# Patient Record
Sex: Female | Born: 1970 | Race: White | Hispanic: No | Smoking: Former smoker
Health system: Southern US, Community
[De-identification: ages and names within clinical notes are randomized; demographics above are authoritative.]

## PROBLEM LIST (undated history)

## (undated) DIAGNOSIS — E785 Hyperlipidemia, unspecified: Secondary | ICD-10-CM

## (undated) DIAGNOSIS — B019 Varicella without complication: Secondary | ICD-10-CM

## (undated) DIAGNOSIS — R112 Nausea with vomiting, unspecified: Secondary | ICD-10-CM

## (undated) DIAGNOSIS — B029 Zoster without complications: Secondary | ICD-10-CM

## (undated) DIAGNOSIS — G8929 Other chronic pain: Secondary | ICD-10-CM

## (undated) DIAGNOSIS — T7840XA Allergy, unspecified, initial encounter: Secondary | ICD-10-CM

## (undated) DIAGNOSIS — K219 Gastro-esophageal reflux disease without esophagitis: Secondary | ICD-10-CM

## (undated) DIAGNOSIS — Z9889 Other specified postprocedural states: Secondary | ICD-10-CM

## (undated) DIAGNOSIS — Z87442 Personal history of urinary calculi: Secondary | ICD-10-CM

## (undated) DIAGNOSIS — G4730 Sleep apnea, unspecified: Secondary | ICD-10-CM

## (undated) HISTORY — DX: Allergy, unspecified, initial encounter: T78.40XA

## (undated) HISTORY — DX: Gastro-esophageal reflux disease without esophagitis: K21.9

## (undated) HISTORY — DX: Personal history of urinary calculi: Z87.442

## (undated) HISTORY — DX: Varicella without complication: B01.9

## (undated) HISTORY — DX: Zoster without complications: B02.9

## (undated) HISTORY — PX: CHOLECYSTECTOMY: SHX55

## (undated) HISTORY — DX: Hyperlipidemia, unspecified: E78.5

## (undated) HISTORY — DX: Other chronic pain: G89.29

---
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
## 2001-01-25 HISTORY — PX: ABDOMINAL HYSTERECTOMY: SHX81

## 2006-01-25 ENCOUNTER — Ambulatory Visit: Payer: Self-pay | Admitting: Unknown Physician Specialty

## 2006-11-15 ENCOUNTER — Ambulatory Visit: Payer: Self-pay | Admitting: Unknown Physician Specialty

## 2007-01-31 LAB — HM PAP SMEAR

## 2007-03-20 LAB — BASIC METABOLIC PANEL
Potassium: 4.1 mmol/L (ref 3.4–5.3)
Sodium: 140 mmol/L (ref 137–147)

## 2007-03-20 LAB — TSH: TSH: 1.68 u[IU]/mL (ref 0.41–5.90)

## 2007-04-13 ENCOUNTER — Ambulatory Visit: Payer: Self-pay | Admitting: Internal Medicine

## 2007-04-20 ENCOUNTER — Ambulatory Visit: Payer: Self-pay | Admitting: Internal Medicine

## 2007-05-25 ENCOUNTER — Ambulatory Visit: Payer: Self-pay | Admitting: General Surgery

## 2007-12-27 ENCOUNTER — Ambulatory Visit: Payer: Self-pay | Admitting: General Surgery

## 2008-07-11 ENCOUNTER — Ambulatory Visit: Payer: Self-pay | Admitting: General Surgery

## 2009-07-28 ENCOUNTER — Ambulatory Visit: Payer: Self-pay | Admitting: Cardiology

## 2010-09-15 ENCOUNTER — Ambulatory Visit: Payer: Self-pay | Admitting: Pain Medicine

## 2010-10-14 ENCOUNTER — Ambulatory Visit: Payer: Self-pay | Admitting: Pain Medicine

## 2011-12-23 ENCOUNTER — Ambulatory Visit: Payer: Self-pay | Admitting: Family Medicine

## 2012-12-10 ENCOUNTER — Other Ambulatory Visit: Payer: Self-pay | Admitting: Physician Assistant

## 2012-12-10 LAB — HEPATIC FUNCTION PANEL A (ARMC)
Bilirubin,Total: 0.3 mg/dL (ref 0.2–1.0)
SGPT (ALT): 18 U/L (ref 12–78)

## 2012-12-21 ENCOUNTER — Ambulatory Visit: Payer: Self-pay | Admitting: Internal Medicine

## 2012-12-26 ENCOUNTER — Ambulatory Visit: Payer: Self-pay | Admitting: Internal Medicine

## 2013-02-28 ENCOUNTER — Ambulatory Visit: Payer: Self-pay | Admitting: Family Medicine

## 2013-02-28 LAB — HM MAMMOGRAPHY

## 2013-05-10 ENCOUNTER — Ambulatory Visit: Payer: Self-pay | Admitting: Physician Assistant

## 2013-05-10 ENCOUNTER — Ambulatory Visit: Payer: Self-pay | Admitting: General Practice

## 2013-05-20 ENCOUNTER — Ambulatory Visit: Payer: Self-pay | Admitting: Urology

## 2013-05-29 ENCOUNTER — Ambulatory Visit: Payer: Self-pay | Admitting: Urology

## 2013-05-30 ENCOUNTER — Ambulatory Visit: Payer: Self-pay | Admitting: Urology

## 2013-06-05 ENCOUNTER — Ambulatory Visit: Payer: Self-pay | Admitting: Urology

## 2014-03-10 ENCOUNTER — Ambulatory Visit: Payer: Self-pay | Admitting: Pain Medicine

## 2014-03-11 ENCOUNTER — Ambulatory Visit: Payer: Self-pay | Admitting: Pain Medicine

## 2014-03-11 LAB — BASIC METABOLIC PANEL
ANION GAP: 9 (ref 7–16)
BUN: 18 mg/dL (ref 7–18)
CO2: 25 mmol/L (ref 21–32)
Calcium, Total: 8.8 mg/dL (ref 8.5–10.1)
Chloride: 107 mmol/L (ref 98–107)
Creatinine: 0.76 mg/dL (ref 0.60–1.30)
EGFR (Non-African Amer.): >60
Glucose: 83 mg/dL (ref 65–99)
Osmolality: 282 (ref 275–301)
POTASSIUM: 4 mmol/L (ref 3.5–5.1)
SODIUM: 141 mmol/L (ref 136–145)

## 2014-03-11 LAB — HEPATIC FUNCTION PANEL A (ARMC)
AST: 10 U/L — AB (ref 15–37)
Albumin: 3.3 g/dL — ABNORMAL LOW (ref 3.4–5.0)
Alkaline Phosphatase: 71 U/L
BILIRUBIN DIRECT: 0.1 mg/dL (ref 0.00–0.20)
BILIRUBIN TOTAL: 0.4 mg/dL (ref 0.2–1.0)
SGPT (ALT): 18 U/L
TOTAL PROTEIN: 6.4 g/dL (ref 6.4–8.2)

## 2014-03-11 LAB — SEDIMENTATION RATE: ERYTHROCYTE SED RATE: 4 mm/h (ref 0–20)

## 2014-03-11 LAB — MAGNESIUM: Magnesium: 1.8 mg/dL

## 2014-04-03 ENCOUNTER — Ambulatory Visit: Payer: Self-pay | Admitting: Pain Medicine

## 2014-06-27 HISTORY — PX: LITHOTRIPSY: SUR834

## 2014-09-24 ENCOUNTER — Encounter: Payer: Self-pay | Admitting: *Deleted

## 2014-10-07 ENCOUNTER — Telehealth: Payer: Self-pay | Admitting: *Deleted

## 2014-10-07 NOTE — Telephone Encounter (Signed)
See message.

## 2015-07-02 ENCOUNTER — Ambulatory Visit: Payer: Self-pay | Admitting: Physician Assistant

## 2015-07-02 ENCOUNTER — Encounter: Payer: Self-pay | Admitting: Physician Assistant

## 2015-07-02 VITALS — BP 122/90 | HR 84 | Temp 97.9°F

## 2015-07-02 DIAGNOSIS — J018 Other acute sinusitis: Secondary | ICD-10-CM

## 2015-07-02 MED ORDER — AMOXICILLIN-POT CLAVULANATE 875-125 MG PO TABS: 1.0000 | ORAL_TABLET | Freq: Two times a day (BID) | ORAL | Status: DC

## 2015-07-02 MED ORDER — PREDNISONE 10 MG PO TABS: 30.0000 mg | ORAL_TABLET | Freq: Every day | ORAL | Status: DC

## 2015-07-02 MED ORDER — FLUCONAZOLE 150 MG PO TABS: ORAL_TABLET | ORAL | Status: DC

## 2015-07-02 NOTE — Progress Notes (Signed)
S: C/o runny nose and congestion for 7 days, no fever, chills, cp/sob, v/d; mucus is green and thick, cough is sporadic, c/o of facial and dental pain. Has some pain on left side, a lot of pressure in face  Using otc meds:   O: PE: vitals wnl, nad, perrl eomi, normocephalic, tms dull, nasal mucosa red and swollen, throat injected, neck supple no lymph, lungs c t a, cv rrr, neuro intact  A:  Acute sinusitis   P: augmentin 875mg  bid , diflucan, prednisone 30mg  qd x 3d, drink fluids, continue regular meds , use otc meds of choice, return if not improving in 5 days, return earlier if worsening

## 2015-08-28 ENCOUNTER — Ambulatory Visit: Payer: Self-pay | Admitting: Nurse Practitioner

## 2015-09-10 ENCOUNTER — Encounter: Payer: Self-pay | Admitting: Nurse Practitioner

## 2015-09-10 ENCOUNTER — Ambulatory Visit (INDEPENDENT_AMBULATORY_CARE_PROVIDER_SITE_OTHER): Payer: 59 | Admitting: Nurse Practitioner

## 2015-09-10 VITALS — BP 110/70 | HR 90 | Temp 98.2°F | Ht 62.0 in | Wt 236.0 lb

## 2015-09-10 DIAGNOSIS — Z9189 Other specified personal risk factors, not elsewhere classified: Secondary | ICD-10-CM

## 2015-09-10 DIAGNOSIS — N200 Calculus of kidney: Secondary | ICD-10-CM

## 2015-09-10 DIAGNOSIS — Z7189 Other specified counseling: Secondary | ICD-10-CM

## 2015-09-10 DIAGNOSIS — Z1239 Encounter for other screening for malignant neoplasm of breast: Secondary | ICD-10-CM

## 2015-09-10 DIAGNOSIS — Z7689 Persons encountering health services in other specified circumstances: Secondary | ICD-10-CM

## 2015-09-10 DIAGNOSIS — K219 Gastro-esophageal reflux disease without esophagitis: Secondary | ICD-10-CM

## 2015-09-10 NOTE — Progress Notes (Signed)
Pre visit review using our clinic review tool, if applicable. No additional management support is needed unless otherwise documented below in the visit note. 

## 2015-09-10 NOTE — Patient Instructions (Addendum)
Welcome to [facility]! Nice to meet you.  See you for physical in May.   [facility]  Address: [address] [place], [place] [address]  Phone: [phone] Hours:  Monday - Thursday: 8 a.m. - 5 p.m. Friday: 8 a.m. - 3 p.m.

## 2015-09-10 NOTE — Progress Notes (Signed)
Patient ID: Sarah [name], female    DOB: [birth year]  Age: 45 y.o. MRN: [id number]  CC: Establish Care   HPI Sarah [name] presents for Establishing care and CC of wanting to get set up for mammogram and colonoscopy.  1) New Pt Info:   Immunizations- unsure of tdap, 03/2015 flu  Mammogram- 2015   Pap- 2015 at [facility] Normal   2) Chronic Problems-   Obesity- lost 10 lbs on her own   2 miles at work being active    Food- cut out fried foods, decrease carbs   Nephrolithiasis- 2014   GERD- Zantac OTC   3) Acute Problems-  Improved sinus infection  Working on weight loss   Needs colonoscopy- cramping next morning after eating dinner and family h/o colon cancer (father)    History Merica has a past medical history of Chicken pox; Allergy; GERD (gastroesophageal reflux disease); Shingles; and History of kidney stones.   She has past surgical history that includes Cesarean section (12/1987); Cesarean section (05/1993); and Abdominal hysterectomy (01/2001).   Her family history includes Breast cancer in her maternal grandmother and mother; Colon cancer in her father; Diabetes in her father; Hypertension in her paternal grandfather; Lung cancer in her father.She reports that she quit smoking about 5 years ago. She has never used smokeless tobacco. She reports that she drinks alcohol. She reports that she does not use illicit drugs.  Outpatient Prescriptions Prior to Visit  Medication Sig Dispense Refill  . amoxicillin-clavulanate (AUGMENTIN) 875-125 MG tablet Take 1 tablet by mouth 2 (two) times daily. 20 tablet 0  . fluconazole (DIFLUCAN) 150 MG tablet Use one now and one in a week 2 tablet 0  . predniSONE (DELTASONE) 10 MG tablet Take 3 tablets (30 mg total) by mouth daily with breakfast. 9 tablet 0   No facility-administered medications prior to visit.    ROS Review of Systems  Constitutional: Negative for fever, chills, diaphoresis and fatigue.  Gastrointestinal: Positive for  abdominal pain. Negative for nausea, vomiting, diarrhea, constipation, blood in stool, abdominal distention, anal bleeding and rectal pain.    Objective:  BP 110/70 mmHg  Pulse 90  Temp(Src) 98.2 F (36.8 C) (Oral)  Ht 5\' 2"  (1.575 m)  Wt 236 lb (107.049 kg)  BMI 43.15 kg/m2  SpO2 98%  Physical Exam  Constitutional: She is oriented to person, place, and time. She appears well-developed and well-nourished. No distress.  HENT:  Head: Normocephalic and atraumatic.  Right Ear: External ear normal.  Left Ear: External ear normal.  Mouth/Throat: Oropharynx is clear and moist. No oropharyngeal exudate.  Eyes: EOM are normal. Pupils are equal, round, and reactive to light. Right eye exhibits no discharge. Left eye exhibits no discharge. No scleral icterus.  Neck: Normal range of motion. Neck supple.  Cardiovascular: Normal rate and regular rhythm.   Pulmonary/Chest: Effort normal and breath sounds normal. No respiratory distress. She has no wheezes. She has no rales. She exhibits no tenderness.  Abdominal: Soft. Bowel sounds are normal. She exhibits no distension and no mass. There is no tenderness. There is no rebound and no guarding.  Neurological: She is alert and oriented to person, place, and time.  Skin: Skin is warm and dry. No rash noted. She is not diaphoretic.  Psychiatric: She has a normal mood and affect. Her behavior is normal. Judgment and thought content normal.   Assessment & Plan:   Jeanni was seen today for establish care.  Diagnoses and all orders for this  visit:  High risk for colon cancer -     Ambulatory referral to Gastroenterology  Screening for breast cancer -     Cancel: MM Digital Screening; Future  Calculus of kidney  Morbid obesity due to excess calories ([place])  Gastroesophageal reflux disease without esophagitis   I have discontinued Ms. Godino's amoxicillin-clavulanate, predniSONE, and fluconazole. I am also having her maintain her loratadine,  ranitidine, magnesium oxide, b complex vitamins, Echinacea, and Melatonin.  Meds ordered this encounter  Medications  . loratadine (CLARITIN) 10 MG tablet    Sig: Take 10 mg by mouth daily.  . ranitidine (ZANTAC) 150 MG capsule    Sig: Take 150 mg by mouth 2 (two) times daily.  . magnesium oxide (MAG-OX) 400 MG tablet    Sig: Take 400 mg by mouth daily.  Marland Kitchen b complex vitamins tablet    Sig: Take 1 tablet by mouth daily.  . Echinacea 400 MG CAPS    Sig: Take 1 capsule by mouth 3 (three) times daily.  . Melatonin 3 MG TABS    Sig: Take 1 tablet by mouth daily.     Follow-up: Return in about 2 months (around 11/12/2015) for CPE.

## 2015-09-11 ENCOUNTER — Encounter: Payer: Self-pay | Admitting: Nurse Practitioner

## 2015-09-18 ENCOUNTER — Ambulatory Visit
Admission: RE | Admit: 2015-09-18 | Discharge: 2015-09-18 | Disposition: A | Discharge status: Home / Self Care | Payer: 59 | Source: Ambulatory Visit | Attending: Nurse Practitioner | Admitting: Nurse Practitioner

## 2015-09-18 ENCOUNTER — Other Ambulatory Visit: Payer: Self-pay | Admitting: Nurse Practitioner

## 2015-09-18 DIAGNOSIS — Z1231 Encounter for screening mammogram for malignant neoplasm of breast: Secondary | ICD-10-CM | POA: Insufficient documentation

## 2015-09-18 DIAGNOSIS — Z1239 Encounter for other screening for malignant neoplasm of breast: Secondary | ICD-10-CM

## 2015-09-20 DIAGNOSIS — K219 Gastro-esophageal reflux disease without esophagitis: Secondary | ICD-10-CM | POA: Insufficient documentation

## 2015-09-20 DIAGNOSIS — Z7689 Persons encountering health services in other specified circumstances: Secondary | ICD-10-CM | POA: Insufficient documentation

## 2015-09-20 HISTORY — DX: Morbid (severe) obesity due to excess calories: E66.01

## 2015-09-20 NOTE — Assessment & Plan Note (Signed)
Mammogram ordered  Info given to pt to call and schedule

## 2015-09-20 NOTE — Assessment & Plan Note (Signed)
Stable currently on zantac otc prn

## 2015-09-20 NOTE — Assessment & Plan Note (Signed)
Pt referred to GI- father had colon cancer pt reports bowel concerns with increased abdominal cramping

## 2015-09-20 NOTE — Assessment & Plan Note (Signed)
Wt Readings from Last 3 Encounters:  09/10/15 236 lb (107.049 kg)   Pt lost 10 lbs recently  She is working on being active and cutting down food intake/carbs/fried foods.  Congratulated her and encouraged her to continue

## 2015-09-20 NOTE — Assessment & Plan Note (Signed)
Discussed acute and chronic issues. Reviewed health maintenance measures, PFSHx, and immunizations. Obtain records from previous facility.   

## 2015-09-20 NOTE — Assessment & Plan Note (Signed)
1 episode in 2014 Advised lemon in drinks and watch sweet tea and sodas

## 2015-09-21 ENCOUNTER — Encounter: Payer: Self-pay | Admitting: Gastroenterology

## 2015-09-21 ENCOUNTER — Encounter: Payer: Self-pay | Admitting: Nurse Practitioner

## 2015-10-28 ENCOUNTER — Encounter: Payer: Self-pay | Admitting: Nurse Practitioner

## 2015-11-17 ENCOUNTER — Ambulatory Visit: Payer: 59 | Attending: Pain Medicine | Admitting: Pain Medicine

## 2015-11-17 ENCOUNTER — Encounter: Payer: Self-pay | Admitting: Pain Medicine

## 2015-11-17 VITALS — BP 128/83 | HR 82 | Temp 98.2°F | Resp 16 | Ht 61.0 in | Wt 200.0 lb

## 2015-11-17 DIAGNOSIS — K219 Gastro-esophageal reflux disease without esophagitis: Secondary | ICD-10-CM | POA: Insufficient documentation

## 2015-11-17 DIAGNOSIS — M25562 Pain in left knee: Secondary | ICD-10-CM | POA: Diagnosis not present

## 2015-11-17 DIAGNOSIS — G8929 Other chronic pain: Secondary | ICD-10-CM | POA: Insufficient documentation

## 2015-11-17 DIAGNOSIS — N200 Calculus of kidney: Secondary | ICD-10-CM | POA: Insufficient documentation

## 2015-11-17 DIAGNOSIS — M7062 Trochanteric bursitis, left hip: Secondary | ICD-10-CM

## 2015-11-17 DIAGNOSIS — M25559 Pain in unspecified hip: Secondary | ICD-10-CM | POA: Diagnosis present

## 2015-11-17 DIAGNOSIS — M25552 Pain in left hip: Secondary | ICD-10-CM | POA: Diagnosis not present

## 2015-11-17 HISTORY — DX: Other chronic pain: G89.29

## 2015-11-17 MED ORDER — METHYLPREDNISOLONE ACETATE 40 MG/ML IJ SUSP
INTRAMUSCULAR | Status: AC
  Filled 2015-11-17: qty 1

## 2015-11-17 MED ORDER — LIDOCAINE HCL (PF) 1 % IJ SOLN: 10.0000 mL | Freq: Once | INTRAMUSCULAR | Status: DC

## 2015-11-17 MED ORDER — ROPIVACAINE HCL 2 MG/ML IJ SOLN
INTRAMUSCULAR | Status: AC
  Filled 2015-11-17: qty 10

## 2015-11-17 MED ORDER — METHYLPREDNISOLONE ACETATE 80 MG/ML IJ SUSP
80.0000 mg | Freq: Once | INTRAMUSCULAR | Status: AC
  Administered 2015-11-17: 80 mg
  Filled 2015-11-17: qty 1

## 2015-11-17 MED ORDER — ROPIVACAINE HCL 2 MG/ML IJ SOLN
5.0000 mL | Freq: Once | INTRAMUSCULAR | Status: AC
  Administered 2015-11-17: 5 mL via EPIDURAL
  Filled 2015-11-17: qty 10

## 2015-11-17 MED ORDER — LIDOCAINE HCL (PF) 1 % IJ SOLN
INTRAMUSCULAR | Status: AC
  Filled 2015-11-17: qty 5

## 2015-11-17 NOTE — Patient Instructions (Signed)

## 2015-11-17 NOTE — Progress Notes (Signed)
Patient's Name: Sarah [name]  Patient type: Established  MRN: [id number]  Service setting: Ambulatory outpatient  DOB: October 04, 1970  Location: [facility]  DOS: 11/17/2015  Primary Care Physician: Wynona Dove, MD  Note by: Sydnee Levans. Laban Emperor, M.D, DABA, DABAPM, DABPM, DABIPP, FIPP  Referring Physician: Shelia Media, MD  Specialty: Board-Certified Interventional Pain Management  Last Visit to Pain Management: Visit date not found   Primary Reason(s) for Visit: Interventional Pain Management Treatment. CC: Hip Pain  Primary Diagnosis: Trochanteric bursitis of left hip [M70.62]   Procedure:  Anesthesia, Analgesia, Anxiolysis:  Type: Diagnostic Greater Trochanteric Bursa Injection Region: Greater Femoral Trochanter Region Level: Hip Joint Laterality: Left  Indications: 1. Trochanteric bursitis of left hip   2. Left knee pain     Pre-procedure Pain Score: 4/10 Reported level of pain is compatible with clinical observations Post-procedure Pain Score: 4   Type: Local Anesthesia Local Anesthetic: Lidocaine 1% Route: Infiltration ([place]/IM) IV Access: Declined Sedation: Declined  Indication(s): Analgesia     Pre-Procedure Assessment:  Sarah [name] is a 45 y.o. year old, female patient, seen today for interventional treatment. She has Calculus of kidney; High risk for colon cancer; Screening for breast cancer; Morbid obesity (HCC); GERD (gastroesophageal reflux disease); Encounter to establish care; Trochanteric bursitis of left hip; and Left knee pain on her problem list.. Her primarily concern today is the Hip Pain   Pain Type: Acute pain Pain Location: Hip Pain Orientation: Left Pain Descriptors / Indicators: Aching, Discomfort, Radiating, Heaviness Pain Frequency: Intermittent    Service Provided on Last Visit: Evaluation  Verification of the correct person, correct site (including marking of site), and correct procedure were performed  and confirmed by the patient.  Consent: Secured. Under the influence of no sedatives a written informed consent was obtained, after having provided information on the risks and possible complications. To fulfill our ethical and legal obligations, as recommended by the American Medical Association's Code of Ethics, we have provided information to the patient about our clinical impression; the nature and purpose of the treatment or procedure; the risks, benefits, and possible complications of the intervention; alternatives; the risk(s) and benefit(s) of the alternative treatment(s) or procedure(s); and the risk(s) and benefit(s) of doing nothing. The patient was provided information about the risks and possible complications associated with the procedure. These include, but are not limited to, failure to achieve desired goals, infection, bleeding, organ or nerve damage, allergic reactions, paralysis, and death. In the case of intra- or periarticular procedures these may include, but are not limited to, failure to achieve desired goals, infection, bleeding (hemarthrosis), organ or nerve damage, allergic reactions, and death. In addition, the patient was informed that Medicine is not an exact science; therefore, there is also the possibility of unforeseen risks and possible complications that may result in a catastrophic outcome. The patient indicated having understood very clearly. We have given the patient no guarantees and we have made no promises. Enough time was given to the patient to ask questions, all of which were answered to the patient's satisfaction.  Consent Attestation: I, the ordering provider, attest that I have discussed with the patient the benefits, risks, side-effects, alternatives, likelihood of achieving goals, and potential problems during recovery for the procedure that I have provided informed consent.  Pre-Procedure Preparation: Safety Precautions: Allergies reviewed. Appropriate site,  procedure, and patient were confirmed by following the Joint Commission's Universal Protocol (UP.01.01.01), in the form of a "Time Out". The patient was asked to  confirm marked site and procedure, before commencing. The patient was asked about blood thinners, or active infections, both of which were denied. Patient was assessed for positional comfort and all pressure points were checked before starting procedure. Allergies: She is allergic to aspirin and sulfa antibiotics.. Infection Control Precautions: Sterile technique used. Standard Universal Precautions were taken as recommended by the Department of [facility] for Disease Control and Prevention (CDC). Standard pre-surgical skin prep was conducted. Respiratory hygiene and cough etiquette was practiced. Hand hygiene observed. Safe injection practices and needle disposal techniques followed. SDV (single dose vial) medications used. Medications properly checked for expiration dates and contaminants. Personal protective equipment (PPE) used: Sterile Radiation-resistant gloves. Monitoring:  As per clinic protocol. Filed Vitals:   11/17/15 0949 11/17/15 0953 11/17/15 0958 11/17/15 1003  BP: 130/82 120/82 126/87 128/83  Pulse: 78 81 71 82  Temp:      TempSrc:      Resp: '16 16 16 16  '$ Height:      Weight:      SpO2: 98% 99% 100% 100%  Calculated BMI: Body mass index is 37.81 kg/(m^2).  Description of Procedure Process:  Time-out: "Time-out" completed before starting procedure, as per protocol. Position: Prone Target Area: Greater Trochanteric Bursa of . Approach: Posterolateral approach. Area Prepped: Entire Posterolateral Hip Area Prepping solution: ChloraPrep (2% chlorhexidine gluconate and 70% isopropyl alcohol) Safety Precautions: Aspiration looking for blood return was conducted prior to all injections. At no point did we inject any substances, as a needle was being advanced. No attempts were made at seeking any paresthesias. Safe  injection practices and needle disposal techniques used. Medications properly checked for expiration dates. SDV (single dose vial) medications used.   Description of the Procedure: Protocol guidelines were followed. The patient was placed in position over the procedure table. The target area was identified and the area prepped in the usual manner. Skin desensitized using vapocoolant spray. Skin & deeper tissues infiltrated with local anesthetic. Appropriate amount of time allowed to pass for local anesthetics to take effect. The procedure needles were then advanced to the target area. Proper needle placement secured. Negative aspiration confirmed. Solution injected in intermittent fashion, asking for systemic symptoms every 0.5cc of injectate. The needles were then removed and the area cleansed, making sure to leave some of the prepping solution back to take advantage of its long term bactericidal properties. EBL: None Materials & Medications Used:  Needle(s) Used: 22g - 7" Spinal Needle(s) Solution Injected: 0.2% PF-Ropivacaine (64m) + SDV-DepoMedrol 80 mg/ml (127m Medications Administered today: We administered methylPREDNISolone acetate and ropivacaine (PF) 2 mg/ml (0.2%).Please see chart orders for dosing details.  Imaging Guidance:  Type of Imaging Technique: Fluoroscopy Guidance (Non-spinal) Indication(s): Assistance in needle guidance and placement for procedures requiring needle placement in or near specific anatomical locations not easily accessible without such assistance. Exposure Time: Please see nurses notes. Contrast: None used. Fluoroscopic Guidance: I was personally present in the fluoroscopy suite, where the patient was placed in position for the procedure, over the fluoroscopy-compatible table. Fluoroscopy was manipulated to obtain the best possible view of the target area, on the affected side. A "direction-depth-direction" technique was used to introduce the needle under continuous  pulsed fluoroscopic guidance. Once the target was reached, antero-posterior, oblique, and lateral fluoroscopic projection views were taken to confirm needle placement in all planes. Permanently recorded images stored by scanning into EMR. Interpretation: Intraoperative imaging interpretation by performing Physician. Adequate needle placement confirmed. Permanent hardcopy images in multiple planes scanned into  the patient's record.  Antibiotic Prophylaxis:  Indication(s): No indications identified. Type:  Antibiotics Given (last 72 hours)    None        Post-operative Assessment:  Complications: No immediate post-treatment complications were observed. Disposition: The patient was discharged home, once institutional criteria were met. Return to clinic in 2 weeks for follow-up evaluation and interpretation of results. The patient tolerated the entire procedure well. A repeat set of vitals were taken after the procedure and the patient was kept under observation following institutional policy, for this type of procedure. Post-procedural neurological assessment was performed, showing return to baseline, prior to discharge. The patient was provided with post-procedure discharge instructions, including a section on how to identify potential problems. Should any problems arise concerning this procedure, the patient was given instructions to immediately contact us, at any time, without hesitation. In any case, we plan to contact the patient by telephone for a follow-up status report regarding this interventional procedure. Comments:  No additional relevant information.  Medications administered during this visit: We administered methylPREDNISolone acetate and ropivacaine (PF) 2 mg/ml (0.2%).  Prescriptions ordered during this visit: New Prescriptions   No medications on file    Future Appointments Date Time Provider [place]  12/07/2015 2:30 PM Jackolyn Confer, MD [facility] None     Primary Care Physician: Rica Mast, MD Location: [facility] Note by: Kathlen Brunswick. Dossie Arbour, M.D, DABA, DABAPM, DABPM, DABIPP, FIPP  Disclaimer:  Medicine is not an exact science. The only guarantee in medicine is that nothing is guaranteed. It is important to note that the decision to proceed with this intervention was based on the information collected from the patient. The Data and conclusions were drawn from the patient's questionnaire, the interview, and the physical examination. Because the information was provided in large part by the patient, it cannot be guaranteed that it has not been purposely or unconsciously manipulated. Every effort has been made to obtain as much relevant data as possible for this evaluation. It is important to note that the conclusions that lead to this procedure are derived in large part from the available data. Always take into account that the treatment will also be dependent on availability of resources and existing treatment guidelines, considered by other Pain Management Practitioners as being common knowledge and practice, at the time of the intervention. For Medico-Legal purposes, it is also important to point out that variation in procedural techniques and pharmacological choices are the acceptable norm. The indications, contraindications, technique, and results of the above procedure should only be interpreted and judged by a Board-Certified Interventional Pain Specialist with extensive familiarity and expertise in the same exact procedure and technique. Attempts at providing opinions without similar or greater experience and expertise than that of the treating physician will be considered as inappropriate and unethical, and shall result in a formal complaint to the state medical board and applicable specialty societies.

## 2015-11-18 ENCOUNTER — Telehealth: Payer: Self-pay | Admitting: *Deleted

## 2015-11-18 NOTE — Telephone Encounter (Signed)
Spoke with patient re; procedures on yesterday, denies any questions or concerns.

## 2015-11-19 ENCOUNTER — Encounter: Payer: Self-pay | Admitting: Nurse Practitioner

## 2015-11-20 ENCOUNTER — Ambulatory Visit: Payer: Self-pay | Admitting: Gastroenterology

## 2015-11-24 DIAGNOSIS — Z800 Family history of malignant neoplasm of digestive organs: Secondary | ICD-10-CM | POA: Diagnosis not present

## 2015-11-24 DIAGNOSIS — Z1211 Encounter for screening for malignant neoplasm of colon: Secondary | ICD-10-CM | POA: Diagnosis not present

## 2015-12-07 ENCOUNTER — Ambulatory Visit (INDEPENDENT_AMBULATORY_CARE_PROVIDER_SITE_OTHER): Payer: 59 | Admitting: Internal Medicine

## 2015-12-07 ENCOUNTER — Encounter: Payer: Self-pay | Admitting: Internal Medicine

## 2015-12-07 VITALS — BP 106/70 | HR 97 | Ht 61.0 in | Wt 236.0 lb

## 2015-12-07 DIAGNOSIS — Z0000 Encounter for general adult medical examination without abnormal findings: Secondary | ICD-10-CM | POA: Diagnosis not present

## 2015-12-07 NOTE — Progress Notes (Signed)
Subjective:    Patient ID: Sarah [name], female    DOB: September 07, 1970, 45 y.o.   MRN: [id number]  HPI  45YO female presents for physical and to establish care.  Scheduled with Dr. Tiffany Kocher for colonoscopy. Feeling well. No concerns today. Trying to get back on track with diet and lose weight.  Wt Readings from Last 3 Encounters:  12/07/15 236 lb (107.049 kg)  11/17/15 200 lb (90.719 kg)  09/10/15 236 lb (107.049 kg)   BP Readings from Last 3 Encounters:  12/07/15 106/70  11/17/15 128/83  09/10/15 110/70    Past Medical History  Diagnosis Date  . Chicken pox   . Allergy   . GERD (gastroesophageal reflux disease)   . Shingles   . History of kidney stones    Family History  Problem Relation Age of Onset  . Breast cancer Mother   . Colon cancer Father   . Lung cancer Father   . Diabetes Father   . Hypertension Paternal Grandfather   . Breast cancer Maternal Grandmother    Past Surgical History  Procedure Laterality Date  . Cesarean section  12/1987  . Cesarean section  05/1993  . Abdominal hysterectomy  01/2001    partial   Social History   Social History  . Marital Status: Married    Spouse Name: N/A  . Number of Children: N/A  . Years of Education: N/A   Social History Main Topics  . Smoking status: Former Smoker    Quit date: 06/27/2010  . Smokeless tobacco: Never Used     Comment: quit 2015---currently using e cig  . Alcohol Use: 0.0 oz/week    0 Standard drinks or equivalent per week     Comment: occasional  . Drug Use: No  . Sexual Activity:    Partners: Male    Birth Control/ Protection: Surgical     Comment: Husband   Other Topics Concern  . None   Social History Narrative   Married   Employed as a Chartered certified accountant at [employer]   HS education    2 children (daughter 31 and son 58)    Caffeine- Coffee and tea 4-5 cups daily     Review of Systems  Constitutional: Negative for fever, chills, appetite change, fatigue and unexpected  weight change.  Eyes: Negative for visual disturbance.  Respiratory: Negative for shortness of breath.   Cardiovascular: Negative for chest pain and leg swelling.  Gastrointestinal: Negative for nausea, vomiting, abdominal pain, diarrhea and constipation.  Skin: Negative for color change and rash.  Hematological: Negative for adenopathy. Does not bruise/bleed easily.  Psychiatric/Behavioral: Negative for dysphoric mood. The patient is not nervous/anxious.        Objective:    BP 106/70 mmHg  Pulse 97  Ht 5\' 1"  (1.549 m)  Wt 236 lb (107.049 kg)  BMI 44.61 kg/m2  SpO2 99% Physical Exam  Constitutional: She is oriented to person, place, and time. She appears well-developed and well-nourished. No distress.  HENT:  Head: Normocephalic and atraumatic.  Right Ear: External ear normal.  Left Ear: External ear normal.  Nose: Nose normal.  Mouth/Throat: Oropharynx is clear and moist. No oropharyngeal exudate.  Eyes: Conjunctivae are normal. Pupils are equal, round, and reactive to light. Right eye exhibits no discharge. Left eye exhibits no discharge. No scleral icterus.  Neck: Normal range of motion. Neck supple. No tracheal deviation present. No thyromegaly present.  Cardiovascular: Normal rate, regular rhythm, normal heart sounds and intact  distal pulses.  Exam reveals no gallop and no friction rub.   No murmur heard. Pulmonary/Chest: Effort normal and breath sounds normal. No accessory muscle usage. No tachypnea. No respiratory distress. She has no decreased breath sounds. She has no wheezes. She has no rales. She exhibits no tenderness. Right breast exhibits no inverted nipple, no mass, no nipple discharge, no skin change and no tenderness. Left breast exhibits no inverted nipple, no mass, no nipple discharge, no skin change and no tenderness. Breasts are symmetrical.  Abdominal: Soft. Bowel sounds are normal. She exhibits no distension and no mass. There is no tenderness. There is no  rebound and no guarding.  Musculoskeletal: Normal range of motion. She exhibits no edema or tenderness.  Lymphadenopathy:    She has no cervical adenopathy.  Neurological: She is alert and oriented to person, place, and time. No cranial nerve deficit. She exhibits normal muscle tone. Coordination normal.  Skin: Skin is warm and dry. No rash noted. She is not diaphoretic. No erythema. No pallor.  Psychiatric: She has a normal mood and affect. Her behavior is normal. Judgment and thought content normal.          Assessment & Plan:   Problem List Items Addressed This Visit      Unprioritized   Routine general medical examination at a health care facility - Primary    General medical exam normal today including breast exam. PAP and pelvic deferred as s/p hysterectomy. Mammogram reviewed.  Labs ordered. Encouraged healthy diet and exercise for weight loss. Discussed using Saxenda. She will look into coverage for this.      Relevant Orders   CBC with Differential/Platelet   Comprehensive metabolic panel   Lipid panel   VITAMIN D 25 Hydroxy (Vit-D Deficiency, Fractures)   TSH   Hemoglobin A1c       Return in about 1 year (around 12/06/2016) for Physical.  Ronette Deter, MD Internal Medicine [facility]

## 2015-12-07 NOTE — Assessment & Plan Note (Signed)
General medical exam normal today including breast exam. PAP and pelvic deferred as s/p hysterectomy. Mammogram reviewed.  Labs ordered. Encouraged healthy diet and exercise for weight loss. Discussed using Saxenda. She will look into coverage for this.

## 2015-12-07 NOTE — Progress Notes (Signed)
Pre visit review using our clinic review tool, if applicable. No additional management support is needed unless otherwise documented below in the visit note. 

## 2015-12-07 NOTE — Patient Instructions (Signed)
Health Maintenance, Female Adopting a healthy lifestyle and getting preventive care can go a long way to promote health and wellness. Talk with your health care provider about what schedule of regular examinations is right for you. This is a good chance for you to check in with your provider about disease prevention and staying healthy. In between checkups, there are plenty of things you can do on your own. Experts have done a lot of research about which lifestyle changes and preventive measures are most likely to keep you healthy. Ask your health care provider for more information. WEIGHT AND DIET  Eat a healthy diet  Be sure to include plenty of vegetables, fruits, low-fat dairy products, and lean protein.  Do not eat a lot of foods high in solid fats, added sugars, or salt.  Get regular exercise. This is one of the most important things you can do for your health.  Most adults should exercise for at least 150 minutes each week. The exercise should increase your heart rate and make you sweat (moderate-intensity exercise).  Most adults should also do strengthening exercises at least twice a week. This is in addition to the moderate-intensity exercise.  Maintain a healthy weight  Body mass index (BMI) is a measurement that can be used to identify possible weight problems. It estimates body fat based on height and weight. Your health care provider can help determine your BMI and help you achieve or maintain a healthy weight.  For females 20 years of age and older:   A BMI below 18.5 is considered underweight.  A BMI of 18.5 to 24.9 is normal.  A BMI of 25 to 29.9 is considered overweight.  A BMI of 30 and above is considered obese.  Watch levels of cholesterol and blood lipids  You should start having your blood tested for lipids and cholesterol at 47 years of age, then have this test every 5 years.  You may need to have your cholesterol levels checked more often if:  Your lipid  or cholesterol levels are high.  You are older than 47 years of age.  You are at high risk for heart disease.  CANCER SCREENING   Lung Cancer  Lung cancer screening is recommended for adults 55-80 years old who are at high risk for lung cancer because of a history of smoking.  A yearly low-dose CT scan of the lungs is recommended for people who:  Currently smoke.  Have quit within the past 15 years.  Have at least a 30-pack-year history of smoking. A pack year is smoking an average of one pack of cigarettes a day for 1 year.  Yearly screening should continue until it has been 15 years since you quit.  Yearly screening should stop if you develop a health problem that would prevent you from having lung cancer treatment.  Breast Cancer  Practice breast self-awareness. This means understanding how your breasts normally appear and feel.  It also means doing regular breast self-exams. Let your health care provider know about any changes, no matter how small.  If you are in your 20s or 30s, you should have a clinical breast exam (CBE) by a health care provider every 1-3 years as part of a regular health exam.  If you are 40 or older, have a CBE every year. Also consider having a breast X-ray (mammogram) every year.  If you have a family history of breast cancer, talk to your health care provider about genetic screening.  If you   are at high risk for breast cancer, talk to your health care provider about having an MRI and a mammogram every year.  Breast cancer gene (BRCA) assessment is recommended for women who have family members with BRCA-related cancers. BRCA-related cancers include:  Breast.  Ovarian.  Tubal.  Peritoneal cancers.  Results of the assessment will determine the need for genetic counseling and BRCA1 and BRCA2 testing. Cervical Cancer Your health care provider may recommend that you be screened regularly for cancer of the pelvic organs (ovaries, uterus, and  vagina). This screening involves a pelvic examination, including checking for microscopic changes to the surface of your cervix (Pap test). You may be encouraged to have this screening done every 3 years, beginning at age 21.  For women ages 30-65, health care providers may recommend pelvic exams and Pap testing every 3 years, or they may recommend the Pap and pelvic exam, combined with testing for human papilloma virus (HPV), every 5 years. Some types of HPV increase your risk of cervical cancer. Testing for HPV may also be done on women of any age with unclear Pap test results.  Other health care providers may not recommend any screening for nonpregnant women who are considered low risk for pelvic cancer and who do not have symptoms. Ask your health care provider if a screening pelvic exam is right for you.  If you have had past treatment for cervical cancer or a condition that could lead to cancer, you need Pap tests and screening for cancer for at least 20 years after your treatment. If Pap tests have been discontinued, your risk factors (such as having a new sexual partner) need to be reassessed to determine if screening should resume. Some women have medical problems that increase the chance of getting cervical cancer. In these cases, your health care provider may recommend more frequent screening and Pap tests. Colorectal Cancer  This type of cancer can be detected and often prevented.  Routine colorectal cancer screening usually begins at 47 years of age and continues through 47 years of age.  Your health care provider may recommend screening at an earlier age if you have risk factors for colon cancer.  Your health care provider may also recommend using home test kits to check for hidden blood in the stool.  A small camera at the end of a tube can be used to examine your colon directly (sigmoidoscopy or colonoscopy). This is done to check for the earliest forms of colorectal  cancer.  Routine screening usually begins at age 50.  Direct examination of the colon should be repeated every 5-10 years through 47 years of age. However, you may need to be screened more often if early forms of precancerous polyps or small growths are found. Skin Cancer  Check your skin from head to toe regularly.  Tell your health care provider about any new moles or changes in moles, especially if there is a change in a mole's shape or color.  Also tell your health care provider if you have a mole that is larger than the size of a pencil eraser.  Always use sunscreen. Apply sunscreen liberally and repeatedly throughout the day.  Protect yourself by wearing long sleeves, pants, a wide-brimmed hat, and sunglasses whenever you are outside. HEART DISEASE, DIABETES, AND HIGH BLOOD PRESSURE   High blood pressure causes heart disease and increases the risk of stroke. High blood pressure is more likely to develop in:  People who have blood pressure in the high end   of the normal range (130-139/85-89 mm Hg).  People who are overweight or obese.  People who are African American.  If you are 38-23 years of age, have your blood pressure checked every 3-5 years. If you are 61 years of age or older, have your blood pressure checked every year. You should have your blood pressure measured twice--once when you are at a hospital or clinic, and once when you are not at a hospital or clinic. Record the average of the two measurements. To check your blood pressure when you are not at a hospital or clinic, you can use:  An automated blood pressure machine at a pharmacy.  A home blood pressure monitor.  If you are between 45 years and 39 years old, ask your health care provider if you should take aspirin to prevent strokes.  Have regular diabetes screenings. This involves taking a blood sample to check your fasting blood sugar level.  If you are at a normal weight and have a low risk for diabetes,  have this test once every three years after 47 years of age.  If you are overweight and have a high risk for diabetes, consider being tested at a younger age or more often. PREVENTING INFECTION  Hepatitis B  If you have a higher risk for hepatitis B, you should be screened for this virus. You are considered at high risk for hepatitis B if:  You were born in a country where hepatitis B is common. Ask your health care provider which countries are considered high risk.  Your parents were born in a high-risk country, and you have not been immunized against hepatitis B (hepatitis B vaccine).  You have HIV or AIDS.  You use needles to inject street drugs.  You live with someone who has hepatitis B.  You have had sex with someone who has hepatitis B.  You get hemodialysis treatment.  You take certain medicines for conditions, including cancer, organ transplantation, and autoimmune conditions. Hepatitis C  Blood testing is recommended for:  Everyone born from [birth year] through 1967.  Anyone with known risk factors for hepatitis C. Sexually transmitted infections (STIs)  You should be screened for sexually transmitted infections (STIs) including gonorrhea and chlamydia if:  You are sexually active and are younger than 47 years of age.  You are older than 47 years of age and your health care provider tells you that you are at risk for this type of infection.  Your sexual activity has changed since you were last screened and you are at an increased risk for chlamydia or gonorrhea. Ask your health care provider if you are at risk.  If you do not have HIV, but are at risk, it may be recommended that you take a prescription medicine daily to prevent HIV infection. This is called pre-exposure prophylaxis (PrEP). You are considered at risk if:  You are sexually active and do not regularly use condoms or know the HIV status of your partner(s).  You take drugs by injection.  You are sexually  active with a partner who has HIV. Talk with your health care provider about whether you are at high risk of being infected with HIV. If you choose to begin PrEP, you should first be tested for HIV. You should then be tested every 3 months for as long as you are taking PrEP.  PREGNANCY   If you are premenopausal and you may become pregnant, ask your health care provider about preconception counseling.  If you may  become pregnant, take 400 to 800 micrograms (mcg) of folic acid every day.  If you want to prevent pregnancy, talk to your health care provider about birth control (contraception). OSTEOPOROSIS AND MENOPAUSE   Osteoporosis is a disease in which the bones lose minerals and strength with aging. This can result in serious bone fractures. Your risk for osteoporosis can be identified using a bone density scan.  If you are 61 years of age or older, or if you are at risk for osteoporosis and fractures, ask your health care provider if you should be screened.  Ask your health care provider whether you should take a calcium or vitamin D supplement to lower your risk for osteoporosis.  Menopause may have certain physical symptoms and risks.  Hormone replacement therapy may reduce some of these symptoms and risks. Talk to your health care provider about whether hormone replacement therapy is right for you.  HOME CARE INSTRUCTIONS   Schedule regular health, dental, and eye exams.  Stay current with your immunizations.   Do not use any tobacco products including cigarettes, chewing tobacco, or electronic cigarettes.  If you are pregnant, do not drink alcohol.  If you are breastfeeding, limit how much and how often you drink alcohol.  Limit alcohol intake to no more than 1 drink per day for nonpregnant women. One drink equals 12 ounces of beer, 5 ounces of wine, or 1 ounces of hard liquor.  Do not use street drugs.  Do not share needles.  Ask your health care provider for help if  you need support or information about quitting drugs.  Tell your health care provider if you often feel depressed.  Tell your health care provider if you have ever been abused or do not feel safe at home.   This information is not intended to replace advice given to you by your health care provider. Make sure you discuss any questions you have with your health care provider.   Document Released: 12/27/2010 Document Revised: 07/04/2014 Document Reviewed: 05/15/2013 Elsevier Interactive Patient Education [employer].

## 2016-01-22 ENCOUNTER — Ambulatory Visit: Payer: 59 | Admitting: Physician Assistant

## 2016-01-22 ENCOUNTER — Encounter: Payer: Self-pay | Admitting: Physician Assistant

## 2016-01-22 VITALS — BP 110/80 | HR 80 | Temp 98.1°F

## 2016-01-22 DIAGNOSIS — R1032 Left lower quadrant pain: Secondary | ICD-10-CM

## 2016-01-22 MED ORDER — CIPROFLOXACIN HCL 500 MG PO TABS: 500.0000 mg | ORAL_TABLET | Freq: Two times a day (BID) | ORAL | 0 refills | Status: DC

## 2016-01-22 MED ORDER — METRONIDAZOLE 500 MG PO TABS
500.0000 mg | ORAL_TABLET | Freq: Three times a day (TID) | ORAL | 0 refills | Status: DC
Start: 2016-01-22 — End: 2016-01-29

## 2016-01-22 NOTE — Progress Notes (Signed)
S: c/o llq pain, states it happens every time she eats popcorn or seeds, stools are mushy, no actual diarrhea or constipation, some have been tarry, states she is scheduled for a colonoscopy next week, just has been in a lot of pain all week  O: vitals wnl, nad, abd soft tender in llq, bs normal all 4 quads, n/v intact  A: llq pain  P: cipro 500mg  bid x7 d, flagyl 500mg  tid x 7d, notify GI doctor of medications prior to colonoscopy

## 2016-01-25 ENCOUNTER — Other Ambulatory Visit: Payer: Self-pay

## 2016-01-26 ENCOUNTER — Other Ambulatory Visit: Payer: Self-pay | Admitting: Pain Medicine

## 2016-01-26 ENCOUNTER — Telehealth: Payer: Self-pay | Admitting: Physician Assistant

## 2016-01-26 ENCOUNTER — Ambulatory Visit
Admission: RE | Admit: 2016-01-26 | Discharge: 2016-01-26 | Disposition: A | Discharge status: Home / Self Care | Payer: 59 | Source: Ambulatory Visit | Attending: Pain Medicine | Admitting: Pain Medicine

## 2016-01-26 DIAGNOSIS — M545 Low back pain, unspecified: Secondary | ICD-10-CM

## 2016-01-26 DIAGNOSIS — R109 Unspecified abdominal pain: Secondary | ICD-10-CM | POA: Diagnosis not present

## 2016-01-26 DIAGNOSIS — Z87442 Personal history of urinary calculi: Secondary | ICD-10-CM | POA: Insufficient documentation

## 2016-01-26 NOTE — Telephone Encounter (Signed)
Spoke with Sarah [name] who instructed me to inform patient that she will need to contact GI who is performing her colonoscopy to see if a ultrasound or CT scan is advisable . Per patient  informed her of this also asked patient if she informed her GI of antibiotic  that was given to her she stated No didn't know she had to. Also patient stated that she will get a provider to order her a CT or Ultrasounnd

## 2016-01-26 NOTE — Progress Notes (Signed)
The patient has a complaint of left lower back pain which started last Tuesday (one week ago) and its referred to the left flank and groin area. She has a history of nephrolithiasis and therefore we will repeat a KUB.

## 2016-01-28 ENCOUNTER — Encounter: Payer: Self-pay | Admitting: *Deleted

## 2016-01-29 ENCOUNTER — Encounter
Admission: RE | Disposition: A | Discharge status: Home / Self Care | Payer: Self-pay | Source: Ambulatory Visit | Attending: Unknown Physician Specialty

## 2016-01-29 ENCOUNTER — Ambulatory Visit: Payer: 59 | Admitting: Anesthesiology

## 2016-01-29 ENCOUNTER — Encounter: Payer: Self-pay | Admitting: *Deleted

## 2016-01-29 ENCOUNTER — Ambulatory Visit
Admission: RE | Admit: 2016-01-29 | Discharge: 2016-01-29 | Disposition: A | Discharge status: Home / Self Care | Payer: 59 | Source: Ambulatory Visit | Attending: Unknown Physician Specialty | Admitting: Unknown Physician Specialty

## 2016-01-29 DIAGNOSIS — K5790 Diverticulosis of intestine, part unspecified, without perforation or abscess without bleeding: Secondary | ICD-10-CM | POA: Diagnosis not present

## 2016-01-29 DIAGNOSIS — K219 Gastro-esophageal reflux disease without esophagitis: Secondary | ICD-10-CM | POA: Diagnosis not present

## 2016-01-29 DIAGNOSIS — Z87891 Personal history of nicotine dependence: Secondary | ICD-10-CM | POA: Diagnosis not present

## 2016-01-29 DIAGNOSIS — Z79899 Other long term (current) drug therapy: Secondary | ICD-10-CM | POA: Insufficient documentation

## 2016-01-29 DIAGNOSIS — Z1211 Encounter for screening for malignant neoplasm of colon: Secondary | ICD-10-CM | POA: Diagnosis not present

## 2016-01-29 DIAGNOSIS — Z800 Family history of malignant neoplasm of digestive organs: Secondary | ICD-10-CM | POA: Diagnosis not present

## 2016-01-29 DIAGNOSIS — K5730 Diverticulosis of large intestine without perforation or abscess without bleeding: Secondary | ICD-10-CM | POA: Diagnosis not present

## 2016-01-29 DIAGNOSIS — K640 First degree hemorrhoids: Secondary | ICD-10-CM | POA: Diagnosis not present

## 2016-01-29 DIAGNOSIS — Z87442 Personal history of urinary calculi: Secondary | ICD-10-CM | POA: Insufficient documentation

## 2016-01-29 DIAGNOSIS — K635 Polyp of colon: Secondary | ICD-10-CM | POA: Diagnosis not present

## 2016-01-29 DIAGNOSIS — Z882 Allergy status to sulfonamides status: Secondary | ICD-10-CM | POA: Insufficient documentation

## 2016-01-29 DIAGNOSIS — K648 Other hemorrhoids: Secondary | ICD-10-CM | POA: Diagnosis not present

## 2016-01-29 DIAGNOSIS — D123 Benign neoplasm of transverse colon: Secondary | ICD-10-CM | POA: Insufficient documentation

## 2016-01-29 HISTORY — PX: COLONOSCOPY WITH PROPOFOL: SHX5780

## 2016-01-29 SURGERY — COLONOSCOPY WITH PROPOFOL
Anesthesia: General

## 2016-01-29 MED ORDER — FENTANYL CITRATE (PF) 100 MCG/2ML IJ SOLN
INTRAMUSCULAR | Status: DC | PRN
  Administered 2016-01-29: 50 ug via INTRAVENOUS

## 2016-01-29 MED ORDER — MIDAZOLAM HCL 5 MG/5ML IJ SOLN
INTRAMUSCULAR | Status: DC | PRN
  Administered 2016-01-29: 1 mg via INTRAVENOUS

## 2016-01-29 MED ORDER — PROPOFOL 500 MG/50ML IV EMUL
INTRAVENOUS | Status: DC | PRN
  Administered 2016-01-29: 160 ug/kg/min via INTRAVENOUS

## 2016-01-29 MED ORDER — PROPOFOL 10 MG/ML IV BOLUS
INTRAVENOUS | Status: DC | PRN
  Administered 2016-01-29: 100 mg via INTRAVENOUS

## 2016-01-29 MED ORDER — SODIUM CHLORIDE 0.9 % IV SOLN
INTRAVENOUS | Status: DC
  Administered 2016-01-29: 1000 mL via INTRAVENOUS

## 2016-01-29 MED ORDER — LIDOCAINE 2% (20 MG/ML) 5 ML SYRINGE
INTRAMUSCULAR | Status: DC | PRN
  Administered 2016-01-29: 40 mg via INTRAVENOUS

## 2016-01-29 MED ORDER — SODIUM CHLORIDE 0.9 % IV SOLN
INTRAVENOUS | Status: DC
Start: 2016-01-29 — End: 2016-01-29

## 2016-01-29 NOTE — Transfer of Care (Signed)
Immediate Anesthesia Transfer of Care Note  Patient: Sarah [name]  Procedure(s) Performed: Procedure(s): COLONOSCOPY WITH PROPOFOL (N/A)  Patient Location: PACU and Endoscopy Unit  Anesthesia Type:General  Level of Consciousness: awake, oriented and patient cooperative  Airway & Oxygen Therapy: Patient Spontanous Breathing and Patient connected to nasal cannula oxygen  Post-op Assessment: Report given to RN and Post -op Vital signs reviewed and stable  Post vital signs: Reviewed and stable  Last Vitals:  Vitals:   01/29/16 0845  BP: 128/90  Pulse: 84  Resp: 14  Temp: 36.9 C    Last Pain:  Vitals:   01/29/16 0845  TempSrc: Tympanic         Complications: No apparent anesthesia complications

## 2016-01-29 NOTE — Anesthesia Postprocedure Evaluation (Signed)
Anesthesia Post Note  Patient: Sarah [name]  Procedure(s) Performed: Procedure(s) (LRB): COLONOSCOPY WITH PROPOFOL (N/A)  Patient location during evaluation: PACU Anesthesia Type: General Level of consciousness: awake Pain management: pain level controlled Vital Signs Assessment: post-procedure vital signs reviewed and stable Respiratory status: spontaneous breathing Cardiovascular status: stable Anesthetic complications: no    Last Vitals:  Vitals:   01/29/16 1007 01/29/16 1017  BP: 112/63 110/65  Pulse: 74 72  Resp: 15 13  Temp:      Last Pain:  Vitals:   01/29/16 0957  TempSrc: Oral  PainSc: Asleep                 VAN STAVEREN,Aaniya Sterba

## 2016-01-29 NOTE — Op Note (Signed)
[facility] Gastroenterology Patient Name: Sarah [name] Procedure Date: 01/29/2016 9:31 AM MRN: [id number] Account #: [id number] Date of Birth: July 01, 1970 Admit Type: Outpatient Age: 45 Room: [facility] ENDO ROOM 1 Gender: Female Note Status: Finalized Procedure:            Colonoscopy Indications:          Screening for colorectal malignant neoplasm Providers:            Manya Silvas, MD Referring MD:         Eduard Clos. Gilford Rile, MD (Referring MD) Medicines:            Propofol per Anesthesia Complications:        No immediate complications. Procedure:            Pre-Anesthesia Assessment:                       - After reviewing the risks and benefits, the patient                        was deemed in satisfactory condition to undergo the                        procedure.                       After obtaining informed consent, the colonoscope was                        passed under direct vision. Throughout the procedure,                        the patient's blood pressure, pulse, and oxygen                        saturations were monitored continuously. The                        Colonoscope was introduced through the anus and                        advanced to the the cecum, identified by appendiceal                        orifice and ileocecal valve. The colonoscopy was                        performed without difficulty. The patient tolerated the                        procedure well. The quality of the bowel preparation                        was excellent. Findings:      Two sessile polyps were found in the proximal transverse colon. The       polyps were small in size. These polyps were removed with a hot snare.       Resection and retrieval were complete.      Multiple small and large-mouthed diverticula were found in the sigmoid       colon, descending colon, transverse colon and ascending colon.      Internal  hemorrhoids were found during endoscopy. The  hemorrhoids were       small and Grade I (internal hemorrhoids that do not prolapse).      The exam was otherwise without abnormality. Impression:           - Two small polyps in the proximal transverse colon,                        removed with a hot snare. Resected and retrieved.                       - Diverticulosis in the sigmoid colon, in the                        descending colon, in the transverse colon and in the                        ascending colon.                       - Internal hemorrhoids.                       - The examination was otherwise normal. Recommendation:       - Await pathology results.                       - Resume previous diet indefinitely. Manya Silvas, MD 01/29/2016 9:53:01 AM This report has been signed electronically. Number of Addenda: 0 Note Initiated On: 01/29/2016 9:31 AM Scope Withdrawal Time: 0 hours 10 minutes 22 seconds  Total Procedure Duration: 0 hours 13 minutes 58 seconds       [facility]

## 2016-01-29 NOTE — Anesthesia Preprocedure Evaluation (Signed)
Anesthesia Evaluation  Patient identified by MRN, date of birth, ID band Patient awake    Reviewed: Allergy & Precautions, NPO status , Patient's Chart, lab work & pertinent test results  History of Anesthesia Complications (+) PONV  Airway Mallampati: II       Dental  (+) Teeth Intact   Pulmonary neg pulmonary ROS, former smoker,     + decreased breath sounds      Cardiovascular Exercise Tolerance: Good  Rhythm:Regular     Neuro/Psych    GI/Hepatic GERD  ,Patient received Oral Contrast Agents,  Endo/Other  Morbid obesity  Renal/GU      Musculoskeletal negative musculoskeletal ROS (+)   Abdominal (+) + obese,   Peds  Hematology   Anesthesia Other Findings   Reproductive/Obstetrics                             Anesthesia Physical Anesthesia Plan  ASA: II  Anesthesia Plan: General   Post-op Pain Management:    Induction: Intravenous  Airway Management Planned: Natural Airway and Nasal Cannula  Additional Equipment:   Intra-op Plan:   Post-operative Plan:   Informed Consent: I have reviewed the patients History and Physical, chart, labs and discussed the procedure including the risks, benefits and alternatives for the proposed anesthesia with the patient or authorized representative who has indicated his/her understanding and acceptance.     Plan Discussed with: CRNA  Anesthesia Plan Comments:         Anesthesia Quick Evaluation

## 2016-01-29 NOTE — H&P (Signed)
Primary Care Physician:  Rica Mast, MD Primary Gastroenterologist:  Dr. Vira Agar  Pre-Procedure History & Physical: HPI:  Sarah [name] is a 45 y.o. female is here for an colonoscopy.   Past Medical History:  Diagnosis Date  . Allergy   . Chicken pox   . GERD (gastroesophageal reflux disease)   . History of kidney stones   . Shingles     Past Surgical History:  Procedure Laterality Date  . ABDOMINAL HYSTERECTOMY  01/2001   partial  . CESAREAN SECTION  12/1987  . CESAREAN SECTION  05/1993    Prior to Admission medications   Medication Sig Start Date End Date Taking? Authorizing Provider  b complex vitamins tablet Take 1 tablet by mouth daily.    Historical Provider, MD  ciprofloxacin (CIPRO) 500 MG tablet Take 1 tablet (500 mg total) by mouth 2 (two) times daily. 01/22/16   Versie Starks, PA-C  loratadine (CLARITIN) 10 MG tablet Take 10 mg by mouth daily.    Historical Provider, MD  magnesium oxide (MAG-OX) 400 MG tablet Take 400 mg by mouth daily.    Historical Provider, MD  Melatonin 3 MG TABS Take 1 tablet by mouth daily.    Historical Provider, MD  polyethylene glycol powder (GLYCOLAX/MIRALAX) powder  11/24/15   Historical Provider, MD  ranitidine (ZANTAC) 150 MG capsule Take 150 mg by mouth 2 (two) times daily.    Historical Provider, MD    Allergies as of 01/04/2016 - Review Complete 12/07/2015  Allergen Reaction Noted  . Aspirin  11/22/2012  . Sulfa antibiotics  11/22/2012    Family History  Problem Relation Age of Onset  . Breast cancer Mother   . Colon cancer Father   . Lung cancer Father   . Diabetes Father   . Hypertension Paternal Grandfather   . Breast cancer Maternal Grandmother     Social History   Social History  . Marital status: Married    Spouse name: N/A  . Number of children: N/A  . Years of education: N/A   Occupational History  . Not on file.   Social History Main Topics  . Smoking status: Former Smoker    Quit date:  06/27/2010  . Smokeless tobacco: Never Used     Comment: quit 2015---currently using e cig  . Alcohol use 0.0 oz/week     Comment: occasional  . Drug use: No  . Sexual activity: Yes    Partners: Male    Birth control/ protection: Surgical     Comment: Husband   Other Topics Concern  . Not on file   Social History Narrative   Married   Employed as a Chartered certified accountant at [employer]   HS education    2 children (daughter 93 and son 74)    Caffeine- Coffee and tea 4-5 cups daily     Review of Systems: See HPI, otherwise negative ROS  Physical Exam: BP 128/90   Pulse 84   Temp 98.4 F (36.9 C) (Tympanic)   Resp 14   Ht 5' 1.5" (1.562 m)   Wt 100.2 kg (221 lb)   SpO2 100%   BMI 41.08 kg/m  General:   Alert,  pleasant and cooperative in NAD Head:  Normocephalic and atraumatic. Neck:  Supple; no masses or thyromegaly. Lungs:  Clear throughout to auscultation.    Heart:  Regular rate and rhythm. Abdomen:  Soft, nontender and nondistended. Normal bowel sounds, without guarding, and without rebound.   Neurologic:  Alert and  oriented x4;  grossly normal neurologically.  Impression/Plan: Sarah [name] is here for an colonoscopy to be performed for screening  Risks, benefits, limitations, and alternatives regarding  colonoscopy have been reviewed with the patient.  Questions have been answered.  All parties agreeable.   Gaylyn Cheers, MD  01/29/2016, 9:30 AM

## 2016-02-01 ENCOUNTER — Encounter: Payer: Self-pay | Admitting: Unknown Physician Specialty

## 2016-02-01 LAB — SURGICAL PATHOLOGY

## 2016-03-15 ENCOUNTER — Encounter: Payer: Self-pay | Admitting: Pain Medicine

## 2016-03-15 ENCOUNTER — Ambulatory Visit: Payer: 59 | Attending: Pain Medicine | Admitting: Pain Medicine

## 2016-03-15 VITALS — BP 124/88 | HR 99 | Temp 98.2°F | Resp 28 | Ht 62.0 in | Wt 200.0 lb

## 2016-03-15 DIAGNOSIS — N200 Calculus of kidney: Secondary | ICD-10-CM | POA: Insufficient documentation

## 2016-03-15 DIAGNOSIS — Z87442 Personal history of urinary calculi: Secondary | ICD-10-CM | POA: Diagnosis not present

## 2016-03-15 DIAGNOSIS — M25562 Pain in left knee: Secondary | ICD-10-CM | POA: Diagnosis not present

## 2016-03-15 DIAGNOSIS — M7062 Trochanteric bursitis, left hip: Secondary | ICD-10-CM | POA: Diagnosis not present

## 2016-03-15 DIAGNOSIS — G8929 Other chronic pain: Secondary | ICD-10-CM | POA: Insufficient documentation

## 2016-03-15 DIAGNOSIS — M25552 Pain in left hip: Secondary | ICD-10-CM | POA: Diagnosis not present

## 2016-03-15 DIAGNOSIS — K219 Gastro-esophageal reflux disease without esophagitis: Secondary | ICD-10-CM | POA: Insufficient documentation

## 2016-03-15 HISTORY — DX: Other chronic pain: G89.29

## 2016-03-15 MED ORDER — LIDOCAINE HCL (PF) 1 % IJ SOLN
10.0000 mL | Freq: Once | INTRAMUSCULAR | Status: AC
  Administered 2016-03-15: 10 mL

## 2016-03-15 MED ORDER — ROPIVACAINE HCL 2 MG/ML IJ SOLN
4.0000 mL | Freq: Once | INTRAMUSCULAR | Status: AC
  Administered 2016-03-15: 4 mL
  Filled 2016-03-15: qty 10

## 2016-03-15 MED ORDER — METHYLPREDNISOLONE ACETATE 80 MG/ML IJ SUSP
80.0000 mg | Freq: Once | INTRAMUSCULAR | Status: AC
  Administered 2016-03-15: 80 mg
  Filled 2016-03-15: qty 1

## 2016-03-15 NOTE — Patient Instructions (Addendum)
Pain Management Discharge Instructions  General Discharge Instructions :  If you need to reach your doctor call: Monday-Friday 8:00 am - 4:00 pm at [phone] or toll free [phone].  After clinic hours [phone] to have operator reach doctor.  Bring all of your medication bottles to all your appointments in the pain clinic.  To cancel or reschedule your appointment with Pain Management please remember to call 24 hours in advance to avoid a fee.  Refer to the educational materials which you have been given on: General Risks, I had my Procedure. Discharge Instructions, Post Sedation.  Post Procedure Instructions:  The drugs you were given will stay in your system until tomorrow, so for the next 24 hours you should not drive, make any legal decisions or drink any alcoholic beverages.  You may eat anything you prefer, but it is better to start with liquids then soups and crackers, and gradually work up to solid foods.  Please notify your doctor immediately if you have any unusual bleeding, trouble breathing or pain that is not related to your normal pain.  Depending on the type of procedure that was done, some parts of your body may feel week and/or numb.  This usually clears up by tonight or the next day.  Walk with the use of an assistive device or accompanied by an adult for the 24 hours.  You may use ice on the affected area for the first 24 hours.  Put ice in a Ziploc bag and cover with a towel and place against area 15 minutes on 15 minutes off.  You may switch to heat after 24 hours.Celiac Plexus Block Patient Information  Description: The celiac plexus is a group of nerves which are part of the sympathetic nervous system.  These nerves supply organs in the abdomen and pelvis.  Specific organs supplied with sensation by the celiac plexus include the stomach, liver, gallbladder, pancreas, kidneys and part of the gut.   The celiac plexus is located on both sides of the aorta  at approximately the level of the first lumbar vertebral body.  The block will be performed with you lying on your abdomen with a pillow underneath.  Using direct x-ray guidance, the celiac plexus will be located on both sides of the spine.  Numbing medicine will be used to deaden the skin prior to needle insertion.  In most cases, a small amount of sedation can be given by IV prior to the numbing medicine.  Two small needles will be place near the celiac plexus and local anesthetic and steroid will be injected.  The entire block usually last about 15-25 minutes.  Conditions which may be treated by celiac plexus block:   Acute and chronic pancreatitis  Pain from liver or pancreatic cancer  Pain from Crohn's disease  Other types of abdominal or flank pain  Preparation for the injection:  1. Do not eat any solid food or dairy products within 8 hours of your appointment. 2. You may drink clear liquids up to 3 hours before appointment.  Clear liquids include water, black coffee, juice or soda.  No milk or cream please. 3. You may take your regular medication, including pain medications, with a sip of water before your appointment.  Diabetics should hold regular insulin (if taken separately) and take 1/2 normal NPH dose in the morning of the procedure.  Carry some sugar containing items with you to your appointment. 4. A driver must accompany you and be prepared to drive you home  after your procedure. 5. Bring all your current medications with you. 6. An IV may be inserted and sedation may be given at the discretion of the physician. 7. A blood pressure cuff, EKG, and other monitors will often be applied during the procedure.  Some patients may need to have extra oxygen administered for a short period. 8. You will be asked to provide medical information, including your allergies and medications, prior to the procedure.  We must know immediately if you are taking blood thinners (like  Coumadin/Warfarin) or if you are allergic to IV iodine contrast (dye).  We must know if you could possible be pregnant.  Possible side-effects:   Bleeding from needle site or deeper  Infection (rarre, can require surgery)  Nerve injury (rare)  Numbness & tingling (temporary)  Collapsed lung (rare)  Spinal headache ( a headache worse with upright posture)  Light-headedness (temporary)  Pain at injection site (several days)  Decreased blood pressure (temporary)  Weakness in legs (temporary)  Seizure or other drug reaction (rare)  Call if you experience:   Fever/chills associated with headache or increased back/neck pain  Headache worsened by an upright position  New onset weakness or numbness of an extremity below the injection site.  Hives or difficulty breathing (go to the emergency room)  Inflammation or drainage at the injection site.  New onset diarrhea lasting more than 2 weeks.  New symptoms which are concerning to you  Please note:  If effective, we will often do a series of 2-3 injections spaced 3-6 weeks apart to maximally decrease your pain.  If initial series is effective, you may be a candidate for a more permanent block of the celiac plexus. .  If you have questions, please call [phone] [facility]    Pain Management Discharge Instructions  General Discharge Instructions :  If you need to reach your doctor call: Monday-Friday 8:00 am - 4:00 pm at [phone] or toll free [phone].  After clinic hours [phone] to have operator reach doctor.  Bring all of your medication bottles to all your appointments in the pain clinic.  To cancel or reschedule your appointment with Pain Management please remember to call 24 hours in advance to avoid a fee.  Refer to the educational materials which you have been given on: General Risks, I had my Procedure. Discharge Instructions, Post Sedation.  Post  Procedure Instructions:  The drugs you were given will stay in your system until tomorrow, so for the next 24 hours you should not drive, make any legal decisions or drink any alcoholic beverages.  You may eat anything you prefer, but it is better to start with liquids then soups and crackers, and gradually work up to solid foods.  Please notify your doctor immediately if you have any unusual bleeding, trouble breathing or pain that is not related to your normal pain.  Depending on the type of procedure that was done, some parts of your body may feel week and/or numb.  This usually clears up by tonight or the next day.  Walk with the use of an assistive device or accompanied by an adult for the 24 hours.  You may use ice on the affected area for the first 24 hours.  Put ice in a Ziploc bag and cover with a towel and place against area 15 minutes on 15 minutes off.  You may switch to heat after 24 hours.

## 2016-03-15 NOTE — Progress Notes (Signed)
Patient's Name: Sarah [name]  MRN: [id number]  Referring Provider: Jackolyn Confer, MD  DOB: 31-Jul-1970  PCP: Rica Mast, MD  DOS: 03/15/2016  Note by: Kathlen Brunswick. Dossie Arbour, MD  Service setting: Ambulatory outpatient  Location: [facility] (AMB) Pain Management Facility  Visit type: Procedure  Specialty: Interventional Pain Management  Patient type: Established   Primary Reason for Visit: Interventional Pain Management Treatment. CC: Hip Pain (left ); Leg Pain (left); and Knee Pain (left)  Procedure:  Anesthesia, Analgesia, Anxiolysis:  Type: Therapeutic Gluteofemoral Bursa Injection Region: Greater Femoral Trochanter Region Level: Hip Joint Laterality: Left  Indications: 1. Trochanteric bursitis of hip (Left)   2. Chronic hip pain (Left)   3. Chronic knee pain (Left)     Pre-procedure Pain Score: 3 Reported level of pain is compatible with clinical observations Post-procedure Pain Score: 0-No pain  Type: Local Anesthesia Local Anesthetic: Lidocaine 1% Route: Infiltration ([place]/IM) IV Access: Declined Sedation: Declined  Indication(s): Analgesia     Pre-Procedure Assessment:  Sarah [name] is a 45 y.o. year old, female patient, seen today for interventional treatment. She has Calculus of kidney; High risk for colon cancer; Screening for breast cancer; Morbid obesity ([place]); GERD (gastroesophageal reflux disease); Trochanteric bursitis of hip (Left); Chronic knee pain (Left); Routine general medical examination at a health care facility; Acute left-sided low back pain; History of nephrolithiasis; and Chronic hip pain (Left) on her problem list.. Her primarily concern today is the Hip Pain (left ); Leg Pain (left); and Knee Pain (left)   Pain Type: Chronic pain Pain Location: Hip Pain Orientation: Left Pain Descriptors / Indicators: Aching, Throbbing, Nagging Pain Frequency: Constant  Date of Last Visit: 11/17/15 Service Provided on Last Visit: Procedure (left  GTBI)  Coagulation Parameters No results found for: INR, LABPROT, APTT, PLT  Verification of the correct person, correct site (including marking of site), and correct procedure were performed and confirmed by the patient.  Consent: Secured. Under the influence of no sedatives a written informed consent was obtained, after having provided information on the risks and possible complications. To fulfill our ethical and legal obligations, as recommended by the American Medical Association's Code of Ethics, we have provided information to the patient about our clinical impression; the nature and purpose of the treatment or procedure; the risks, benefits, and possible complications of the intervention; alternatives; the risk(s) and benefit(s) of the alternative treatment(s) or procedure(s); and the risk(s) and benefit(s) of doing nothing. The patient was provided information about the risks and possible complications associated with the procedure. These include, but are not limited to, failure to achieve desired goals, infection, bleeding, organ or nerve damage, allergic reactions, paralysis, and death. In the case of intra- or periarticular procedures these may include, but are not limited to, failure to achieve desired goals, infection, bleeding (hemarthrosis), organ or nerve damage, allergic reactions, and death. In addition, the patient was informed that Medicine is not an exact science; therefore, there is also the possibility of unforeseen risks and possible complications that may result in a catastrophic outcome. The patient indicated having understood very clearly. We have given the patient no guarantees and we have made no promises. Enough time was given to the patient to ask questions, all of which were answered to the patient's satisfaction.  Consent Attestation: I, the ordering provider, attest that I have discussed with the patient the benefits, risks, side-effects, alternatives, likelihood of  achieving goals, and potential problems during recovery for the procedure that I have provided informed consent.  Pre-Procedure Preparation: Safety Precautions: Allergies reviewed. Appropriate site, procedure, and patient were confirmed by following the Joint Commission's Universal Protocol (UP.01.01.01), in the form of a "Time Out". The patient was asked to confirm marked site and procedure, before commencing. The patient was asked about blood thinners, or active infections, both of which were denied. Patient was assessed for positional comfort and all pressure points were checked before starting procedure. Allergies: She is allergic to aspirin and sulfa antibiotics.. Infection Control Precautions: Sterile technique used. Standard Universal Precautions were taken as recommended by the Department of [facility] for Disease Control and Prevention (CDC). Standard pre-surgical skin prep was conducted. Respiratory hygiene and cough etiquette was practiced. Hand hygiene observed. Safe injection practices and needle disposal techniques followed. SDV (single dose vial) medications used. Medications properly checked for expiration dates and contaminants. Personal protective equipment (PPE) used: Sterile Radiation-resistant gloves. Monitoring:  As per clinic protocol. Vitals:   03/15/16 1300 03/15/16 1406  BP: 124/74 124/88  Pulse: (!) 106 99  Resp: 18 (!) 28  Temp: 98.2 F (36.8 C)   SpO2: 100% 100%  Weight: 200 lb (90.7 kg)   Height: _0  (1.575 m)   Calculated BMI: Body mass index is 36.58 kg/m.  Description of Procedure Process:  Time-out: "Time-out" completed before starting procedure, as per protocol. Position: Prone Target Area: Greater Trochanteric Bursa of . Approach: Posterolateral approach. Area Prepped: Entire Posterolateral Hip Area Prepping solution: ChloraPrep (2% chlorhexidine gluconate and 70% isopropyl alcohol) Safety Precautions: Aspiration looking for blood return was  conducted prior to all injections. At no point did we inject any substances, as a needle was being advanced. No attempts were made at seeking any paresthesias. Safe injection practices and needle disposal techniques used. Medications properly checked for expiration dates. SDV (single dose vial) medications used.   Description of the Procedure: Protocol guidelines were followed. The patient was placed in position over the procedure table. The target area was identified and the area prepped in the usual manner. Skin desensitized using vapocoolant spray. Skin & deeper tissues infiltrated with local anesthetic. Appropriate amount of time allowed to pass for local anesthetics to take effect. The procedure needles were then advanced to the target area. Proper needle placement secured. Negative aspiration confirmed. Solution injected in intermittent fashion, asking for systemic symptoms every 0.5cc of injectate. The needles were then removed and the area cleansed, making sure to leave some of the prepping solution back to take advantage of its long term bactericidal properties. EBL: None Materials & Medications Used:  Needle(s) Used: 22g - 3.5" Spinal Needle(s) Solution Injected: 0.2% PF-Ropivacaine (37m) + SDV-DepoMedrol 80 mg/ml (143m Medications Administered today: We administered methylPREDNISolone acetate, lidocaine (PF), and ropivacaine (PF) 2 mg/ml (0.2%).Please see chart orders for dosing details.  Imaging Guidance:  Type of Imaging Technique: Fluoroscopy Guidance (Non-spinal) Indication(s): Assistance in needle guidance and placement for procedures requiring needle placement in or near specific anatomical locations not easily accessible without such assistance. Exposure Time: Please see nurses notes. Contrast: None used. Fluoroscopic Guidance: I was personally present in the fluoroscopy suite, where the patient was placed in position for the procedure, over the fluoroscopy-compatible table.  Fluoroscopy was manipulated to obtain the best possible view of the target area, on the affected side. A "direction-depth-direction" technique was used to introduce the needle under continuous pulsed fluoroscopic guidance. Once the target was reached, antero-posterior, oblique, and lateral fluoroscopic projection views were taken to confirm needle placement in all planes. Permanently recorded images stored by scanning  into EMR. Interpretation: No contrast injected. Intraoperative imaging interpretation by performing Physician. Adequate needle placement confirmed. Needle placement confirmed in AP, lateral, & Oblique Views. Permanent hardcopy images in multiple planes scanned into the patient's record.  Antibiotic Prophylaxis:  Indication(s): No indications identified. Type:  Antibiotics Given (last 72 hours)    None        Post-operative Assessment:  Complications: No immediate post-treatment complications were observed. Disposition: The patient was discharged home, once institutional criteria were met. Return to clinic in 2 weeks for follow-up evaluation and interpretation of results. The patient tolerated the entire procedure well. A repeat set of vitals were taken after the procedure and the patient was kept under observation following institutional policy, for this type of procedure. Post-procedural neurological assessment was performed, showing return to baseline, prior to discharge. The patient was provided with post-procedure discharge instructions, including a section on how to identify potential problems. Should any problems arise concerning this procedure, the patient was given instructions to immediately contact us, at any time, without hesitation. In any case, we plan to contact the patient by telephone for a follow-up status report regarding this interventional procedure. Comments:  No additional relevant information.  Plan of Care   Problem List Items Addressed This Visit      High    Chronic hip pain (Left) (Chronic)   Relevant Medications   methylPREDNISolone acetate (DEPO-MEDROL) injection 80 mg (Completed)   lidocaine (PF) (XYLOCAINE) 1 % injection 10 mL (Completed)   ropivacaine (PF) 2 mg/ml (0.2%) (NAROPIN) epidural 4 mL (Completed)   Other Relevant Orders   DG C-Arm 1-60 Min-No Report   HIP INJECTION   Chronic knee pain (Left) (Chronic)   Relevant Medications   methylPREDNISolone acetate (DEPO-MEDROL) injection 80 mg (Completed)   lidocaine (PF) (XYLOCAINE) 1 % injection 10 mL (Completed)   ropivacaine (PF) 2 mg/ml (0.2%) (NAROPIN) epidural 4 mL (Completed)   Other Relevant Orders   DG Knee 1-2 Views Left   Trochanteric bursitis of hip (Left) - Primary   Relevant Medications   methylPREDNISolone acetate (DEPO-MEDROL) injection 80 mg (Completed)   lidocaine (PF) (XYLOCAINE) 1 % injection 10 mL (Completed)   ropivacaine (PF) 2 mg/ml (0.2%) (NAROPIN) epidural 4 mL (Completed)   Other Relevant Orders   DG C-Arm 1-60 Min-No Report   HIP INJECTION    Other Visit Diagnoses   None.     Requested PM Follow-up: Return in about 2 weeks (around 03/29/2016) for Post-Procedure evaluation.  No future appointments.  Primary Care Physician: Rica Mast, MD Location: [facility] Note by: Kathlen Brunswick. Dossie Arbour, M.D, DABA, DABAPM, DABPM, DABIPP, FIPP  Disclaimer:  Medicine is not an exact science. The only guarantee in medicine is that nothing is guaranteed. It is important to note that the decision to proceed with this intervention was based on the information collected from the patient. The Data and conclusions were drawn from the patient's questionnaire, the interview, and the physical examination. Because the information was provided in large part by the patient, it cannot be guaranteed that it has not been purposely or unconsciously manipulated. Every effort has been made to obtain as much relevant data as possible for this  evaluation. It is important to note that the conclusions that lead to this procedure are derived in large part from the available data. Always take into account that the treatment will also be dependent on availability of resources and existing treatment guidelines, considered by other Pain Management Practitioners as being common  knowledge and practice, at the time of the intervention. For Medico-Legal purposes, it is also important to point out that variation in procedural techniques and pharmacological choices are the acceptable norm. The indications, contraindications, technique, and results of the above procedure should only be interpreted and judged by a Board-Certified Interventional Pain Specialist with extensive familiarity and expertise in the same exact procedure and technique. Attempts at providing opinions without similar or greater experience and expertise than that of the treating physician will be considered as inappropriate and unethical, and shall result in a formal complaint to the state medical board and applicable specialty societies.

## 2016-03-15 NOTE — Progress Notes (Signed)
Safety precautions to be maintained throughout the outpatient stay will include: orient to surroundings, keep bed in low position, maintain call bell within reach at all times, provide assistance with transfer out of bed and ambulation.  

## 2016-03-16 ENCOUNTER — Telehealth: Payer: Self-pay

## 2016-03-16 NOTE — Telephone Encounter (Signed)
Post procedure phone call.  Patient states that she is having pain today.  Did get relief the first hour and 4-6 hours.  Encouraged to put heat on it today and give it a few days for the steroid to take effect.

## 2016-04-01 DIAGNOSIS — H524 Presbyopia: Secondary | ICD-10-CM | POA: Diagnosis not present

## 2016-05-24 ENCOUNTER — Ambulatory Visit
Admission: RE | Admit: 2016-05-24 | Discharge: 2016-05-24 | Disposition: A | Discharge status: Home / Self Care | Payer: 59 | Source: Ambulatory Visit | Attending: Pain Medicine | Admitting: Pain Medicine

## 2016-05-24 DIAGNOSIS — M179 Osteoarthritis of knee, unspecified: Secondary | ICD-10-CM | POA: Diagnosis not present

## 2016-05-24 DIAGNOSIS — G8929 Other chronic pain: Secondary | ICD-10-CM

## 2016-05-24 DIAGNOSIS — M25562 Pain in left knee: Principal | ICD-10-CM

## 2016-06-06 ENCOUNTER — Ambulatory Visit: Payer: Self-pay | Admitting: Physician Assistant

## 2016-06-06 ENCOUNTER — Encounter: Payer: Self-pay | Admitting: Physician Assistant

## 2016-06-06 VITALS — BP 118/84 | HR 76 | Temp 98.6°F

## 2016-06-06 DIAGNOSIS — R51 Headache: Principal | ICD-10-CM

## 2016-06-06 DIAGNOSIS — R519 Headache, unspecified: Secondary | ICD-10-CM

## 2016-06-06 MED ORDER — METHYLPREDNISOLONE 4 MG PO TBPK: ORAL_TABLET | ORAL | 0 refills | Status: DC

## 2016-06-06 MED ORDER — AZITHROMYCIN 250 MG PO TABS: ORAL_TABLET | ORAL | 0 refills | Status: DC

## 2016-06-06 NOTE — Progress Notes (Signed)
S: c/o headache on left side at temporal area that goes into left ear and side of face, no pain with jaw movement, no fever/chills, ?if its sinus as has a lot of sinus pressure, no change in vision, no v/, no numbness or tingling, no rash  O: vitals wnl, nad, perrl eomi, tms dull b/l, nasal mucosa pink swollen, tmj wnl, no tenderness at temporal artery, throat wnl, neck supple no lymph, lungs c t a, cv rrr, no rash noted  A: headache, sinusitis  P: zpack, medrol dose pack

## 2016-06-14 ENCOUNTER — Emergency Department
Admission: EM | Admit: 2016-06-14 | Discharge: 2016-06-14 | Disposition: A | Discharge status: Home / Self Care | Payer: 59 | Attending: Emergency Medicine | Admitting: Emergency Medicine

## 2016-06-14 ENCOUNTER — Emergency Department: Payer: 59

## 2016-06-14 ENCOUNTER — Encounter: Payer: Self-pay | Admitting: Emergency Medicine

## 2016-06-14 DIAGNOSIS — Z87891 Personal history of nicotine dependence: Secondary | ICD-10-CM | POA: Insufficient documentation

## 2016-06-14 DIAGNOSIS — R079 Chest pain, unspecified: Secondary | ICD-10-CM | POA: Insufficient documentation

## 2016-06-14 DIAGNOSIS — R51 Headache: Secondary | ICD-10-CM | POA: Diagnosis not present

## 2016-06-14 DIAGNOSIS — Z79899 Other long term (current) drug therapy: Secondary | ICD-10-CM | POA: Diagnosis not present

## 2016-06-14 DIAGNOSIS — R519 Headache, unspecified: Secondary | ICD-10-CM

## 2016-06-14 DIAGNOSIS — R0789 Other chest pain: Secondary | ICD-10-CM

## 2016-06-14 LAB — BASIC METABOLIC PANEL
ANION GAP: 5 (ref 5–15)
BUN: 14 mg/dL (ref 6–20)
CO2: 25 mmol/L (ref 22–32)
Calcium: 8.4 mg/dL — ABNORMAL LOW (ref 8.9–10.3)
Chloride: 107 mmol/L (ref 101–111)
Creatinine, Ser: 0.64 mg/dL (ref 0.44–1.00)
GFR calc Af Amer: >60 mL/min (ref >60)
GFR calc non Af Amer: >60 mL/min (ref >60)
GLUCOSE: 92 mg/dL (ref 65–99)
POTASSIUM: 4 mmol/L (ref 3.5–5.1)
Sodium: 137 mmol/L (ref 135–145)

## 2016-06-14 LAB — CBC
HEMATOCRIT: 39 % (ref 35.0–47.0)
HEMOGLOBIN: 13.6 g/dL (ref 12.0–16.0)
MCH: 30.7 pg (ref 26.0–34.0)
MCHC: 34.9 g/dL (ref 32.0–36.0)
MCV: 88 fL (ref 80.0–100.0)
Platelets: 244 10*3/uL (ref 150–440)
RBC: 4.44 MIL/uL (ref 3.80–5.20)
RDW: 14.6 % — ABNORMAL HIGH (ref 11.5–14.5)
WBC: 11.5 10*3/uL — ABNORMAL HIGH (ref 3.6–11.0)

## 2016-06-14 LAB — TROPONIN I: Troponin I: <0.03 ng/mL (ref <0.03)

## 2016-06-14 LAB — LIPASE, BLOOD: Lipase: 48 U/L (ref 11–51)

## 2016-06-14 LAB — HEPATIC FUNCTION PANEL
ALK PHOS: 54 U/L (ref 38–126)
ALT: 18 U/L (ref 14–54)
AST: 18 U/L (ref 15–41)
Albumin: 3.5 g/dL (ref 3.5–5.0)
BILIRUBIN DIRECT: 0.1 mg/dL (ref 0.1–0.5)
BILIRUBIN TOTAL: 0.8 mg/dL (ref 0.3–1.2)
Indirect Bilirubin: 0.7 mg/dL (ref 0.3–0.9)
Total Protein: 6.1 g/dL — ABNORMAL LOW (ref 6.5–8.1)

## 2016-06-14 LAB — SEDIMENTATION RATE: Sed Rate: 7 mm/hr (ref 0–20)

## 2016-06-14 LAB — FIBRIN DERIVATIVES D-DIMER (ARMC ONLY): Fibrin derivatives D-dimer (ARMC): 178 (ref 0–499)

## 2016-06-14 MED ORDER — PANTOPRAZOLE SODIUM 40 MG PO TBEC
40.0000 mg | DELAYED_RELEASE_TABLET | Freq: Once | ORAL | Status: AC
  Administered 2016-06-14: 40 mg via ORAL
  Filled 2016-06-14: qty 1

## 2016-06-14 MED ORDER — DIPHENHYDRAMINE HCL 50 MG/ML IJ SOLN
25.0000 mg | Freq: Once | INTRAMUSCULAR | Status: AC
  Administered 2016-06-14: 25 mg via INTRAVENOUS
  Filled 2016-06-14: qty 1

## 2016-06-14 MED ORDER — ESOMEPRAZOLE MAGNESIUM 40 MG PO CPDR: 40.0000 mg | DELAYED_RELEASE_CAPSULE | Freq: Every day | ORAL | 0 refills | Status: DC

## 2016-06-14 MED ORDER — METOCLOPRAMIDE HCL 5 MG/ML IJ SOLN
10.0000 mg | Freq: Once | INTRAMUSCULAR | Status: AC
  Administered 2016-06-14: 10 mg via INTRAVENOUS
  Filled 2016-06-14: qty 2

## 2016-06-14 MED ORDER — GI COCKTAIL ~~LOC~~
30.0000 mL | Freq: Once | ORAL | Status: AC
  Administered 2016-06-14: 30 mL via ORAL
  Filled 2016-06-14: qty 30

## 2016-06-14 MED ORDER — SODIUM CHLORIDE 0.9 % IV BOLUS (SEPSIS)
1000.0000 mL | Freq: Once | INTRAVENOUS | Status: AC
  Administered 2016-06-14: 1000 mL via INTRAVENOUS

## 2016-06-14 NOTE — ED Notes (Signed)
Lab to send tech to draw d-dimer and repeat troponin due to 2x d-dimer being hemolyzed.

## 2016-06-14 NOTE — ED Triage Notes (Addendum)
Pt ambulatory to triage with steady gait with /o mid chest pain since last night. Pt reports feeling slight nauseous. Pt states felt pain in epigastric region radiating to left back. Pt alert and oriented x 4,no increased work in breathing noted. Pt states was just placed on prednisone for headache and finished taking dosage.

## 2016-06-14 NOTE — Discharge Instructions (Signed)
Take nexium daily.   Continue your current meds.   Take tylenol for pain.   See your primary care doctor.   See eye doctor if you still have headaches and blurry vision   Return to ER if you have severe headaches, vomiting, chest pain, abdominal pain, fevers.

## 2016-06-14 NOTE — ED Provider Notes (Signed)
[facility] Provider Note   CSN: [id number] Arrival date & time: 06/14/16  3532     History   Chief Complaint Chief Complaint  Patient presents with  . Chest Pain    HPI Sarah [name] is a 45 y.o. female hx of GERD, Kidney stones here presenting with chest pain, headaches. Patient left temporal headaches for the last several days. Patient was seen by primary care doctor about a week ago and was prescribed Z-Pak, prednisone for presumed sinusitis. Patient just finished her medicines 2-3 days ago. Around 8 PM last night, she started having substernal chest pain. She states that it's in the epigastric area radiate to the lower chest. Feeling nauseated and since of dull ache. Has some subjective shortness of breath as well. She has hx of GERD and is currently on zantac but this is more severe. She has no known hx of CAD. No recent travel or hx of DVT/PE. She has persistent L sided headaches for the last few days that is not relieved with steroids but denies blurry vision or double vision or vomiting or weakness.   The history is provided by the patient.    Past Medical History:  Diagnosis Date  . Allergy   . Chicken pox   . GERD (gastroesophageal reflux disease)   . History of kidney stones   . Shingles     Patient Active Problem List   Diagnosis Date Noted  . Chronic hip pain (Left) 03/15/2016  . Acute left-sided low back pain 01/26/2016  . History of nephrolithiasis 01/26/2016  . Routine general medical examination at a health care facility 12/07/2015  . Trochanteric bursitis of hip (Left) 11/17/2015  . Chronic knee pain (Left) 11/17/2015  . Morbid obesity ([place]) 09/20/2015  . GERD (gastroesophageal reflux disease) 09/20/2015  . High risk for colon cancer 09/10/2015  . Screening for breast cancer 09/10/2015  . Calculus of kidney 05/17/2013    Past Surgical History:  Procedure Laterality Date  . ABDOMINAL HYSTERECTOMY  01/2001   partial  . CESAREAN  SECTION  12/1987  . CESAREAN SECTION  05/1993  . COLONOSCOPY WITH PROPOFOL N/A 01/29/2016   Procedure: COLONOSCOPY WITH PROPOFOL;  Surgeon: Manya Silvas, MD;  Location: [facility] ENDOSCOPY;  Service: Endoscopy;  Laterality: N/A;    OB History    No data available       Home Medications    Prior to Admission medications   Medication Sig Start Date End Date Taking? Authorizing Provider  b complex vitamins tablet Take 1 tablet by mouth daily.   Yes Historical Provider, MD  loratadine (CLARITIN) 10 MG tablet Take 10 mg by mouth daily.   Yes Historical Provider, MD  magnesium oxide (MAG-OX) 400 MG tablet Take 400 mg by mouth daily.   Yes Historical Provider, MD  Melatonin 3 MG TABS Take 1 tablet by mouth daily.   Yes Historical Provider, MD  ranitidine (ZANTAC) 150 MG capsule Take 150 mg by mouth 2 (two) times daily.   Yes Historical Provider, MD    Family History Family History  Problem Relation Age of Onset  . Breast cancer Mother   . Colon cancer Father   . Lung cancer Father   . Diabetes Father   . Hypertension Paternal Grandfather   . Breast cancer Maternal Grandmother     Social History Social History  Substance Use Topics  . Smoking status: Former Smoker    Quit date: 06/27/2010  . Smokeless tobacco: Never Used  Comment: quit 2015---currently using e cig  . Alcohol use 0.0 oz/week     Comment: occasional     Allergies   Aspirin and Sulfa antibiotics   Review of Systems Review of Systems  Cardiovascular: Positive for chest pain.  All other systems reviewed and are negative.    Physical Exam Updated Vital Signs BP 119/62   Pulse 75   Temp 98 F (36.7 C) (Oral)   Resp 17   Ht '5\' 2"'$  (1.575 m)   Wt 220 lb (99.8 kg)   SpO2 100%   BMI 40.24 kg/m   Physical Exam  Constitutional:  Anxious   HENT:  Head: Normocephalic.  Right Ear: External ear normal.  Left Ear: External ear normal.  Mouth/Throat: Oropharynx is clear and moist.  Mild L temporal  tenderness.   Eyes: EOM are normal. Pupils are equal, round, and reactive to light.  Neck: Normal range of motion. Neck supple.  Cardiovascular: Normal rate, regular rhythm and normal heart sounds.   Pulmonary/Chest: Effort normal and breath sounds normal. No respiratory distress. She has no wheezes. She has no rales.  Mild reproducible lower chest tenderness   Abdominal: Soft. Bowel sounds are normal. She exhibits no distension and no mass. There is no tenderness. There is no guarding.  Musculoskeletal: Normal range of motion.  Neurological: She is alert.  Skin: Skin is warm.  Psychiatric: She has a normal mood and affect.  Nursing note and vitals reviewed.    ED Treatments / Results  Labs (all labs ordered are listed, but only abnormal results are displayed) Labs Reviewed  BASIC METABOLIC PANEL - Abnormal; Notable for the following:       Result Value   Calcium 8.4 (*)    All other components within normal limits  CBC - Abnormal; Notable for the following:    WBC 11.5 (*)    RDW 14.6 (*)    All other components within normal limits  HEPATIC FUNCTION PANEL - Abnormal; Notable for the following:    Total Protein 6.1 (*)    All other components within normal limits  TROPONIN I  LIPASE, BLOOD  SEDIMENTATION RATE  FIBRIN DERIVATIVES D-DIMER ([facility] ONLY)  TROPONIN I    EKG  EKG Interpretation None       ED ECG REPORT I, Wandra Arthurs, the attending physician, personally viewed and interpreted this ECG.   Date: 06/14/2016  EKG Time: 6:47 am  Rate: 73  Rhythm: normal EKG, normal sinus rhythm  Axis: normal  Intervals:none  ST&T Change: nonspecific    Radiology Ct Head Wo Contrast  Result Date: 06/14/2016 CLINICAL DATA:  Frequent right-sided temporal headaches for the past week. EXAM: CT HEAD WITHOUT CONTRAST TECHNIQUE: Contiguous axial images were obtained from the base of the skull through the vertex without intravenous contrast. COMPARISON:  None. FINDINGS: Brain:  There is no evidence of acute cortical infarct, intracranial hemorrhage, mass, midline shift, or extra-axial fluid collection. The ventricles and sulci are normal. Vascular: No hyperdense vessel or unexpected calcification. Skull: No fracture or focal osseous lesion. Sinuses/Orbits: Visualized paranasal sinuses and mastoid air cells are clear. Visualized orbits are unremarkable. Other: None. IMPRESSION: Unremarkable head CT. Electronically Signed   By: Logan Bores M.D.   On: 06/14/2016 08:07   Dg Chest Port 1 View  Result Date: 06/14/2016 CLINICAL DATA:  Chest pain. EXAM: PORTABLE CHEST 1 VIEW COMPARISON:  Radiographs of April 13, 2007. FINDINGS: The heart size and mediastinal contours are within normal limits. Both lungs  are clear. No pneumothorax or pleural effusion is noted. The visualized skeletal structures are unremarkable. IMPRESSION: No acute cardiopulmonary abnormality seen. Electronically Signed   By: Marijo Conception, M.D.   On: 06/14/2016 07:34    Procedures Procedures (including critical care time)  Medications Ordered in ED Medications  gi cocktail (Maalox,Lidocaine,Donnatal) (30 mLs Oral Given 06/14/16 0718)  pantoprazole (PROTONIX) EC tablet 40 mg (40 mg Oral Given 06/14/16 0731)  metoCLOPramide (REGLAN) injection 10 mg (10 mg Intravenous Given 06/14/16 0728)  diphenhydrAMINE (BENADRYL) injection 25 mg (25 mg Intravenous Given 06/14/16 0729)  sodium chloride 0.9 % bolus 1,000 mL (0 mLs Intravenous Stopped 06/14/16 1130)     Initial Impression / Assessment and Plan / ED Course  I have reviewed the triage vital signs and the nursing notes.  Pertinent labs & imaging results that were available during my care of the patient were reviewed by me and considered in my medical decision making (see chart for details).  Clinical Course     Sarah [name] is a 45 y.o. female here with chest pain, headaches. Chest pain likely reflux, but consider PE vs ACS as well. Will get labs,  CXR, delta trop, d-dimer. Patient has L temporal headaches for the last week and finished steroids and zpack for presumed sinusitis. Has no blurry vision and has nl neuro exam. I considered temporal arteritis but its unusual since it didn't improve with steroids and I think likely patient has migraines. Will check ESR, CT head to r/o mass and will give migraine cocktail.   12:19 PM Pain improved. Delta trop neg, d-dimer neg. CXR unremarkable. ESR nl. I called Dr. Wallace Going from [facility], regarding possibility of temporal arteritis. Given patient's age and lack of risk factors and nl neuro exam, he doesn't think we need to try high dose steroids. He is glad to see patient outpatient for further evaluation and dilated exam. I think chest pain likely reflux. Will start nexium daily.    Final Clinical Impressions(s) / ED Diagnoses   Final diagnoses:  Frequent headaches    New Prescriptions New Prescriptions   No medications on file     Drenda Freeze, MD 06/14/16 1220

## 2016-09-14 ENCOUNTER — Other Ambulatory Visit: Payer: Self-pay | Admitting: Pain Medicine

## 2016-09-14 DIAGNOSIS — M545 Low back pain, unspecified: Secondary | ICD-10-CM

## 2016-09-14 MED ORDER — PREDNISONE 20 MG PO TABS: ORAL_TABLET | ORAL | 0 refills | Status: AC

## 2016-10-07 ENCOUNTER — Ambulatory Visit: Payer: Self-pay | Admitting: Registered Nurse

## 2016-10-07 VITALS — BP 110/80 | HR 97 | Temp 98.4°F

## 2016-10-07 DIAGNOSIS — H6592 Unspecified nonsuppurative otitis media, left ear: Secondary | ICD-10-CM

## 2016-10-07 DIAGNOSIS — Z87442 Personal history of urinary calculi: Secondary | ICD-10-CM

## 2016-10-07 DIAGNOSIS — R309 Painful micturition, unspecified: Secondary | ICD-10-CM

## 2016-10-07 LAB — POCT URINALYSIS DIPSTICK
Bilirubin, UA: NEGATIVE
GLUCOSE UA: NEGATIVE
Ketones, UA: NEGATIVE
Leukocytes, UA: NEGATIVE
NITRITE UA: NEGATIVE
PH UA: 6.5 (ref 5.0–8.0)
PROTEIN UA: NEGATIVE
RBC UA: NEGATIVE
SPEC GRAV UA: 1.01 (ref 1.010–1.025)
UROBILINOGEN UA: 0.2 U/dL

## 2016-10-07 MED ORDER — FLUCONAZOLE 150 MG PO TABS: ORAL_TABLET | ORAL | 0 refills | Status: DC

## 2016-10-07 MED ORDER — SALINE SPRAY 0.65 % NA SOLN: 2.0000 | NASAL | 0 refills | Status: DC

## 2016-10-07 MED ORDER — TAMSULOSIN HCL 0.4 MG PO CAPS
0.4000 mg | ORAL_CAPSULE | Freq: Every day | ORAL | 0 refills | Status: AC
Start: 2016-10-07 — End: 2016-10-21

## 2016-10-07 MED ORDER — PHENAZOPYRIDINE HCL 200 MG PO TABS: 200.0000 mg | ORAL_TABLET | Freq: Three times a day (TID) | ORAL | 0 refills | Status: DC

## 2016-10-07 MED ORDER — AMOXICILLIN-POT CLAVULANATE 500-125 MG PO TABS: 1.0000 | ORAL_TABLET | Freq: Two times a day (BID) | ORAL | 0 refills | Status: AC

## 2016-10-07 NOTE — Patient Instructions (Addendum)
Start augmentin 500mg  by mouth twice a day x 10 days for ear infection flomax 0.4mg  by mouth daily x 2 weeks Pyridium 200mg  by mouth three times a day as needed painful urination x 2 days Strain urine Hydrate Follow up for re-evaluation if unable to urinate every 8 hours, urine tea colored/cloudy, bloody urine, flank pain, fever greater than 100.21F, vomiting Otitis Media, Adult Otitis media occurs when there is inflammation and fluid in the middle ear. Your middle ear is a part of the ear that contains bones for hearing as well as air that helps send sounds to your brain. What are the causes? This condition is caused by a blockage in the eustachian tube. This tube drains fluid from the ear to the back of the nose (nasopharynx). A blockage in this tube can be caused by an object or by swelling (edema) in the tube. Problems that can cause a blockage include:  A cold or other upper respiratory infection.  Allergies.  An irritant, such as tobacco smoke.  Enlarged adenoids. The adenoids are areas of soft tissue located high in the back of the throat, behind the nose and the roof of the mouth.  A mass in the nasopharynx.  Damage to the ear caused by pressure changes (barotrauma). What are the signs or symptoms? Symptoms of this condition include:  Ear pain.  A fever.  Decreased hearing.  A headache.  Tiredness (lethargy).  Fluid leaking from the ear.  Ringing in the ear. How is this diagnosed? This condition is diagnosed with a physical exam. During the exam your health care provider will use an instrument called an otoscope to look into your ear and check for redness, swelling, and fluid. He or she will also ask about your symptoms. Your health care provider may also order tests, such as:  A test to check the movement of the eardrum (pneumatic otoscopy). This test is done by squeezing a small amount of air into the ear.  A test that changes air pressure in the middle ear to  check how well the eardrum moves and whether the eustachian tube is working (tympanogram). How is this treated? This condition usually goes away on its own within 3-5 days. But if the condition is caused by a bacteria infection and does not go away own its own, or keeps coming back, your health care provider may:  Prescribe antibiotic medicines to treat the infection.  Prescribe or recommend medicines to control pain. Follow these instructions at home:  Take over-the-counter and prescription medicines only as told by your health care provider.  If you were prescribed an antibiotic medicine, take it as told by your health care provider. Do not stop taking the antibiotic even if you start to feel better.  Keep all follow-up visits as told by your health care provider. This is important. Contact a health care provider if:  You have bleeding from your nose.  There is a lump on your neck.  You are not getting better in 5 days.  You feel worse instead of better. Get help right away if:  You have severe pain that is not controlled with medicine.  You have swelling, redness, or pain around your ear.  You have stiffness in your neck.  A part of your face is paralyzed.  The bone behind your ear (mastoid) is tender when you touch it.  You develop a severe headache. Summary  Otitis media is redness, soreness, and swelling of the middle ear.  This condition  usually goes away on its own within 3-5 days.  If the problem does not go away in 3-5 days, your health care provider may prescribe or recommend medicines to treat your symptoms.  If you were prescribed an antibiotic medicine, take it as told by your health care provider. This information is not intended to replace advice given to you by your health care provider. Make sure you discuss any questions you have with your health care provider. Document Released: 03/18/2004 Document Revised: 06/03/2016 Document Reviewed:  06/03/2016 Elsevier Interactive Patient Education  2017 [place].   Dietary Guidelines to Help Prevent Kidney Stones Kidney stones are deposits of minerals and salts that form inside your kidneys. Your risk of developing kidney stones may be greater depending on your diet, your lifestyle, the medicines you take, and whether you have certain medical conditions. Most people can reduce their chances of developing kidney stones by following the instructions below. Depending on your overall health and the type of kidney stones you tend to develop, your dietitian may give you more specific instructions. What are tips for following this plan? Reading food labels   Choose foods with "no salt added" or "low-salt" labels. Limit your sodium intake to less than 1500 mg per day.  Choose foods with calcium for each meal and snack. Try to eat about 300 mg of calcium at each meal. Foods that contain 200-500 mg of calcium per serving include:  8 oz (237 ml) of milk, fortified nondairy milk, and fortified fruit juice.  8 oz (237 ml) of kefir, yogurt, and soy yogurt.  4 oz (118 ml) of tofu.  1 oz of cheese.  1 cup (300 g) of dried figs.  1 cup (91 g) of cooked broccoli.  1-3 oz can of sardines or mackerel.  Most people need 1000 to 1500 mg of calcium each day. Talk to your dietitian about how much calcium is recommended for you. Shopping   Buy plenty of fresh fruits and vegetables. Most people do not need to avoid fruits and vegetables, even if they contain nutrients that may contribute to kidney stones.  When shopping for convenience foods, choose:  Whole pieces of fruit.  Premade salads with dressing on the side.  Low-fat fruit and yogurt smoothies.  Avoid buying frozen meals or prepared deli foods.  Look for foods with live cultures, such as yogurt and kefir. Cooking   Do not add salt to food when cooking. Place a salt shaker on the table and allow each person to add his or her own  salt to taste.  Use vegetable protein, such as beans, textured vegetable protein (TVP), or tofu instead of meat in pasta, casseroles, and soups. Meal planning   Eat less salt, if told by your dietitian. To do this:  Avoid eating processed or premade food.  Avoid eating fast food.  Eat less animal protein, including cheese, meat, poultry, or fish, if told by your dietitian. To do this:  Limit the number of times you have meat, poultry, fish, or cheese each week. Eat a diet free of meat at least 2 days a week.  Eat only one serving each day of meat, poultry, fish, or seafood.  When you prepare animal protein, cut pieces into small portion sizes. For most meat and fish, one serving is about the size of one deck of cards.  Eat at least 5 servings of fresh fruits and vegetables each day. To do this:  Keep fruits and vegetables on hand for  snacks.  Eat 1 piece of fruit or a handful of berries with breakfast.  Have a salad and fruit at lunch.  Have two kinds of vegetables at dinner.  Limit foods that are high in a substance called oxalate. These include:  Spinach.  Rhubarb.  Beets.  Potato chips and french fries.  Nuts.  If you regularly take a diuretic medicine, make sure to eat at least 1-2 fruits or vegetables high in potassium each day. These include:  Avocado.  Banana.  Orange, prune, carrot, or tomato juice.  Baked potato.  Cabbage.  Beans and split peas. General instructions   Drink enough fluid to keep your urine clear or pale yellow. This is the most important thing you can do.  Talk to your health care provider and dietitian about taking daily supplements. Depending on your health and the cause of your kidney stones, you may be advised:  Not to take supplements with vitamin C.  To take a calcium supplement.  To take a daily probiotic supplement.  To take other supplements such as magnesium, fish oil, or vitamin B6.  Take all medicines and  supplements as told by your health care provider.  Limit alcohol intake to no more than 1 drink a day for nonpregnant women and 2 drinks a day for men. One drink equals 12 oz of beer, 5 oz of wine, or 1 oz of hard liquor.  Lose weight if told by your health care provider. Work with your dietitian to find strategies and an eating plan that works best for you. What foods are not recommended? Limit your intake of the following foods, or as told by your dietitian. Talk to your dietitian about specific foods you should avoid based on the type of kidney stones and your overall health. Grains  Breads. Bagels. Rolls. Baked goods. Salted crackers. Cereal. Pasta. Vegetables  Spinach. Rhubarb. Beets. Canned vegetables. Angie Fava. Olives. Meats and other protein foods  Nuts. Nut butters. Large portions of meat, poultry, or fish. Salted or cured meats. Deli meats. Hot dogs. Sausages. Dairy  Cheese. Beverages  Regular soft drinks. Regular vegetable juice. Seasonings and other foods  Seasoning blends with salt. Salad dressings. Canned soups. Soy sauce. Ketchup. Barbecue sauce. Canned pasta sauce. Casseroles. Pizza. Lasagna. Frozen meals. Potato chips. [place] fries. Summary  You can reduce your risk of kidney stones by making changes to your diet.  The most important thing you can do is drink enough fluid. You should drink enough fluid to keep your urine clear or pale yellow.  Ask your health care provider or dietitian how much protein from animal sources you should eat each day, and also how much salt and calcium you should have each day. This information is not intended to replace advice given to you by your health care provider. Make sure you discuss any questions you have with your health care provider. Document Released: 10/08/2010 Document Revised: 05/24/2016 Document Reviewed: 05/24/2016 Elsevier Interactive Patient Education  2017 [employer].

## 2016-10-07 NOTE — Progress Notes (Signed)
Subjective:    Patient ID: Sarah [name], female    DOB: Dec 18, 1970, 46 y.o.   MRN: [id number]  45y/o caucasian female here for evaluation of dysuria/suprapubic pain x 1 week at end of urination.  Denied hematuria, foul odor, change in color, nausea, vomiting, diarrhea, flank/back pain, rash, swelling in hands/feet.  Has noticed intermittent headache.  Has tried pushing fluids.  History of nephrolithiasis/lithotripsy 2014.  Sees urologist annually.  Had xray a couple months ago and new kidney stone noted on left.  Doesn't strain urine/no strainer at home.  Patient doesn't follow any special diet and unsure what type of kidney stones she has.  PMHx seasonal allergic rhinitis, nephrolithiasis, left knee pain, left trochanteric bursitis hip, GERD, obesity  PSHx colonoscopy  FHx mother-breast cancer F-colon cancer      Review of Systems  Constitutional: Negative for activity change, appetite change, chills, diaphoresis, fatigue, fever and unexpected weight change.  HENT: Positive for ear pain, postnasal drip and rhinorrhea. Negative for congestion, dental problem, drooling, ear discharge, facial swelling, hearing loss, mouth sores, nosebleeds, sinus pain, sinus pressure, sneezing, sore throat, tinnitus, trouble swallowing and voice change.   Eyes: Negative for photophobia, pain, discharge, redness, itching and visual disturbance.  Respiratory: Negative for cough, choking, chest tightness, shortness of breath, wheezing and stridor.   Cardiovascular: Negative for chest pain, palpitations and leg swelling.  Gastrointestinal: Negative for abdominal distention, abdominal pain, blood in stool, constipation, diarrhea, nausea and vomiting.  Endocrine: Negative for cold intolerance and heat intolerance.  Genitourinary: Positive for dysuria. Negative for decreased urine volume, difficulty urinating, dyspareunia, enuresis, flank pain, frequency, genital sores, hematuria, menstrual problem, pelvic pain,  urgency, vaginal bleeding, vaginal discharge and vaginal pain.  Musculoskeletal: Negative for arthralgias, back pain, gait problem, joint swelling, myalgias, neck pain and neck stiffness.  Skin: Negative for color change, pallor, rash and wound.  Allergic/Immunologic: Positive for environmental allergies. Negative for food allergies.  Neurological: Positive for headaches. Negative for dizziness, tremors, seizures, syncope, facial asymmetry, speech difficulty, weakness, light-headedness and numbness.  Hematological: Negative for adenopathy. Does not bruise/bleed easily.  Psychiatric/Behavioral: Negative for agitation, behavioral problems, confusion and sleep disturbance.       Objective:   Physical Exam  Constitutional: She is oriented to person, place, and time. Vital signs are normal. She appears well-developed and well-nourished. She is active and cooperative.  Non-toxic appearance. She does not have a sickly appearance. She does not appear ill. No distress.  HENT:  Head: Normocephalic and atraumatic.  Right Ear: Hearing, external ear and ear canal normal. Tympanic membrane is not injected, not erythematous and not bulging. A middle ear effusion is present.  Left Ear: Hearing, external ear and ear canal normal. Tympanic membrane is injected, erythematous and bulging. A middle ear effusion is present.  Nose: Mucosal edema and rhinorrhea present. No nose lacerations, sinus tenderness, nasal deformity, septal deviation or nasal septal hematoma. No epistaxis.  No foreign bodies. Right sinus exhibits no maxillary sinus tenderness and no frontal sinus tenderness. Left sinus exhibits no maxillary sinus tenderness and no frontal sinus tenderness.  Mouth/Throat: Uvula is midline and mucous membranes are normal. Mucous membranes are not pale, not dry and not cyanotic. She does not have dentures. No oral lesions. No trismus in the jaw. Normal dentition. No dental abscesses, uvula swelling, lacerations or  dental caries. Posterior oropharyngeal edema and posterior oropharyngeal erythema present. No oropharyngeal exudate or tonsillar abscesses.  Bilateral TMs air fluid level clear; left TM injection, erythema, bulging centrally;  cobblestoning posterior pharynx; bilateral allergic shiners; bilateral nasal turbinates edema/erythema clear discharge  Eyes: Conjunctivae, EOM and lids are normal. Pupils are equal, round, and reactive to light. Right eye exhibits no chemosis, no discharge, no exudate and no hordeolum. No foreign body present in the right eye. Left eye exhibits no chemosis, no discharge, no exudate and no hordeolum. No foreign body present in the left eye. Right conjunctiva is not injected. Right conjunctiva has no hemorrhage. Left conjunctiva is not injected. Left conjunctiva has no hemorrhage. No scleral icterus. Right eye exhibits normal extraocular motion and no nystagmus. Left eye exhibits normal extraocular motion and no nystagmus. Right pupil is round and reactive. Left pupil is round and reactive. Pupils are equal.  Neck: Trachea normal and normal range of motion. Neck supple. No tracheal tenderness, no spinous process tenderness and no muscular tenderness present. No neck rigidity. No tracheal deviation, no edema, no erythema and normal range of motion present. No thyroid mass and no thyromegaly present.  Cardiovascular: Normal rate, regular rhythm, S1 normal, S2 normal, normal heart sounds and intact distal pulses.  PMI is not displaced.  Exam reveals no gallop and no friction rub.   No murmur heard. Pulmonary/Chest: Effort normal and breath sounds normal. No accessory muscle usage or stridor. No respiratory distress. She has no decreased breath sounds. She has no wheezes. She has no rhonchi. She has no rales. She exhibits no tenderness.  Speaks full sentences without difficulty; no cough observed in exam room  Abdominal: Soft. Normal appearance and bowel sounds are normal. She exhibits no  shifting dullness, no distension, no pulsatile liver, no fluid wave, no abdominal bruit, no ascites, no pulsatile midline mass and no mass. There is no hepatosplenomegaly. There is tenderness in the suprapubic area. There is no rigidity, no rebound, no guarding, no CVA tenderness, no tenderness at McBurney's point and negative Murphy's sign. No hernia. Hernia confirmed negative in the ventral area.    Dull to percussion x 4 quads; normoactive bowel sounds; slightly TTP suprapubic low  Musculoskeletal: Normal range of motion. She exhibits no edema, tenderness or deformity.       Right shoulder: Normal.       Left shoulder: Normal.       Right hip: Normal.       Left hip: Normal.       Right knee: Normal.       Left knee: Normal.       Right ankle: Normal.       Left ankle: Normal.       Cervical back: Normal.       Thoracic back: Normal.       Lumbar back: Normal.       Right hand: Normal.       Left hand: Normal.  Lymphadenopathy:       Head (right side): No submental, no submandibular, no tonsillar, no preauricular, no posterior auricular and no occipital adenopathy present.       Head (left side): No submental, no submandibular, no tonsillar, no preauricular, no posterior auricular and no occipital adenopathy present.    She has no cervical adenopathy.       Right cervical: No superficial cervical, no deep cervical and no posterior cervical adenopathy present.      Left cervical: No superficial cervical, no deep cervical and no posterior cervical adenopathy present.  Neurological: She is alert and oriented to person, place, and time. She has normal strength. She is not disoriented. She displays no  atrophy and no tremor. No cranial nerve deficit or sensory deficit. She exhibits normal muscle tone. She displays no seizure activity. Coordination and gait normal. GCS eye subscore is 4. GCS verbal subscore is 5. GCS motor subscore is 6.  Skin: Skin is warm, dry and intact. No abrasion, no  bruising, no burn, no ecchymosis, no laceration, no lesion, no petechiae and no rash noted. She is not diaphoretic. No cyanosis or erythema. No pallor. Nails show no clubbing.  Psychiatric: She has a normal mood and affect. Her speech is normal and behavior is normal. Judgment and thought content normal. Cognition and memory are normal.  Nursing note and vitals reviewed.   Reviewed urinalysis dipstick results with patient in exam room glu negative bil negative ket negative SG 1.010 Blood negative ph 6.5 pro negative uro 0.2 e.u/dl Nit negative and LEU negative.  Patient verbalized understanding information and had no further questions at this time.      Assessment & Plan:  A-dysuria, history of kidney stones, left otitis media acute nonsupportive, seasonal allergic rhinitis  P-urine dipstick completed in clinic.  Normal/negative results.  Discussed with patient if dilute sample it could skew results.  Discussed augmentin for ear infection also can cover for UTI.  Patient with history of vaginal yeast infections when she takes antibiotics has used diflucan 150mg  x1 in the past with good relief.  Rx diflucan 150mg  po x 1 may repeat in 72 hours #2 RF0  Discussed with patient that diflucan can interact with her nexium and increase nexium blood levels. With short therapy duration 1 day do not expect any sequela.  Urology or PCM appt to be scheduled if no stone passed in next 14 days.  Strain all urine.  Hydrate, hydrate.   Flomax 0.4mg  daily as directed.  Patient refused KUB today.  Pyridium 200mg  po TID prn dysuria. Patient is also to push fluids and strain urine. Call or return to clinic as needed if these symptoms worsen or fail to improve as anticipated e.g. gross hematuria, fever, worsening pain, unable to void every 8 hours, flank pain, vomiting.  Exitcare handout on nephrolithiasis and dietary choices to prevent kidney stones given to patient.  Patient verbalized agreement and understanding of  treatment plan and had no further questions at this time. P2:  Hydrate, post coital urination, and cranberry juice  -Supportive treatment.   No evidence of invasive bacterial infection, non toxic and well hydrated.   I do not see where any further testing or imaging is necessary at this time.   I will suggest supportive care, rest, good hygiene and encourage the patient to take adequate fluids.  The patient is to return to clinic or EMERGENCY ROOM if symptoms worsen or change significantly e.g. ear pain, fever, purulent discharge from ears or bleeding.  Exitcare handout on otitis media with effusion given to patient.  Patient verbalized agreement and understanding of treatment plan.    start augmentin 500mg  po BID x 10 days.  Rx given.  No evidence of systemic bacterial infection, non toxic and well hydrated.  I do not see where any further testing or imaging is necessary at this time.   I will suggest supportive care, rest, good hygiene and encourage the patient to take adequate fluids.  The patient is to return to clinic or EMERGENCY ROOM if symptoms worsen or change significantly.  Exitcare handout on otitis media given to patient.  Patient verbalized agreement and understanding of treatment plan and had no further questions  at this time.   P2:  Hand washing and cover cough  Restart flonase 1 spray each nostril BID, saline 2 sprays each nostril q2h prn congestion. Continue claritin 10mg  po daily.  Patient may use normal saline nasal spray as needed.  Consider nasal steroid use.  Avoid triggers if possible.  Shower prior to bedtime if exposed to triggers.  If allergic dust/dust mites recommend mattress/pillow covers/encasements; washing linens, vacuuming, sweeping, dusting weekly.  Call or return to clinic as needed if these symptoms worsen or fail to improve as anticipated.   Exitcare handout on allergic rhinitis given to patient.  Patient verbalized understanding of information/instructions, agreed with  plan of care and had no further questions at this time.

## 2016-12-07 ENCOUNTER — Other Ambulatory Visit: Payer: Self-pay

## 2016-12-07 ENCOUNTER — Ambulatory Visit
Admission: RE | Admit: 2016-12-07 | Discharge: 2016-12-07 | Disposition: A | Discharge status: Home / Self Care | Payer: 59 | Source: Ambulatory Visit | Attending: Pain Medicine | Admitting: Pain Medicine

## 2016-12-07 ENCOUNTER — Ambulatory Visit: Payer: 59 | Attending: Pain Medicine | Admitting: Pain Medicine

## 2016-12-07 ENCOUNTER — Encounter: Payer: Self-pay | Admitting: Pain Medicine

## 2016-12-07 VITALS — BP 127/98 | HR 85 | Temp 97.5°F | Resp 13 | Ht 62.0 in | Wt 220.0 lb

## 2016-12-07 DIAGNOSIS — M5412 Radiculopathy, cervical region: Secondary | ICD-10-CM | POA: Insufficient documentation

## 2016-12-07 DIAGNOSIS — M542 Cervicalgia: Secondary | ICD-10-CM

## 2016-12-07 DIAGNOSIS — M8938 Hypertrophy of bone, other site: Secondary | ICD-10-CM | POA: Insufficient documentation

## 2016-12-07 DIAGNOSIS — M545 Low back pain: Secondary | ICD-10-CM

## 2016-12-07 DIAGNOSIS — G8929 Other chronic pain: Secondary | ICD-10-CM | POA: Insufficient documentation

## 2016-12-07 DIAGNOSIS — M47812 Spondylosis without myelopathy or radiculopathy, cervical region: Secondary | ICD-10-CM | POA: Insufficient documentation

## 2016-12-07 DIAGNOSIS — M791 Myalgia: Secondary | ICD-10-CM

## 2016-12-07 DIAGNOSIS — B373 Candidiasis of vulva and vagina: Secondary | ICD-10-CM

## 2016-12-07 DIAGNOSIS — M792 Neuralgia and neuritis, unspecified: Secondary | ICD-10-CM | POA: Insufficient documentation

## 2016-12-07 DIAGNOSIS — B3731 Acute candidiasis of vulva and vagina: Secondary | ICD-10-CM

## 2016-12-07 DIAGNOSIS — M7918 Myalgia, other site: Secondary | ICD-10-CM | POA: Insufficient documentation

## 2016-12-07 HISTORY — DX: Radiculopathy, cervical region: M54.12

## 2016-12-07 HISTORY — DX: Spondylosis without myelopathy or radiculopathy, cervical region: M47.812

## 2016-12-07 HISTORY — DX: Neuralgia and neuritis, unspecified: M79.2

## 2016-12-07 HISTORY — DX: Myalgia, other site: M79.18

## 2016-12-07 MED ORDER — TRIAMCINOLONE ACETONIDE 40 MG/ML IJ SUSP
INTRAMUSCULAR | Status: AC
  Filled 2016-12-07: qty 1

## 2016-12-07 MED ORDER — FLUCONAZOLE 150 MG PO TABS: ORAL_TABLET | ORAL | 0 refills | Status: DC

## 2016-12-07 MED ORDER — CYCLOBENZAPRINE HCL 10 MG PO TABS: 10.0000 mg | ORAL_TABLET | Freq: Every day | ORAL | 0 refills | Status: DC

## 2016-12-07 MED ORDER — ROPIVACAINE HCL 2 MG/ML IJ SOLN
1.0000 mL | Freq: Once | INTRAMUSCULAR | Status: AC
  Administered 2016-12-07: 10 mL via EPIDURAL

## 2016-12-07 MED ORDER — DEXAMETHASONE SODIUM PHOSPHATE 10 MG/ML IJ SOLN
INTRAMUSCULAR | Status: AC
  Filled 2016-12-07: qty 1

## 2016-12-07 MED ORDER — IOPAMIDOL (ISOVUE-M 200) INJECTION 41%
10.0000 mL | Freq: Once | INTRAMUSCULAR | Status: AC
  Administered 2016-12-07: 10 mL via EPIDURAL

## 2016-12-07 MED ORDER — LIDOCAINE HCL (PF) 1.5 % IJ SOLN: 20.0000 mL | Freq: Once | INTRAMUSCULAR | Status: DC

## 2016-12-07 MED ORDER — DEXAMETHASONE SODIUM PHOSPHATE 10 MG/ML IJ SOLN
10.0000 mg | Freq: Once | INTRAMUSCULAR | Status: AC
  Administered 2016-12-07: 10 mg

## 2016-12-07 MED ORDER — IOPAMIDOL (ISOVUE-M 200) INJECTION 41%
INTRAMUSCULAR | Status: AC
  Filled 2016-12-07: qty 10

## 2016-12-07 MED ORDER — GABAPENTIN 100 MG PO CAPS: 100.0000 mg | ORAL_CAPSULE | Freq: Every day | ORAL | 0 refills | Status: DC

## 2016-12-07 MED ORDER — SODIUM CHLORIDE 0.9% FLUSH
1.0000 mL | Freq: Once | INTRAVENOUS | Status: AC
  Administered 2016-12-07: 10 mL

## 2016-12-07 MED ORDER — TRAMADOL HCL 50 MG PO TABS: 50.0000 mg | ORAL_TABLET | Freq: Four times a day (QID) | ORAL | 0 refills | Status: DC | PRN

## 2016-12-07 MED ORDER — TRIAMCINOLONE ACETONIDE 40 MG/ML IJ SUSP
40.0000 mg | Freq: Once | INTRAMUSCULAR | Status: AC
  Administered 2016-12-07: 40 mg

## 2016-12-07 NOTE — Progress Notes (Signed)
Patient's Name: Sarah [name]  MRN: [id number]  Referring Provider: Coral Spikes, DO  DOB: 1970-07-26  PCP: Coral Spikes, DO  DOS: 12/07/2016  Note by: Kathlen Brunswick. Dossie Arbour, MD  Service setting: Ambulatory outpatient  Location: [facility] (AMB) Pain Management Facility  Visit type: Procedure  Specialty: Interventional Pain Management  Patient type: Established   Primary Reason for Visit: Interventional Pain Management Treatment. CC: Neck Pain (left)  Procedure:  Anesthesia, Analgesia, Anxiolysis:  Type: Diagnostic, Inter-Laminar, Epidural Steroid Injection #1 Region: Posterior Cervico-thoracic Region Level: C7-T1 Laterality: Left-Sided Paramedial  Type: Local Anesthesia Local Anesthetic: Lidocaine 1% Route: Infiltration ([place]/IM) IV Access: Declined Sedation: Declined  Indication(s): Analgesia          Indications: 1. Cervical radicular pain (upper cervical) (Left)   2. Cervical radiculitis (Left)   3. Acute neck pain (Left)    Pain Score: Pre-procedure: 4 /10 Post-procedure: 2  (scapula tingling is gone, still pain when turning head.)/10  Pre-op Assessment:  Previous date of service: 03/15/16 Service provided: Procedure (left hip) Sarah [name] is a 46 y.o. (year old), female patient, seen today for interventional treatment. She  has a past surgical history that includes Cesarean section (12/1987); Cesarean section (05/1993); Abdominal hysterectomy (01/2001); and Colonoscopy with propofol (N/A, 01/29/2016). Her primarily concern today is the Neck Pain (left)  The patient presents with acute left-sided neck pain with significant decreased range of motion and a left sided cervical radiculitis apparently affecting the C2, C3, C4, and C5 nerve roots on the left. X-rays done today revealed reversal of the normal cervical lordosis, no pathologic motion, and mild multilevel uncovertebral hypertrophy.  Initial Vital Signs: There were no vitals taken for this visit. BMI: 40.24 kg/m  Risk  Assessment: Allergies: Reviewed. She is allergic to aspirin and sulfa antibiotics.  Allergy Precautions: None required Coagulopathies: Reviewed. None identified.  Blood-thinner therapy: None at this time Active Infection(s): Reviewed. None identified. Sarah [name] is afebrile  Site Confirmation: Sarah [name] was asked to confirm the procedure and laterality before marking the site Procedure checklist: Completed Consent: Before the procedure and under the influence of no sedative(s), amnesic(s), or anxiolytics, the patient was informed of the treatment options, risks and possible complications. To fulfill our ethical and legal obligations, as recommended by the American Medical Association's Code of Ethics, I have informed the patient of my clinical impression; the nature and purpose of the treatment or procedure; the risks, benefits, and possible complications of the intervention; the alternatives, including doing nothing; the risk(s) and benefit(s) of the alternative treatment(s) or procedure(s); and the risk(s) and benefit(s) of doing nothing. The patient was provided information about the general risks and possible complications associated with the procedure. These may include, but are not limited to: failure to achieve desired goals, infection, bleeding, organ or nerve damage, allergic reactions, paralysis, and death. In addition, the patient was informed of those risks and complications associated to Spine-related procedures, such as failure to decrease pain; infection (i.e.: Meningitis, epidural or intraspinal abscess); bleeding (i.e.: epidural hematoma, subarachnoid hemorrhage, or any other type of intraspinal or peri-dural bleeding); organ or nerve damage (i.e.: Any type of peripheral nerve, nerve root, or spinal cord injury) with subsequent damage to sensory, motor, and/or autonomic systems, resulting in permanent pain, numbness, and/or weakness of one or several areas of the body; allergic  reactions; (i.e.: anaphylactic reaction); and/or death. Furthermore, the patient was informed of those risks and complications associated with the medications. These include, but are not limited to: allergic reactions (  i.e.: anaphylactic or anaphylactoid reaction(s)); adrenal axis suppression; blood sugar elevation that in diabetics may result in ketoacidosis or comma; water retention that in patients with history of congestive heart failure may result in shortness of breath, pulmonary edema, and decompensation with resultant heart failure; weight gain; swelling or edema; medication-induced neural toxicity; particulate matter embolism and blood vessel occlusion with resultant organ, and/or nervous system infarction; and/or aseptic necrosis of one or more joints. Finally, the patient was informed that Medicine is not an exact science; therefore, there is also the possibility of unforeseen or unpredictable risks and/or possible complications that may result in a catastrophic outcome. The patient indicated having understood very clearly. We have given the patient no guarantees and we have made no promises. Enough time was given to the patient to ask questions, all of which were answered to the patient's satisfaction. Sarah [name] has indicated that she wanted to continue with the procedure. Attestation: I, the ordering provider, attest that I have discussed with the patient the benefits, risks, side-effects, alternatives, likelihood of achieving goals, and potential problems during recovery for the procedure that I have provided informed consent. Date: 12/07/2016; Time: 12:28 PM  Pre-Procedure Preparation:  Monitoring: As per clinic protocol. Respiration, ETCO2, SpO2, BP, heart rate and rhythm monitor placed and checked for adequate function Safety Precautions: Patient was assessed for positional comfort and pressure points before starting the procedure. Time-out: I initiated and conducted the "Time-out" before  starting the procedure, as per protocol. The patient was asked to participate by confirming the accuracy of the "Time Out" information. Verification of the correct person, site, and procedure were performed and confirmed by me, the nursing staff, and the patient. "Time-out" conducted as per Joint Commission's Universal Protocol (UP.01.01.01). "Time-out" Date & Time: 12/07/2016; 1323 hrs.  Description of Procedure Process:   Position: Prone with head of the table was raised to facilitate breathing. Target Area: For Epidural Steroid injections the target is the interlaminar space, initially targeting the lower border of the superior vertebral body lamina. Approach: Paramedial approach. Area Prepped: Entire PosteriorCervical Region Prepping solution: ChloraPrep (2% chlorhexidine gluconate and 70% isopropyl alcohol) Safety Precautions: Aspiration looking for blood return was conducted prior to all injections. At no point did we inject any substances, as a needle was being advanced. No attempts were made at seeking any paresthesias. Safe injection practices and needle disposal techniques used. Medications properly checked for expiration dates. SDV (single dose vial) medications used. Description of the Procedure: Protocol guidelines were followed. The procedure needle was introduced through the skin, ipsilateral to the reported pain, and advanced to the target area. Bone was contacted and the needle walked caudad, until the lamina was cleared. The epidural space was identified using "loss-of-resistance technique" with 2-3 ml of PF-NaCl (0.9% NSS), in a 5cc LOR glass syringe. Vitals:   12/07/16 1321 12/07/16 1324 12/07/16 1328 12/07/16 1332  BP: 127/87 (!) 139/95 (!) 140/103 (!) 127/98  Pulse:      Resp: 18 16 13 13   Temp:      SpO2: 95% 97% 97% 97%  Weight:      Height:        Start Time: 1324 hrs. End Time: 1334 hrs. Materials:  Needle(s) Type: Epidural needle Gauge: 22G Length:  3.5-in Medication(s): We administered iopamidol, dexamethasone, ropivacaine (PF) 2 mg/mL (0.2%), sodium chloride flush, and triamcinolone acetonide. Please see chart orders for dosing details.  Imaging Guidance (Spinal):  Type of Imaging Technique: Fluoroscopy Guidance (Spinal) Indication(s): Assistance in needle guidance and  placement for procedures requiring needle placement in or near specific anatomical locations not easily accessible without such assistance. Exposure Time: Please see nurses notes. Contrast: Before injecting any contrast, we confirmed that the patient did not have an allergy to iodine, shellfish, or radiological contrast. Once satisfactory needle placement was completed at the desired level, radiological contrast was injected. Contrast injected under live fluoroscopy. No contrast complications. See chart for type and volume of contrast used. Fluoroscopic Guidance: I was personally present during the use of fluoroscopy. "Tunnel Vision Technique" used to obtain the best possible view of the target area. Parallax error corrected before commencing the procedure. "Direction-depth-direction" technique used to introduce the needle under continuous pulsed fluoroscopy. Once target was reached, antero-posterior, oblique, and lateral fluoroscopic projection used confirm needle placement in all planes. Images permanently stored in EMR. Interpretation: I personally interpreted the imaging intraoperatively. Adequate needle placement confirmed in multiple planes. Appropriate spread of contrast into desired area was observed. No evidence of afferent or efferent intravascular uptake. No intrathecal or subarachnoid spread observed. Permanent images saved into the patient's record.  Antibiotic Prophylaxis:  Indication(s): None identified Antibiotic given: None  Post-operative Assessment:  EBL: None Complications: No immediate post-treatment complications observed by team, or reported by  patient. Note: The patient tolerated the entire procedure well. A repeat set of vitals were taken after the procedure and the patient was kept under observation following institutional policy, for this type of procedure. Post-procedural neurological assessment was performed, showing return to baseline, prior to discharge. The patient was provided with post-procedure discharge instructions, including a section on how to identify potential problems. Should any problems arise concerning this procedure, the patient was given instructions to immediately contact us, at any time, without hesitation. In any case, we plan to contact the patient by telephone for a follow-up status report regarding this interventional procedure. Comments:  No additional relevant information.  Plan of Care  Disposition: Discharge home  Discharge Date & Time: 12/07/2016; 1337 hrs.  Physician-requested Follow-up:  Return for post-procedure eval (in 2 wks), by MD.  Future Appointments Date Time Provider [employer]  12/09/2016 9:15 AM Coral Spikes, DO [id number] None   Medications ordered for procedure: Meds ordered this encounter  Medications  . lidocaine 1.5 % injection 20 mL    This order is for documentation purposes only. Lidocaine w/ preservative included in block trays.  . iopamidol (ISOVUE-M) 41 % intrathecal injection 10 mL  . dexamethasone (DECADRON) injection 10 mg  . ropivacaine (PF) 2 mg/mL (0.2%) (NAROPIN) injection 1 mL  . sodium chloride flush (NS) 0.9 % injection 1 mL  . triamcinolone acetonide (KENALOG-40) injection 40 mg  . cyclobenzaprine (FLEXERIL) 10 MG tablet    Sig: Take 1 tablet (10 mg total) by mouth at bedtime.    Dispense:  90 tablet    Refill:  0    Do not place this medication, or any other prescription from our practice, on "Automatic Refill". Patient may have prescription filled one day early if pharmacy is closed on scheduled refill date.  . gabapentin (NEURONTIN) 100 MG capsule     Sig: Take 1-3 capsules (100-300 mg total) by mouth at bedtime.    Dispense:  90 capsule    Refill:  0    Do not place this medication, or any other prescription from our practice, on "Automatic Refill". Patient may have prescription filled one day early if pharmacy is closed on scheduled refill date.  . traMADol (ULTRAM) 50 MG tablet  Sig: Take 1 tablet (50 mg total) by mouth every 6 (six) hours as needed for severe pain.    Dispense:  40 tablet    Refill:  0    Do not place this medication, or any other prescription from our practice, on "Automatic Refill". Patient may have prescription filled one day early if pharmacy is closed on scheduled refill date. Do not fill until: 12/07/16 To last until: 12/17/16  . fluconazole (DIFLUCAN) 150 MG tablet    Sig: Take 1 tablet (150 mg) by mouth. May repeat in 72 hours x 1.    Dispense:  2 tablet    Refill:  0    Do not place medication on "Automatic Refill". Fill one day early if pharmacy is closed on scheduled refill date.   Medications administered: We administered iopamidol, dexamethasone, ropivacaine (PF) 2 mg/mL (0.2%), sodium chloride flush, and triamcinolone acetonide.  See the medical record for exact dosing, route, and time of administration.  Lab-work, Procedure(s), & Referral(s) Ordered: Orders Placed This Encounter  Procedures  . Cervical Epidural Injection  . DG C-Arm 1-60 Min-No Report  . Informed Consent Details: Transcribe to consent form and obtain patient signature  . Provider attestation of informed consent for procedure/surgical case  . Verify informed consent  . Discharge instructions  . Follow-up   Imaging Ordered: New Prescriptions   CYCLOBENZAPRINE (FLEXERIL) 10 MG TABLET    Take 1 tablet (10 mg total) by mouth at bedtime.   GABAPENTIN (NEURONTIN) 100 MG CAPSULE    Take 1-3 capsules (100-300 mg total) by mouth at bedtime.   TRAMADOL (ULTRAM) 50 MG TABLET    Take 1 tablet (50 mg total) by mouth every 6 (six) hours  as needed for severe pain.   Primary Care Physician: Coral Spikes, DO Location: [facility] Note by: Kathlen Brunswick. Dossie Arbour, M.D, DABA, DABAPM, DABPM, DABIPP, FIPP Date: 12/07/2016; Time: 3:14 PM  Disclaimer:  Medicine is not an Chief Strategy Officer. The only guarantee in medicine is that nothing is guaranteed. It is important to note that the decision to proceed with this intervention was based on the information collected from the patient. The Data and conclusions were drawn from the patient's questionnaire, the interview, and the physical examination. Because the information was provided in large part by the patient, it cannot be guaranteed that it has not been purposely or unconsciously manipulated. Every effort has been made to obtain as much relevant data as possible for this evaluation. It is important to note that the conclusions that lead to this procedure are derived in large part from the available data. Always take into account that the treatment will also be dependent on availability of resources and existing treatment guidelines, considered by other Pain Management Practitioners as being common knowledge and practice, at the time of the intervention. For Medico-Legal purposes, it is also important to point out that variation in procedural techniques and pharmacological choices are the acceptable norm. The indications, contraindications, technique, and results of the above procedure should only be interpreted and judged by a Board-Certified Interventional Pain Specialist with extensive familiarity and expertise in the same exact procedure and technique.  Instructions provided at this appointment: Patient Instructions   ____________________________________________________________________________________________     You have Diflucan and flexeril that were e scribed to your pharmacy today.  Take as prescribed.      Post-Procedure  instructions Instructions:  Apply ice: Fill a plastic sandwich bag with crushed ice. Cover it with a small towel  and apply to injection site. Apply for 15 minutes then remove x 15 minutes. Repeat sequence on day of procedure, until you go to bed. The purpose is to minimize swelling and discomfort after procedure.  Apply heat: Apply heat to procedure site starting the day following the procedure. The purpose is to treat any soreness and discomfort from the procedure.  Food intake: Start with clear liquids (like water) and advance to regular food, as tolerated.   Physical activities: Keep activities to a minimum for the first 8 hours after the procedure.   Driving: If you have received any sedation, you are not allowed to drive for 24 hours after your procedure.  Blood thinner: Restart your blood thinner 6 hours after your procedure. (Only for those taking blood thinners)  Insulin: As soon as you can eat, you may resume your normal dosing schedule. (Only for those taking insulin)  Infection prevention: Keep procedure site clean and dry.  Post-procedure Pain Diary: Extremely important that this be done correctly and accurately. Recorded information will be used to determine the next step in treatment.  Pain evaluated is that of treated area only. Do not include pain from an untreated area.  Complete every hour, on the hour, for the initial 8 hours. Set an alarm to help you do this part accurately.  Do not go to sleep and have it completed later. It will not be accurate.  Follow-up appointment: Keep your follow-up appointment after the procedure. Usually 2 weeks for most procedures. (6 weeks in the case of radiofrequency.) Bring you pain diary.  Expect:  From numbing medicine (AKA: Local Anesthetics): Numbness or decrease in pain.  Onset: Full effect within 15 minutes of injected.  Duration: It will depend on the type of local anesthetic used. On the average, 1 to 8 hours.   From  steroids: Decrease in swelling or inflammation. Once inflammation is improved, relief of the pain will follow.  Onset of benefits: Depends on the amount of swelling present. The more swelling, the longer it will take for the benefits to be seen. In some cases, up to 10 days.  Duration: Steroids will stay in the system x 2 weeks. Duration of benefits will depend on multiple posibilities including persistent irritating factors.  From procedure: Some discomfort is to be expected once the numbing medicine wears off. This should be minimal if ice and heat are applied as instructed. Call if:  You experience numbness and weakness that gets worse with time, as opposed to wearing off.  New onset bowel or bladder incontinence. (Spinal procedures only)  Emergency Numbers:  [facility] hours (Monday - Thursday, 8:00 AM - 4:00 PM) (Friday, 9:00 AM - 12:00 Noon): [phone]  After hours: [phone] ____________________________________________________________________________________________  ____________________________________________________________________________________________  Initial Gabapentin Titration  Medication used: Gabapentin (Generic Name) or Neurontin (Brand Name) 100 mg tablets/capsules  Reasons to stop increasing the dose:  Reason 1: You get good relief of symptoms, in which case there is no need to increase the daily dose any further.    Reason 2: You develop some side effects, such as sleeping all of the time, difficulty concentrating, or becoming disoriented, in which case you need to go down on the dose, to the prior level, where you were not experiencing any side effects. Stay on that dose longer, to allow more time for your body to get use it, before attempting to increase it again.   Steps: Step 1: Start by taking 1 (one) tablet at bedtime x  7 (seven) days.  Step 2: After being on 1 (one) tablet for 7 (seven) days, then increase it to 2 (two) tablets at  bedtime for another 7 (seven) days.  Step 3: Next, after being on 2 (two) tablets at bedtime for 7 (seven) days, then increase it to 3 (three) tablets at bedtime, and stay on that dose until you see your doctor.  Reasons to stop increasing the dose: Reason 1: You get good relief of symptoms, in which case there is no need to increase the daily dose any further.  Reason 2: You develop some side effects, such as sleeping all of the time, difficulty concentrating, or becoming disoriented, in which case you need to go down on the dose, to the prior level, where you were not experiencing any side effects. Stay on that dose longer, to allow more time for your body to get use it, before attempting to increase it again.  Endpoint: Once you have reached the maximum dose you can tolerate without side-effects, contact your physician so as to evaluate the results of the regimen.   Questions: Feel free to contact us for any questions or problems at [phone] ____________________________________________________________________________________________ Preparing for your procedure (without sedation) Instructions: . Oral Intake: Do not eat or drink anything for at least 3 hours prior to your procedure. . Transportation: Unless otherwise stated by your physician, you may drive yourself after the procedure. . Blood Pressure Medicine: Take your blood pressure medicine with a sip of water the morning of the procedure. . Insulin: Take only  of your normal insulin dose. . Preventing infections: Shower with an antibacterial soap the morning of your procedure. . Build-up your immune system: Take 1000 mg of Vitamin C with every meal (3 times a day) the day prior to your procedure. . Pregnancy: If you are pregnant, call and cancel the procedure. . Sickness: If you have a cold, fever, or any active infections, call and cancel the procedure. . Arrival: You must be in the facility at least 30 minutes prior to your  scheduled procedure. . Children: Do not bring any children with you. . Dress appropriately: Bring dark clothing that you would not mind if they get stained. . Valuables: Do not bring any jewelry or valuables. Procedure appointments are reserved for interventional treatments only. [place] Kitchen No Prescription Refills. . No medication changes will be discussed during procedure appointments. No disability issues will be discussed. Post-Procedure Pain Diary   Name: Date of Service Procedure      Time Period Pain Score Painful Area  Pre-procedure ____/10   Time Period Pain Score Area improved. Area not improved.  15 to 30 min post-procedure ____/10    1st hour after procedure ____/10    2nd hour after procedure ____/10    3rd hour after procedure ____/10    4th hour after procedure ____/10    5th hour after procedure ____/10    6th hour after procedure ____/10    7th hour after procedure ____/10    Time Period Pain Score Area improved. Area not improved.  Note: From here on, always document your pain score 1st thing in the morning.  1st day after procedure ____/10    2nd day after procedure ____/10    3rd day after procedure ____/10    4th day after procedure ____/10    5th day after procedure ____/10    Time Period Pain Score Area improved. Area not improved.  10th day after procedure ____/10  20th day after procedure ____/10     Benefits Indicate for each set of activities if the procedure changes your ability to accomplish them.  Activity Worse No-Change Improved  Dressing, eating, walking, toileting, hygiene     Shopping, housekeeping, food preparation, community transportation     Range of motion of affected area      Your opinion Please indicate which statement best describes your impression of this treatment.  Statement (X)  Based on the results, I am encouraged.   Based on the results, I am disappointed.   I am not sure I have an opinion at this point.    Note: Make sure to  complete and return this form to your physician, on your follow-up appointment. This information will be used to interpret the results. Failure to accurately complete, or to return this information, may result in less than optimal outcomes.  Epidural Steroid Injection An epidural steroid injection is given to relieve pain in your neck, back, or legs that is caused by the irritation or swelling of a nerve root. This procedure involves injecting a steroid and numbing medicine (anesthetic) into the epidural space. The epidural space is the space between the outer covering of your spinal cord and the bones that form your backbone (vertebra).  LET [facility] CARE PROVIDER KNOW ABOUT:  Any allergies you have. All medicines you are taking, including vitamins, herbs, eye drops, creams, and over-the-counter medicines such as aspirin. Previous problems you or members of your family have had with the use of anesthetics. Any blood disorders or blood clotting disorders you have. Previous surgeries you have had. Medical conditions you have.  RISKS AND COMPLICATIONS Generally, this is a safe procedure. However, as with any procedure, complications can occur. Possible complications of epidural steroid injection include: Headache. Bleeding. Infection. Allergic reaction to the medicines. Damage to your nerves. The response to this procedure depends on the underlying cause of the pain and its duration. People who have long-term (chronic) pain are less likely to benefit from epidural steroids than are those people whose pain comes on strong and suddenly.  BEFORE THE PROCEDURE  Ask your health care provider about changing or stopping your regular medicines. You may be advised to stop taking blood-thinning medicines a few days before the procedure. You may be given medicines to reduce anxiety. Arrange for someone to take you home after the procedure.  PROCEDURE  You will remain awake during the procedure. You  may receive medicine to make you relaxed. You will be asked to lie on your stomach. The injection site will be cleaned. The injection site will be numbed with a medicine (local anesthetic). A needle will be injected through your skin into the epidural space. Your health care provider will use an X-ray machine to ensure that the steroid is delivered closest to the affected nerve. You may have minimal discomfort at this time. Once the needle is in the right position, the local anesthetic and the steroid will be injected into the epidural space. The needle will then be removed and a bandage will be applied to the injection site.  AFTER THE PROCEDURE  You may be monitored for a short time before you go home. You may feel weakness or numbness in your arm or leg, which disappears within hours. You may be allowed to eat, drink, and take your regular medicine. You may have soreness at the site of the injection.   This information is not intended to replace advice given to you  by your health care provider. Make sure you discuss any questions you have with your health care provider.   Document Released: 09/20/2007 Document Revised: 02/13/2013 Document Reviewed: 11/30/2012 Elsevier Interactive Patient Education 2016 [place]  What are the risk, side effects and possible complications? Generally speaking, most procedures are safe.  However, with any procedure there are risks, side effects, and the possibility of complications.  The risks and complications are dependent upon the sites that are lesioned, or the type of nerve block to be performed.  The closer the procedure is to the spine, the more serious the risks are.  Great care is taken when placing the radio frequency needles, block needles or lesioning probes, but sometimes complications can occur. Infection: Any time there is an injection through the skin, there is a risk of infection.  This is why sterile  conditions are used for these blocks. There are four possible types of infection: 1. Localized skin infection. 2. Central Nervous System Infection: This can be in the form of Meningitis, which can be deadly. 3. Epidural Infections: This can be in the form of an epidural abscess, which can cause pressure inside of the spine, causing compression of the spinal cord with subsequent paralysis. This would require an emergency surgery to decompress, and there are no guarantees that the patient would recover from the paralysis. 4. Discitis: This is an infection of the intervertebral discs. It occurs in about 1% of discography procedures. It is difficult to treat and it may lead to surgery. Pain: the needles have to go through skin and soft tissues, will cause soreness. Damage to internal structures:  The nerves to be lesioned may be near blood vessels or other nerves which can be potentially damaged. Bleeding: Bleeding is more common if the patient is taking blood thinners such as  aspirin, Coumadin, Ticiid, Plavix, etc., or if he/she have some genetic predisposition such as hemophilia. Bleeding into the spinal canal can cause compression of the spinal  cord with subsequent paralysis.  This would require an emergency surgery to decompress and there are no guarantees that the patient would recover from the paralysis. Pneumothorax: Puncturing of a lung is a possibility, every time a needle is introduced in the area of the chest or upper back.  Pneumothorax refers to free air around the collapsed lung(s), inside of the thoracic cavity (chest cavity).  Another two possible complications related to a similar event would include: Hemothorax and Chylothorax. These are variations of the Pneumothorax, where instead of air around the collapsed lung(s), you may have blood or chyle, respectively. Spinal headaches: They may occur with any procedures in the area of the spine. Persistent CSF (Cerebro-Spinal Fluid) leakage: This  is a rare problem, but may occur with prolonged intrathecal or epidural catheters either due to the formation of a fistulous track or a dural tear. Nerve damage: By working so close to the spinal cord, there is always a possibility of nerve damage, which could be as serious as a permanent spinal cord injury with paralysis. Death: Although rare, severe deadly allergic reactions known as "Anaphylactic reaction" can occur to any of the medications used. Worsening of the symptoms: We can always make thing worse.  What are the chances of something like this happening? Chances of any of this occuring are extremely low.  By statistics, you have more of a chance of getting killed in a motor vehicle accident: while driving to the hospital than any of the above  occurring .  Nevertheless, you should be aware that they are possibilities.  In general, it is similar to taking a shower.  Everybody knows that you can slip, hit your head and get killed.  Does that mean that you should not shower again?  Nevertheless always keep in mind that statistics do not mean anything if you happen to be on the wrong side of them.  Even if a procedure has a 1 (one) in a 1,000,000 (million) chance of going wrong, it you happen to be that one..Also, keep in mind that by statistics, you have more of a chance of having something go wrong when taking medications.  Who should not have this procedure? If you are on a blood thinning medication (e.g. Coumadin, Plavix, see list of "Blood Thinners"), or if you have an active infection going on, you should not have the procedure.  If you are taking any blood thinners, please inform your physician.  Preparing for your procedure: Do not eat or drink anything at least eight (8) hours prior to the procedure. Bring a driver with you .  It cannot be a taxi. Come accompanied by an adult that can drive you back, and that is strong enough to help you if your legs get weak or numb from the local  anesthetic. Take all of your medicines the morning of the procedure with just enough water to swallow them. If you have diabetes, make sure that you are scheduled to have your procedure done first thing in the morning, whenever possible. If you have diabetes, take only half of your insulin dose and notify our nurse that you have done so as soon as you arrive at the clinic. If you are diabetic, but only take blood sugar pills (oral hypoglycemic), then do not take them on the morning of your procedure.  You may take them after you have had the procedure. Do not take aspirin or any aspirin-containing medications, at least eleven (11) days prior to the procedure.  They may prolong bleeding. Wear loose fitting clothing that may be easy to take off and that you would not mind if it got stained with Betadine or blood. Do not wear any jewelry or perfume Remove any nail coloring.  It will interfere with some of our monitoring equipment. If you take Metformin for your diabetes, stop it 48 hours prior to the procedure.  NOTE: Remember that this is not meant to be interpreted as a complete list of all possible complications.  Unforeseen problems may occur.  BLOOD THINNERS The following drugs contain aspirin or other products, which can cause increased bleeding during surgery and should not be taken for 2 weeks prior to and 1 week after surgery.  If you should need take something for relief of minor pain, you may take acetaminophen which is found in Tylenol,m Datril, Anacin-3 and Panadol. It is not blood thinner. The products listed below are.  Do not take any of the products listed below in addition to any listed on your instruction sheet.  A.P.C or A.P.C with Codeine Codeine Phosphate Capsules #3 Ibuprofen Ridaura  ABC compound Congesprin Imuran rimadil  Advil Cope Indocin Robaxisal  Alka-Seltzer Effervescent Pain Reliever and Antacid Coricidin or Coricidin-D  Indomethacin Rufen  Alka-Seltzer plus Cold  Medicine Cosprin Ketoprofen S-A-C Tablets  Anacin Analgesic Tablets or Capsules Coumadin Korlgesic Salflex  Anacin Extra Strength Analgesic tablets or capsules CP-2 Tablets Lanoril Salicylate  Anaprox Cuprimine Capsules Levenox Salocol  Anexsia-D Dalteparin Magan Salsalate  Anodynos Darvon  compound Magnesium Salicylate Sine-off  Ansaid Dasin Capsules Magsal Sodium Salicylate  Anturane Depen Capsules Marnal Soma  APF Arthritis pain formula Dewitt's Pills Measurin Stanback  Argesic Dia-Gesic Meclofenamic Sulfinpyrazone  Arthritis Bayer Timed Release Aspirin Diclofenac Meclomen Sulindac  Arthritis pain formula Anacin Dicumarol Medipren Supac  Analgesic (Safety coated) Arthralgen Diffunasal Mefanamic Suprofen  Arthritis Strength Bufferin Dihydrocodeine Mepro Compound Suprol  Arthropan liquid Dopirydamole Methcarbomol with Aspirin Synalgos  ASA tablets/Enseals Disalcid Micrainin Tagament  Ascriptin Doan's Midol Talwin  Ascriptin A/D Dolene Mobidin Tanderil  Ascriptin Extra Strength Dolobid Moblgesic Ticlid  Ascriptin with Codeine Doloprin or Doloprin with Codeine Momentum Tolectin  Asperbuf Duoprin Mono-gesic Trendar  Aspergum Duradyne Motrin or Motrin IB Triminicin  Aspirin plain, buffered or enteric coated Durasal Myochrisine Trigesic  Aspirin Suppositories Easprin Nalfon Trillsate  Aspirin with Codeine Ecotrin Regular or Extra Strength Naprosyn Uracel  Atromid-S Efficin Naproxen Ursinus  Auranofin Capsules Elmiron Neocylate Vanquish  Axotal Emagrin Norgesic Verin  Azathioprine Empirin or Empirin with Codeine Normiflo Vitamin E  Azolid Emprazil Nuprin Voltaren  Bayer Aspirin plain, buffered or children's or timed BC Tablets or powders Encaprin Orgaran Warfarin Sodium  Buff-a-Comp Enoxaparin Orudis Zorpin  Buff-a-Comp with Codeine Equegesic Os-Cal-Gesic   Buffaprin Excedrin plain, buffered or Extra Strength Oxalid   Bufferin Arthritis Strength Feldene Oxphenbutazone   Bufferin plain or  Extra Strength Feldene Capsules Oxycodone with Aspirin   Bufferin with Codeine Fenoprofen Fenoprofen Pabalate or Pabalate-SF   Buffets II Flogesic Panagesic   Buffinol plain or Extra Strength Florinal or Florinal with Codeine Panwarfarin   Buf-Tabs Flurbiprofen Penicillamine   Butalbital Compound Four-way cold tablets Penicillin   Butazolidin Fragmin Pepto-Bismol   Carbenicillin Geminisyn Percodan   Carna Arthritis Reliever Geopen Persantine   Carprofen Gold's salt Persistin   Chloramphenicol Goody's Phenylbutazone   Chloromycetin Haltrain Piroxlcam   Clmetidine heparin Plaquenil   Cllnoril Hyco-pap Ponstel   Clofibrate Hydroxy chloroquine Propoxyphen         Before stopping any of these medications, be sure to consult the physician who ordered them.  Some, such as Coumadin (Warfarin) are ordered to prevent or treat serious conditions such as "deep thrombosis", "pumonary embolisms", and other heart problems.  The amount of time that you may need off of the medication may also vary with the medication and the reason for which you were taking it.  If you are taking any of these medications, please make sure you notify your pain physician before you undergo any procedures.

## 2016-12-07 NOTE — Patient Instructions (Addendum)
____________________________________________________________________________________________     Dennis Bast have Diflucan and flexeril that were e scribed to your pharmacy today.  Take as prescribed.      Post-Procedure instructions Instructions:  Apply ice: Fill a plastic sandwich bag with crushed ice. Cover it with a small towel and apply to injection site. Apply for 15 minutes then remove x 15 minutes. Repeat sequence on day of procedure, until you go to bed. The purpose is to minimize swelling and discomfort after procedure.  Apply heat: Apply heat to procedure site starting the day following the procedure. The purpose is to treat any soreness and discomfort from the procedure.  Food intake: Start with clear liquids (like water) and advance to regular food, as tolerated.   Physical activities: Keep activities to a minimum for the first 8 hours after the procedure.   Driving: If you have received any sedation, you are not allowed to drive for 24 hours after your procedure.  Blood thinner: Restart your blood thinner 6 hours after your procedure. (Only for those taking blood thinners)  Insulin: As soon as you can eat, you may resume your normal dosing schedule. (Only for those taking insulin)  Infection prevention: Keep procedure site clean and dry.  Post-procedure Pain Diary: Extremely important that this be done correctly and accurately. Recorded information will be used to determine the next step in treatment.  Pain evaluated is that of treated area only. Do not include pain from an untreated area.  Complete every hour, on the hour, for the initial 8 hours. Set an alarm to help you do this part accurately.  Do not go to sleep and have it completed later. It will not be accurate.  Follow-up appointment: Keep your follow-up appointment after the procedure. Usually 2 weeks for most procedures. (6 weeks in the case of radiofrequency.) Bring you pain diary.  Expect:  From numbing  medicine (AKA: Local Anesthetics): Numbness or decrease in pain.  Onset: Full effect within 15 minutes of injected.  Duration: It will depend on the type of local anesthetic used. On the average, 1 to 8 hours.   From steroids: Decrease in swelling or inflammation. Once inflammation is improved, relief of the pain will follow.  Onset of benefits: Depends on the amount of swelling present. The more swelling, the longer it will take for the benefits to be seen. In some cases, up to 10 days.  Duration: Steroids will stay in the system x 2 weeks. Duration of benefits will depend on multiple posibilities including persistent irritating factors.  From procedure: Some discomfort is to be expected once the numbing medicine wears off. This should be minimal if ice and heat are applied as instructed. Call if:  You experience numbness and weakness that gets worse with time, as opposed to wearing off.  New onset bowel or bladder incontinence. (Spinal procedures only)  Emergency Numbers:  [place] hours (Monday - Thursday, 8:00 AM - 4:00 PM) (Friday, 9:00 AM - 12:00 Noon): [phone]  After hours: [phone] ____________________________________________________________________________________________  ____________________________________________________________________________________________  Initial Gabapentin Titration  Medication used: Gabapentin (Generic Name) or Neurontin (Brand Name) 100 mg tablets/capsules  Reasons to stop increasing the dose:  Reason 1: You get good relief of symptoms, in which case there is no need to increase the daily dose any further.    Reason 2: You develop some side effects, such as sleeping all of the time, difficulty concentrating, or becoming disoriented, in which case you need to go down on the dose, to  the prior level, where you were not experiencing any side effects. Stay on that dose longer, to allow more time for your body to get use it,  before attempting to increase it again.   Steps: Step 1: Start by taking 1 (one) tablet at bedtime x 7 (seven) days.  Step 2: After being on 1 (one) tablet for 7 (seven) days, then increase it to 2 (two) tablets at bedtime for another 7 (seven) days.  Step 3: Next, after being on 2 (two) tablets at bedtime for 7 (seven) days, then increase it to 3 (three) tablets at bedtime, and stay on that dose until you see your doctor.  Reasons to stop increasing the dose: Reason 1: You get good relief of symptoms, in which case there is no need to increase the daily dose any further.  Reason 2: You develop some side effects, such as sleeping all of the time, difficulty concentrating, or becoming disoriented, in which case you need to go down on the dose, to the prior level, where you were not experiencing any side effects. Stay on that dose longer, to allow more time for your body to get use it, before attempting to increase it again.  Endpoint: Once you have reached the maximum dose you can tolerate without side-effects, contact your physician so as to evaluate the results of the regimen.   Questions: Feel free to contact us for any questions or problems at [phone] ____________________________________________________________________________________________ Preparing for your procedure (without sedation) Instructions: . Oral Intake: Do not eat or drink anything for at least 3 hours prior to your procedure. . Transportation: Unless otherwise stated by your physician, you may drive yourself after the procedure. . Blood Pressure Medicine: Take your blood pressure medicine with a sip of water the morning of the procedure. . Insulin: Take only  of your normal insulin dose. . Preventing infections: Shower with an antibacterial soap the morning of your procedure. . Build-up your immune system: Take 1000 mg of Vitamin C with every meal (3 times a day) the day prior to your procedure. . Pregnancy: If  you are pregnant, call and cancel the procedure. . Sickness: If you have a cold, fever, or any active infections, call and cancel the procedure. . Arrival: You must be in the facility at least 30 minutes prior to your scheduled procedure. . Children: Do not bring any children with you. . Dress appropriately: Bring dark clothing that you would not mind if they get stained. . Valuables: Do not bring any jewelry or valuables. Procedure appointments are reserved for interventional treatments only. Mar[facility] Kitchen No Prescription Refills. . No medication changes will be discussed during procedure appointments. No disability issues will be discussed. Post-Procedure Pain Diary   Name: Date of Service Procedure      Time Period Pain Score Painful Area  Pre-procedure ____/10   Time Period Pain Score Area improved. Area not improved.  15 to 30 min post-procedure ____/10    1st hour after procedure ____/10    2nd hour after procedure ____/10    3rd hour after procedure ____/10    4th hour after procedure ____/10    5th hour after procedure ____/10    6th hour after procedure ____/10    7th hour after procedure ____/10    Time Period Pain Score Area improved. Area not improved.  Note: From here on, always document your pain score 1st thing in the morning.  1st day after procedure ____/10    2nd day after  procedure ____/10    3rd day after procedure ____/10    4th day after procedure ____/10    5th day after procedure ____/10    Time Period Pain Score Area improved. Area not improved.  10th day after procedure ____/10    20th day after procedure ____/10     Benefits Indicate for each set of activities if the procedure changes your ability to accomplish them.  Activity Worse No-Change Improved  Dressing, eating, walking, toileting, hygiene     Shopping, housekeeping, food preparation, community transportation     Range of motion of affected area      Your opinion Please indicate which statement best  describes your impression of this treatment.  Statement (X)  Based on the results, I am encouraged.   Based on the results, I am disappointed.   I am not sure I have an opinion at this point.    Note: Make sure to complete and return this form to your physician, on your follow-up appointment. This information will be used to interpret the results. Failure to accurately complete, or to return this information, may result in less than optimal outcomes.  Epidural Steroid Injection An epidural steroid injection is given to relieve pain in your neck, back, or legs that is caused by the irritation or swelling of a nerve root. This procedure involves injecting a steroid and numbing medicine (anesthetic) into the epidural space. The epidural space is the space between the outer covering of your spinal cord and the bones that form your backbone (vertebra).  LET [facility] CARE PROVIDER KNOW ABOUT:  Any allergies you have. All medicines you are taking, including vitamins, herbs, eye drops, creams, and over-the-counter medicines such as aspirin. Previous problems you or members of your family have had with the use of anesthetics. Any blood disorders or blood clotting disorders you have. Previous surgeries you have had. Medical conditions you have.  RISKS AND COMPLICATIONS Generally, this is a safe procedure. However, as with any procedure, complications can occur. Possible complications of epidural steroid injection include: Headache. Bleeding. Infection. Allergic reaction to the medicines. Damage to your nerves. The response to this procedure depends on the underlying cause of the pain and its duration. People who have long-term (chronic) pain are less likely to benefit from epidural steroids than are those people whose pain comes on strong and suddenly.  BEFORE THE PROCEDURE  Ask your health care provider about changing or stopping your regular medicines. You may be advised to stop taking  blood-thinning medicines a few days before the procedure. You may be given medicines to reduce anxiety. Arrange for someone to take you home after the procedure.  PROCEDURE  You will remain awake during the procedure. You may receive medicine to make you relaxed. You will be asked to lie on your stomach. The injection site will be cleaned. The injection site will be numbed with a medicine (local anesthetic). A needle will be injected through your skin into the epidural space. Your health care provider will use an X-ray machine to ensure that the steroid is delivered closest to the affected nerve. You may have minimal discomfort at this time. Once the needle is in the right position, the local anesthetic and the steroid will be injected into the epidural space. The needle will then be removed and a bandage will be applied to the injection site.  AFTER THE PROCEDURE  You may be monitored for a short time before you go home. You may feel  weakness or numbness in your arm or leg, which disappears within hours. You may be allowed to eat, drink, and take your regular medicine. You may have soreness at the site of the injection.   This information is not intended to replace advice given to you by your health care provider. Make sure you discuss any questions you have with your health care provider.   Document Released: 09/20/2007 Document Revised: 02/13/2013 Document Reviewed: 11/30/2012 Elsevier Interactive Patient Education 2016 [place]  What are the risk, side effects and possible complications? Generally speaking, most procedures are safe.  However, with any procedure there are risks, side effects, and the possibility of complications.  The risks and complications are dependent upon the sites that are lesioned, or the type of nerve block to be performed.  The closer the procedure is to the spine, the more serious the risks are.  Great care is taken when  placing the radio frequency needles, block needles or lesioning probes, but sometimes complications can occur. Infection: Any time there is an injection through the skin, there is a risk of infection.  This is why sterile conditions are used for these blocks. There are four possible types of infection: 1. Localized skin infection. 2. Central Nervous System Infection: This can be in the form of Meningitis, which can be deadly. 3. Epidural Infections: This can be in the form of an epidural abscess, which can cause pressure inside of the spine, causing compression of the spinal cord with subsequent paralysis. This would require an emergency surgery to decompress, and there are no guarantees that the patient would recover from the paralysis. 4. Discitis: This is an infection of the intervertebral discs. It occurs in about 1% of discography procedures. It is difficult to treat and it may lead to surgery. Pain: the needles have to go through skin and soft tissues, will cause soreness. Damage to internal structures:  The nerves to be lesioned may be near blood vessels or other nerves which can be potentially damaged. Bleeding: Bleeding is more common if the patient is taking blood thinners such as  aspirin, Coumadin, Ticiid, Plavix, etc., or if he/she have some genetic predisposition such as hemophilia. Bleeding into the spinal canal can cause compression of the spinal  cord with subsequent paralysis.  This would require an emergency surgery to decompress and there are no guarantees that the patient would recover from the paralysis. Pneumothorax: Puncturing of a lung is a possibility, every time a needle is introduced in the area of the chest or upper back.  Pneumothorax refers to free air around the collapsed lung(s), inside of the thoracic cavity (chest cavity).  Another two possible complications related to a similar event would include: Hemothorax and Chylothorax. These are variations of the Pneumothorax,  where instead of air around the collapsed lung(s), you may have blood or chyle, respectively. Spinal headaches: They may occur with any procedures in the area of the spine. Persistent CSF (Cerebro-Spinal Fluid) leakage: This is a rare problem, but may occur with prolonged intrathecal or epidural catheters either due to the formation of a fistulous track or a dural tear. Nerve damage: By working so close to the spinal cord, there is always a possibility of nerve damage, which could be as serious as a permanent spinal cord injury with paralysis. Death: Although rare, severe deadly allergic reactions known as "Anaphylactic reaction" can occur to any of the medications used. Worsening of the symptoms: We can always make thing  worse.  What are the chances of something like this happening? Chances of any of this occuring are extremely low.  By statistics, you have more of a chance of getting killed in a motor vehicle accident: while driving to the hospital than any of the above occurring .  Nevertheless, you should be aware that they are possibilities.  In general, it is similar to taking a shower.  Everybody knows that you can slip, hit your head and get killed.  Does that mean that you should not shower again?  Nevertheless always keep in mind that statistics do not mean anything if you happen to be on the wrong side of them.  Even if a procedure has a 1 (one) in a 1,000,000 (million) chance of going wrong, it you happen to be that one..Also, keep in mind that by statistics, you have more of a chance of having something go wrong when taking medications.  Who should not have this procedure? If you are on a blood thinning medication (e.g. Coumadin, Plavix, see list of "Blood Thinners"), or if you have an active infection going on, you should not have the procedure.  If you are taking any blood thinners, please inform your physician.  Preparing for your procedure: Do not eat or drink anything at least eight  (8) hours prior to the procedure. Bring a driver with you .  It cannot be a taxi. Come accompanied by an adult that can drive you back, and that is strong enough to help you if your legs get weak or numb from the local anesthetic. Take all of your medicines the morning of the procedure with just enough water to swallow them. If you have diabetes, make sure that you are scheduled to have your procedure done first thing in the morning, whenever possible. If you have diabetes, take only half of your insulin dose and notify our nurse that you have done so as soon as you arrive at the clinic. If you are diabetic, but only take blood sugar pills (oral hypoglycemic), then do not take them on the morning of your procedure.  You may take them after you have had the procedure. Do not take aspirin or any aspirin-containing medications, at least eleven (11) days prior to the procedure.  They may prolong bleeding. Wear loose fitting clothing that may be easy to take off and that you would not mind if it got stained with Betadine or blood. Do not wear any jewelry or perfume Remove any nail coloring.  It will interfere with some of our monitoring equipment. If you take Metformin for your diabetes, stop it 48 hours prior to the procedure.  NOTE: Remember that this is not meant to be interpreted as a complete list of all possible complications.  Unforeseen problems may occur.  BLOOD THINNERS The following drugs contain aspirin or other products, which can cause increased bleeding during surgery and should not be taken for 2 weeks prior to and 1 week after surgery.  If you should need take something for relief of minor pain, you may take acetaminophen which is found in Tylenol,m Datril, Anacin-3 and Panadol. It is not blood thinner. The products listed below are.  Do not take any of the products listed below in addition to any listed on your instruction sheet.  A.P.C or A.P.C with Codeine Codeine Phosphate Capsules  #3 Ibuprofen Ridaura  ABC compound Congesprin Imuran rimadil  Advil Cope Indocin Robaxisal  Alka-Seltzer Effervescent Pain Reliever and Antacid Coricidin or Coricidin-D  Indomethacin Rufen  Alka-Seltzer plus Cold Medicine Cosprin Ketoprofen S-A-C Tablets  Anacin Analgesic Tablets or Capsules Coumadin Korlgesic Salflex  Anacin Extra Strength Analgesic tablets or capsules CP-2 Tablets Lanoril Salicylate  Anaprox Cuprimine Capsules Levenox Salocol  Anexsia-D Dalteparin Magan Salsalate  Anodynos Darvon compound Magnesium Salicylate Sine-off  Ansaid Dasin Capsules Magsal Sodium Salicylate  Anturane Depen Capsules Marnal Soma  APF Arthritis pain formula Dewitt's Pills Measurin Stanback  Argesic Dia-Gesic Meclofenamic Sulfinpyrazone  Arthritis Bayer Timed Release Aspirin Diclofenac Meclomen Sulindac  Arthritis pain formula Anacin Dicumarol Medipren Supac  Analgesic (Safety coated) Arthralgen Diffunasal Mefanamic Suprofen  Arthritis Strength Bufferin Dihydrocodeine Mepro Compound Suprol  Arthropan liquid Dopirydamole Methcarbomol with Aspirin Synalgos  ASA tablets/Enseals Disalcid Micrainin Tagament  Ascriptin Doan's Midol Talwin  Ascriptin A/D Dolene Mobidin Tanderil  Ascriptin Extra Strength Dolobid Moblgesic Ticlid  Ascriptin with Codeine Doloprin or Doloprin with Codeine Momentum Tolectin  Asperbuf Duoprin Mono-gesic Trendar  Aspergum Duradyne Motrin or Motrin IB Triminicin  Aspirin plain, buffered or enteric coated Durasal Myochrisine Trigesic  Aspirin Suppositories Easprin Nalfon Trillsate  Aspirin with Codeine Ecotrin Regular or Extra Strength Naprosyn Uracel  Atromid-S Efficin Naproxen Ursinus  Auranofin Capsules Elmiron Neocylate Vanquish  Axotal Emagrin Norgesic Verin  Azathioprine Empirin or Empirin with Codeine Normiflo Vitamin E  Azolid Emprazil Nuprin Voltaren  Bayer Aspirin plain, buffered or children's or timed BC Tablets or powders Encaprin Orgaran Warfarin Sodium   Buff-a-Comp Enoxaparin Orudis Zorpin  Buff-a-Comp with Codeine Equegesic Os-Cal-Gesic   Buffaprin Excedrin plain, buffered or Extra Strength Oxalid   Bufferin Arthritis Strength Feldene Oxphenbutazone   Bufferin plain or Extra Strength Feldene Capsules Oxycodone with Aspirin   Bufferin with Codeine Fenoprofen Fenoprofen Pabalate or Pabalate-SF   Buffets II Flogesic Panagesic   Buffinol plain or Extra Strength Florinal or Florinal with Codeine Panwarfarin   Buf-Tabs Flurbiprofen Penicillamine   Butalbital Compound Four-way cold tablets Penicillin   Butazolidin Fragmin Pepto-Bismol   Carbenicillin Geminisyn Percodan   Carna Arthritis Reliever Geopen Persantine   Carprofen Gold's salt Persistin   Chloramphenicol Goody's Phenylbutazone   Chloromycetin Haltrain Piroxlcam   Clmetidine heparin Plaquenil   Cllnoril Hyco-pap Ponstel   Clofibrate Hydroxy chloroquine Propoxyphen         Before stopping any of these medications, be sure to consult the physician who ordered them.  Some, such as Coumadin (Warfarin) are ordered to prevent or treat serious conditions such as "deep thrombosis", "pumonary embolisms", and other heart problems.  The amount of time that you may need off of the medication may also vary with the medication and the reason for which you were taking it.  If you are taking any of these medications, please make sure you notify your pain physician before you undergo any procedures.

## 2016-12-08 ENCOUNTER — Telehealth: Payer: Self-pay | Admitting: *Deleted

## 2016-12-08 NOTE — Telephone Encounter (Signed)
No problems post procedure. 

## 2016-12-09 ENCOUNTER — Other Ambulatory Visit (HOSPITAL_COMMUNITY)
Admission: RE | Admit: 2016-12-09 | Discharge: 2016-12-09 | Disposition: A | Discharge status: Home / Self Care | Payer: 59 | Source: Ambulatory Visit | Attending: Family Medicine | Admitting: Family Medicine

## 2016-12-09 ENCOUNTER — Encounter: Payer: Self-pay | Admitting: Family Medicine

## 2016-12-09 ENCOUNTER — Ambulatory Visit (INDEPENDENT_AMBULATORY_CARE_PROVIDER_SITE_OTHER): Payer: 59 | Admitting: Family Medicine

## 2016-12-09 VITALS — BP 110/78 | HR 75 | Temp 98.3°F | Resp 16 | Ht 62.0 in | Wt 250.1 lb

## 2016-12-09 DIAGNOSIS — Z1239 Encounter for other screening for malignant neoplasm of breast: Secondary | ICD-10-CM

## 2016-12-09 DIAGNOSIS — Z1231 Encounter for screening mammogram for malignant neoplasm of breast: Secondary | ICD-10-CM | POA: Diagnosis not present

## 2016-12-09 DIAGNOSIS — Z0000 Encounter for general adult medical examination without abnormal findings: Secondary | ICD-10-CM | POA: Diagnosis not present

## 2016-12-09 DIAGNOSIS — Z124 Encounter for screening for malignant neoplasm of cervix: Secondary | ICD-10-CM | POA: Diagnosis not present

## 2016-12-09 HISTORY — DX: Encounter for general adult medical examination without abnormal findings: Z00.00

## 2016-12-09 LAB — LIPID PANEL
Cholesterol: 204 mg/dL — ABNORMAL HIGH (ref 0–200)
HDL: 62.7 mg/dL (ref >39.00)
LDL CALC: 122 mg/dL — AB (ref 0–99)
NonHDL: 140.99
TRIGLYCERIDES: 95 mg/dL (ref 0.0–149.0)
Total CHOL/HDL Ratio: 3
VLDL: 19 mg/dL (ref 0.0–40.0)

## 2016-12-09 LAB — CBC
HCT: 39.9 % (ref 36.0–46.0)
Hemoglobin: 13.5 g/dL (ref 12.0–15.0)
MCHC: 33.9 g/dL (ref 30.0–36.0)
MCV: 88 fl (ref 78.0–100.0)
Platelets: 318 10*3/uL (ref 150.0–400.0)
RBC: 4.53 Mil/uL (ref 3.87–5.11)
RDW: 14.9 % (ref 11.5–15.5)
WBC: 11 10*3/uL — ABNORMAL HIGH (ref 4.0–10.5)

## 2016-12-09 LAB — COMPREHENSIVE METABOLIC PANEL
ALT: 13 U/L (ref 0–35)
AST: 12 U/L (ref 0–37)
Albumin: 4.1 g/dL (ref 3.5–5.2)
Alkaline Phosphatase: 59 U/L (ref 39–117)
BUN: 23 mg/dL (ref 6–23)
CALCIUM: 9.5 mg/dL (ref 8.4–10.5)
CHLORIDE: 106 meq/L (ref 96–112)
CO2: 23 meq/L (ref 19–32)
CREATININE: 0.81 mg/dL (ref 0.40–1.20)
GFR: 80.24 mL/min (ref >60.00)
Glucose, Bld: 98 mg/dL (ref 70–99)
POTASSIUM: 3.8 meq/L (ref 3.5–5.1)
SODIUM: 139 meq/L (ref 135–145)
Total Bilirubin: 0.3 mg/dL (ref 0.2–1.2)
Total Protein: 6.8 g/dL (ref 6.0–8.3)

## 2016-12-09 LAB — TSH: TSH: 3.31 u[IU]/mL (ref 0.35–4.50)

## 2016-12-09 LAB — HEMOGLOBIN A1C: Hgb A1c MFr Bld: 5.6 % (ref 4.6–6.5)

## 2016-12-09 NOTE — Patient Instructions (Signed)

## 2016-12-09 NOTE — Progress Notes (Signed)
Subjective:  Patient ID: Sarah [name], female    DOB: Mar 02, 1971  Age: 46 y.o. MRN: [id number]  CC: Annual physical exam  HPI Sarah [name] is a 46 y.o. female presents to the clinic today for an annual physical exam.  Preventative Healthcare  Pap smear: Reports a history of dysplasia but is unsure of the severity. She is status post hysterectomy. Needs Pap smear given history of dysplasia.  Mammogram: In need of.  Colonoscopy: Up to date.  Immunizations  Tetanus - Up to date.  Labs: Screening labs today.  Alcohol use: Occ.  Smoking/tobacco use: Former.  STD/HIV testing: Declines.   PMH, Surgical Hx, Family Hx, Social History reviewed and updated as below.  Past Medical History:  Diagnosis Date  . Allergy   . Chicken pox   . Chronic pain   . GERD (gastroesophageal reflux disease)   . History of kidney stones   . Shingles    Past Surgical History:  Procedure Laterality Date  . ABDOMINAL HYSTERECTOMY  01/2001  . CESAREAN SECTION  12/1987  . CESAREAN SECTION  05/1993  . COLONOSCOPY WITH PROPOFOL N/A 01/29/2016   Procedure: COLONOSCOPY WITH PROPOFOL;  Surgeon: Manya Silvas, MD;  Location: [facility] ENDOSCOPY;  Service: Endoscopy;  Laterality: N/A;   Family History  Problem Relation Age of Onset  . Breast cancer Mother   . Colon cancer Father   . Lung cancer Father   . Diabetes Father   . Hypertension Paternal Grandfather   . Breast cancer Maternal Grandmother    Social History  Substance Use Topics  . Smoking status: Former Smoker    Quit date: 06/27/2010  . Smokeless tobacco: Never Used     Comment: quit 2015---currently using e cig  . Alcohol use 0.0 oz/week     Comment: occasional    Review of Systems  Gastrointestinal:       Occasional lower abdominal pain.  All other systems reviewed and are negative.   Objective:   Today's Vitals: BP 110/78 (BP Location: Left Arm, Patient Position: Sitting, Cuff Size: Large)   Pulse 75   Temp 98.3 F (36.8  C) (Oral)   Resp 16   Ht 5\' 2"  (1.575 m)   Wt 250 lb 2 oz (113.5 kg)   SpO2 98%   BMI 45.75 kg/m   Physical Exam  Constitutional: She is oriented to person, place, and time. She appears well-developed and well-nourished. No distress.  HENT:  Head: Normocephalic and atraumatic.  Nose: Nose normal.  Mouth/Throat: Oropharynx is clear and moist. No oropharyngeal exudate.  Normal TM's bilaterally.   Eyes: Conjunctivae are normal. No scleral icterus.  Neck: Neck supple.  Cardiovascular: Normal rate and regular rhythm.   No murmur heard. Pulmonary/Chest: Effort normal and breath sounds normal. She has no wheezes. She has no rales.  Abdominal: Soft. She exhibits no distension. There is no tenderness. There is no rebound and no guarding.  Genitourinary:  Genitourinary Comments: Pelvic Exam: External: normal female genitalia without lesions or masses Vagina: normal without lesions or masses Cervix: Absent (s/p Hysterectomy) Pap smear: [name] from cuff obtained.    Lymphadenopathy:    She has no cervical adenopathy.  Neurological: She is alert and oriented to person, place, and time.  Skin: Skin is warm and dry. No rash noted.  Psychiatric: She has a normal mood and affect.  Vitals reviewed.  Assessment & Plan:   Problem List Items Addressed This Visit    Annual physical exam - Primary  Pap smear performed today. Declined HIV screening. Screening labs today. No vaccines needed. Mammogram ordered.      Relevant Orders   CBC   Hemoglobin A1c   Comprehensive metabolic panel   Lipid panel   TSH    Other Visit Diagnoses    Cervical cancer screening       Relevant Orders   Cytology - PAP   Breast cancer screening       Relevant Orders   MM Digital Screening      Meds ordered this encounter  Medications  . fluticasone (FLONASE) 50 MCG/ACT nasal spray    Sig: Place 1 spray into both nostrils daily.    Follow-up: Return in about 1 year (around 12/09/2017).  [place]

## 2016-12-09 NOTE — Assessment & Plan Note (Signed)
Pap smear performed today. Declined HIV screening. Screening labs today. No vaccines needed. Mammogram ordered.

## 2016-12-13 LAB — CYTOLOGY - PAP
DIAGNOSIS: NEGATIVE
HPV (WINDOPATH): NOT DETECTED

## 2016-12-22 NOTE — Progress Notes (Signed)
Results were reviewed and found to be: abnormal  No acute injury or pathology identified  Review would suggest interventional pain management techniques may be of benefit

## 2017-01-17 ENCOUNTER — Ambulatory Visit
Admission: RE | Admit: 2017-01-17 | Discharge: 2017-01-17 | Disposition: A | Discharge status: Home / Self Care | Payer: 59 | Source: Ambulatory Visit | Attending: Family Medicine | Admitting: Family Medicine

## 2017-01-17 DIAGNOSIS — Z1231 Encounter for screening mammogram for malignant neoplasm of breast: Secondary | ICD-10-CM | POA: Diagnosis not present

## 2017-02-22 ENCOUNTER — Other Ambulatory Visit: Payer: Self-pay | Admitting: Pain Medicine

## 2017-02-22 DIAGNOSIS — M5412 Radiculopathy, cervical region: Secondary | ICD-10-CM

## 2017-02-22 DIAGNOSIS — M792 Neuralgia and neuritis, unspecified: Secondary | ICD-10-CM

## 2017-02-22 DIAGNOSIS — M7918 Myalgia, other site: Secondary | ICD-10-CM

## 2017-02-22 DIAGNOSIS — M542 Cervicalgia: Secondary | ICD-10-CM

## 2017-02-22 MED ORDER — CYCLOBENZAPRINE HCL 10 MG PO TABS: 10.0000 mg | ORAL_TABLET | Freq: Every day | ORAL | 1 refills | Status: DC

## 2017-02-22 MED ORDER — TRAMADOL HCL 50 MG PO TABS: 50.0000 mg | ORAL_TABLET | Freq: Four times a day (QID) | ORAL | 2 refills | Status: DC | PRN

## 2017-02-22 MED ORDER — GABAPENTIN 100 MG PO CAPS: 100.0000 mg | ORAL_CAPSULE | Freq: Every day | ORAL | 1 refills | Status: DC

## 2017-04-28 DIAGNOSIS — H524 Presbyopia: Secondary | ICD-10-CM | POA: Diagnosis not present

## 2017-05-25 ENCOUNTER — Other Ambulatory Visit: Payer: Self-pay | Admitting: Pain Medicine

## 2017-05-25 DIAGNOSIS — M792 Neuralgia and neuritis, unspecified: Secondary | ICD-10-CM

## 2017-08-07 ENCOUNTER — Other Ambulatory Visit: Payer: Self-pay | Admitting: Pain Medicine

## 2017-08-07 DIAGNOSIS — M542 Cervicalgia: Secondary | ICD-10-CM

## 2017-08-07 DIAGNOSIS — M792 Neuralgia and neuritis, unspecified: Secondary | ICD-10-CM

## 2017-08-07 DIAGNOSIS — M7918 Myalgia, other site: Secondary | ICD-10-CM

## 2017-08-07 MED ORDER — CYCLOBENZAPRINE HCL 10 MG PO TABS: 10.0000 mg | ORAL_TABLET | Freq: Every day | ORAL | 1 refills | Status: DC

## 2017-08-07 MED ORDER — GABAPENTIN 300 MG PO CAPS: 300.0000 mg | ORAL_CAPSULE | Freq: Three times a day (TID) | ORAL | 5 refills | Status: DC

## 2017-08-07 MED ORDER — TRAMADOL HCL 50 MG PO TABS: 50.0000 mg | ORAL_TABLET | Freq: Four times a day (QID) | ORAL | 2 refills | Status: DC | PRN

## 2017-12-15 ENCOUNTER — Encounter: Payer: Self-pay | Admitting: Internal Medicine

## 2017-12-15 ENCOUNTER — Ambulatory Visit (INDEPENDENT_AMBULATORY_CARE_PROVIDER_SITE_OTHER): Payer: No Typology Code available for payment source | Admitting: Internal Medicine

## 2017-12-15 VITALS — BP 112/76 | HR 81 | Temp 98.7°F | Ht 62.0 in | Wt 243.4 lb

## 2017-12-15 DIAGNOSIS — Z1389 Encounter for screening for other disorder: Secondary | ICD-10-CM | POA: Diagnosis not present

## 2017-12-15 DIAGNOSIS — Z6841 Body Mass Index (BMI) 40.0 and over, adult: Secondary | ICD-10-CM | POA: Insufficient documentation

## 2017-12-15 DIAGNOSIS — G8929 Other chronic pain: Secondary | ICD-10-CM

## 2017-12-15 DIAGNOSIS — E785 Hyperlipidemia, unspecified: Secondary | ICD-10-CM | POA: Diagnosis not present

## 2017-12-15 DIAGNOSIS — Z1159 Encounter for screening for other viral diseases: Secondary | ICD-10-CM

## 2017-12-15 DIAGNOSIS — Z1231 Encounter for screening mammogram for malignant neoplasm of breast: Secondary | ICD-10-CM

## 2017-12-15 DIAGNOSIS — E66813 Obesity, class 3: Secondary | ICD-10-CM | POA: Insufficient documentation

## 2017-12-15 DIAGNOSIS — Z0184 Encounter for antibody response examination: Secondary | ICD-10-CM

## 2017-12-15 DIAGNOSIS — M545 Low back pain, unspecified: Secondary | ICD-10-CM

## 2017-12-15 DIAGNOSIS — Z1283 Encounter for screening for malignant neoplasm of skin: Secondary | ICD-10-CM

## 2017-12-15 DIAGNOSIS — M25552 Pain in left hip: Secondary | ICD-10-CM | POA: Diagnosis not present

## 2017-12-15 DIAGNOSIS — R739 Hyperglycemia, unspecified: Secondary | ICD-10-CM

## 2017-12-15 DIAGNOSIS — Z0000 Encounter for general adult medical examination without abnormal findings: Secondary | ICD-10-CM

## 2017-12-15 DIAGNOSIS — Z1329 Encounter for screening for other suspected endocrine disorder: Secondary | ICD-10-CM | POA: Diagnosis not present

## 2017-12-15 NOTE — Progress Notes (Signed)
Pre visit review using our clinic review tool, if applicable. No additional management support is needed unless otherwise documented below in the visit note. 

## 2017-12-15 NOTE — Progress Notes (Signed)
Chief Complaint  Patient presents with  . Annual Exam   Annual exam  1. No complaints  2. Obesity weight unchanged  3. Has chronic b/l low and low back pain 3/10 today   Review of Systems  Constitutional: Negative for weight loss.  HENT: Negative for hearing loss.   Eyes: Negative for blurred vision.  Respiratory: Negative for shortness of breath.   Cardiovascular: Negative for chest pain.  Gastrointestinal: Negative for abdominal pain and blood in stool.  Musculoskeletal: Positive for back pain and joint pain.  Skin: Negative for rash.  Neurological: Negative for headaches.  Psychiatric/Behavioral: Negative for depression.   Past Medical History:  Diagnosis Date  . Allergy   . Chicken pox   . Chronic pain   . GERD (gastroesophageal reflux disease)   . History of kidney stones   . Hyperlipidemia   . Shingles    Past Surgical History:  Procedure Laterality Date  . ABDOMINAL HYSTERECTOMY  01/2001  . CESAREAN SECTION  12/1987  . CESAREAN SECTION  05/1993  . COLONOSCOPY WITH PROPOFOL N/A 01/29/2016   Procedure: COLONOSCOPY WITH PROPOFOL;  Surgeon: Manya Silvas, MD;  Location: [facility] ENDOSCOPY;  Service: Endoscopy;  Laterality: N/A;   Family History  Problem Relation Age of Onset  . Breast cancer Mother   . Cancer Mother        breast  . Colon cancer Father   . Lung cancer Father   . Diabetes Father   . Hypertension Paternal Grandfather   . Breast cancer Maternal Grandmother        30's  . Cancer Maternal Grandmother        breast   . Cancer Cousin        breast    Social History   Socioeconomic History  . Marital status: Married    Spouse name: Not on file  . Number of children: Not on file  . Years of education: Not on file  . Highest education level: Not on file  Occupational History  . Not on file  Social Needs  . Financial resource strain: Not on file  . Food insecurity:    Worry: Not on file    Inability: Not on file  . Transportation needs:   Medical: Not on file    Non-medical: Not on file  Tobacco Use  . Smoking status: Former Smoker    Last attempt to quit: 06/27/2010    Years since quitting: 7.4  . Smokeless tobacco: Never Used  . Tobacco comment: quit 2015---currently using e cig  Substance and Sexual Activity  . Alcohol use: Yes    Alcohol/week: 0.0 oz    Comment: occasional  . Drug use: No  . Sexual activity: Yes    Partners: Male    Birth control/protection: Surgical    Comment: Husband  Lifestyle  . Physical activity:    Days per week: Not on file    Minutes per session: Not on file  . Stress: Not on file  Relationships  . Social connections:    Talks on phone: Not on file    Gets together: Not on file    Attends religious service: Not on file    Active member of club or organization: Not on file    Attends meetings of clubs or organizations: Not on file    Relationship status: Not on file  . Intimate partner violence:    Fear of current or ex partner: Not on file    Emotionally abused:  Not on file    Physically abused: Not on file    Forced sexual activity: Not on file  Other Topics Concern  . 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 b complex vitamins tablet Take 1 tablet by mouth daily.  . cyclobenzaprine (FLEXERIL) 10 MG tablet Take 1 tablet (10 mg total) by mouth at bedtime.  . gabapentin (NEURONTIN) 300 MG capsule Take 1-3 capsules (300-900 mg total) by mouth every 8 (eight) hours.  Marland Kitchen loratadine (CLARITIN) 10 MG tablet Take 10 mg by mouth daily.  . magnesium oxide (MAG-OX) 400 MG tablet Take 400 mg by mouth daily.  . ranitidine (ZANTAC) 150 MG capsule Take 150 mg by mouth 2 (two) times daily.  . [DISCONTINUED] fluticasone (FLONASE) 50 MCG/ACT nasal spray Place 1 spray into both nostrils daily.  .  [DISCONTINUED] Melatonin 3 MG TABS Take 10 mg by mouth Nightly.   Allergies  Allergen Reactions  . Aspirin     bleeding  . Sulfa Antibiotics Hives   No results found for this or any previous visit (from the past 2160 hour(s)). Objective  Body mass index is 44.52 kg/m. Wt Readings from Last 3 Encounters:  12/15/17 243 lb 6.4 oz (110.4 kg)  12/09/16 250 lb 2 oz (113.5 kg)  12/07/16 220 lb (99.8 kg)   Temp Readings from Last 3 Encounters:  12/15/17 98.7 F (37.1 C) (Oral)  12/09/16 98.3 F (36.8 C) (Oral)  12/07/16 97.5 F (36.4 C)   BP Readings from Last 3 Encounters:  12/15/17 112/76  12/09/16 110/78  12/07/16 (!) 127/98   Pulse Readings from Last 3 Encounters:  12/15/17 81  12/09/16 75  12/07/16 85    Physical Exam  Constitutional: She is oriented to person, place, and time. Vital signs are normal. She appears well-developed and well-nourished. She is cooperative.  HENT:  Head: Normocephalic and atraumatic.  Mouth/Throat: Oropharynx is clear and moist and mucous membranes are normal.  Eyes: Pupils are equal, round, and reactive to light. Conjunctivae are normal.  Cardiovascular: Normal rate, regular rhythm and normal heart sounds.  Pulmonary/Chest: Effort normal and breath sounds normal. Right breast exhibits no inverted nipple, no mass, no nipple discharge, no skin change and no tenderness. Left breast exhibits no inverted nipple, no mass, no nipple discharge, no skin change and no tenderness. No breast swelling, tenderness, discharge or bleeding. Breasts are symmetrical.  Abdominal: Soft. Normal appearance and bowel sounds are normal. There is no tenderness.  Neurological: She is alert and oriented to person, place, and time. Gait normal.  Skin: Skin is warm, dry and intact.  Psychiatric: She has a normal mood and affect. Her speech is normal and behavior is normal. Judgment and thought content normal. Cognition and memory are normal.  Nursing note and vitals  reviewed.   Assessment   1. Annual exam  2. Obesity 44.52/HLD 3. Chronic hip and low back pain  Plan   1. Had flu shot  Tdap had 12/07/11 Declines vit D, STD testing   Pap had 12/09/16 neg pap neg HPV Referral mammo breast exam normal FH breast cancer  Referred dermatology  Disc healthy diet choices, exercise and sunscreen   2. Given cholesterol info rec water aerobics  Will  not due adipex due to palpitations had seen Dr. Ubaldo Glassing years ago  3. May wean off gabapentin 300 tid, prn flexeril    Provider: Dr. Olivia Mackie McLean-Scocuzza-Internal Medicine

## 2017-12-15 NOTE — Patient Instructions (Addendum)
Follow up in 6 months  Please get fasting labs at the hospital     Exercising to Lose Weight Exercising can help you to lose weight. In order to lose weight through exercise, you need to do vigorous-intensity exercise. You can tell that you are exercising with vigorous intensity if you are breathing very hard and fast and cannot hold a conversation while exercising. Moderate-intensity exercise helps to maintain your current weight. You can tell that you are exercising at a moderate level if you have a higher heart rate and faster breathing, but you are still able to hold a conversation. How often should I exercise? Choose an activity that you enjoy and set realistic goals. Your health care provider can help you to make an activity plan that works for you. Exercise regularly as directed by your health care provider. This may include:  Doing resistance training twice each week, such as: ? Push-ups. ? Sit-ups. ? Lifting weights. ? Using resistance bands.  Doing a given intensity of exercise for a given amount of time. Choose from these options: ? 150 minutes of moderate-intensity exercise every week. ? 75 minutes of vigorous-intensity exercise every week. ? A mix of moderate-intensity and vigorous-intensity exercise every week.  Children, pregnant women, people who are out of shape, people who are overweight, and older adults may need to consult a health care provider for individual recommendations. If you have any sort of medical condition, be sure to consult your health care provider before starting a new exercise program. What are some activities that can help me to lose weight?  Walking at a rate of at least 4.5 miles an hour.  Jogging or running at a rate of 5 miles per hour.  Biking at a rate of at least 10 miles per hour.  Lap swimming.  Roller-skating or in-line skating.  Cross-country skiing.  Vigorous competitive sports, such as football, basketball, and soccer.  Jumping  rope.  Aerobic dancing. How can I be more active in my day-to-day activities?  Use the stairs instead of the elevator.  Take a walk during your lunch break.  If you drive, park your car farther away from work or school.  If you take public transportation, get off one stop early and walk the rest of the way.  Make all of your phone calls while standing up and walking around.  Get up, stretch, and walk around every 30 minutes throughout the day. What guidelines should I follow while exercising?  Do not exercise so much that you hurt yourself, feel dizzy, or get very short of breath.  Consult your health care provider prior to starting a new exercise program.  Wear comfortable clothes and shoes with good support.  Drink plenty of water while you exercise to prevent dehydration or heat stroke. Body water is lost during exercise and must be replaced.  Work out until you breathe faster and your heart beats faster. This information is not intended to replace advice given to you by your health care provider. Make sure you discuss any questions you have with your health care provider. Document Released: 07/16/2010 Document Revised: 11/19/2015 Document Reviewed: 11/14/2013 Elsevier Interactive Patient Education  2018 [employer].  Cholesterol Cholesterol is a white, waxy, fat-like substance that is needed by the human body in small amounts. The liver makes all the cholesterol we need. Cholesterol is carried from the liver by the blood through the blood vessels. Deposits of cholesterol (plaques) may build up on blood vessel (artery) walls. Plaques  make the arteries narrower and stiffer. Cholesterol plaques increase the risk for heart attack and stroke. You cannot feel your cholesterol level even if it is very high. The only way to know that it is high is to have a blood test. Once you know your cholesterol levels, you should keep a record of the test results. Work with your health care  provider to keep your levels in the desired range. What do the results mean?  Total cholesterol is a rough measure of all the cholesterol in your blood.  LDL (low-density lipoprotein) is the "bad" cholesterol. This is the type that causes plaque to build up on the artery walls. You want this level to be low.  HDL (high-density lipoprotein) is the "good" cholesterol because it cleans the arteries and carries the LDL away. You want this level to be high.  Triglycerides are fat that the body can either burn for energy or store. High levels are closely linked to heart disease. What are the desired levels of cholesterol?  Total cholesterol below 200.  LDL below 100 for people who are at risk, below 70 for people at very high risk.  HDL above 40 is good. A level of 60 or higher is considered to be protective against heart disease.  Triglycerides below 150. How can I lower my cholesterol? Diet Follow your diet program as told by your health care provider.  Choose fish or white meat chicken and [place], roasted or baked. Limit fatty cuts of red meat, fried foods, and processed meats, such as sausage and lunch meats.  Eat lots of fresh fruits and vegetables.  Choose whole grains, beans, pasta, potatoes, and cereals.  Choose olive oil, corn oil, or canola oil, and use only small amounts.  Avoid butter, mayonnaise, shortening, or palm kernel oils.  Avoid foods with trans fats.  Drink skim or nonfat milk and eat low-fat or nonfat yogurt and cheeses. Avoid whole milk, cream, ice cream, egg yolks, and full-fat cheeses.  Healthier desserts include angel food cake, ginger snaps, animal crackers, hard candy, popsicles, and low-fat or nonfat frozen yogurt. Avoid pastries, cakes, pies, and cookies.  Exercise  Follow your exercise program as told by your health care provider. A regular program: ? Helps to decrease LDL and raise HDL. ? Helps with weight control.  Do things that increase your  activity level, such as gardening, walking, and taking the stairs.  Ask your health care provider about ways that you can be more active in your daily life.  Medicine  Take over-the-counter and prescription medicines only as told by your health care provider. ? Medicine may be prescribed by your health care provider to help lower cholesterol and decrease the risk for heart disease. This is usually done if diet and exercise have failed to bring down cholesterol levels. ? If you have several risk factors, you may need medicine even if your levels are normal.  This information is not intended to replace advice given to you by your health care provider. Make sure you discuss any questions you have with your health care provider. Document Released: 03/08/2001 Document Revised: 01/09/2016 Document Reviewed: 12/12/2015 Elsevier Interactive Patient Education  [employer].

## 2017-12-19 ENCOUNTER — Other Ambulatory Visit
Admission: RE | Admit: 2017-12-19 | Discharge: 2017-12-19 | Disposition: A | Discharge status: Home / Self Care | Payer: No Typology Code available for payment source | Source: Ambulatory Visit | Attending: Internal Medicine | Admitting: Internal Medicine

## 2017-12-19 DIAGNOSIS — R739 Hyperglycemia, unspecified: Secondary | ICD-10-CM | POA: Insufficient documentation

## 2017-12-19 DIAGNOSIS — Z1159 Encounter for screening for other viral diseases: Secondary | ICD-10-CM | POA: Diagnosis present

## 2017-12-19 DIAGNOSIS — Z0000 Encounter for general adult medical examination without abnormal findings: Secondary | ICD-10-CM | POA: Diagnosis present

## 2017-12-19 DIAGNOSIS — E785 Hyperlipidemia, unspecified: Secondary | ICD-10-CM | POA: Insufficient documentation

## 2017-12-19 DIAGNOSIS — Z1389 Encounter for screening for other disorder: Secondary | ICD-10-CM | POA: Diagnosis present

## 2017-12-19 DIAGNOSIS — Z1329 Encounter for screening for other suspected endocrine disorder: Secondary | ICD-10-CM | POA: Insufficient documentation

## 2017-12-19 DIAGNOSIS — Z0184 Encounter for antibody response examination: Secondary | ICD-10-CM | POA: Insufficient documentation

## 2017-12-19 LAB — CBC WITH DIFFERENTIAL/PLATELET
BASOS PCT: 1 %
Basophils Absolute: 0 10*3/uL (ref 0–0.1)
Eosinophils Absolute: 0.1 10*3/uL (ref 0–0.7)
Eosinophils Relative: 2 %
HEMATOCRIT: 41.2 % (ref 35.0–47.0)
HEMOGLOBIN: 14.1 g/dL (ref 12.0–16.0)
Lymphocytes Relative: 42 %
Lymphs Abs: 2.5 10*3/uL (ref 1.0–3.6)
MCH: 29.8 pg (ref 26.0–34.0)
MCHC: 34.1 g/dL (ref 32.0–36.0)
MCV: 87.2 fL (ref 80.0–100.0)
MONO ABS: 0.4 10*3/uL (ref 0.2–0.9)
MONOS PCT: 7 %
NEUTROS ABS: 2.9 10*3/uL (ref 1.4–6.5)
NEUTROS PCT: 48 %
Platelets: 258 10*3/uL (ref 150–440)
RBC: 4.73 MIL/uL (ref 3.80–5.20)
RDW: 14.5 % (ref 11.5–14.5)
WBC: 6 10*3/uL (ref 3.6–11.0)

## 2017-12-19 LAB — COMPREHENSIVE METABOLIC PANEL
ALBUMIN: 4.1 g/dL (ref 3.5–5.0)
ALK PHOS: 60 U/L (ref 38–126)
ALT: 16 U/L (ref 0–44)
ANION GAP: 9 (ref 5–15)
AST: 17 U/L (ref 15–41)
BUN: 22 mg/dL — ABNORMAL HIGH (ref 6–20)
CALCIUM: 8.9 mg/dL (ref 8.9–10.3)
CO2: 21 mmol/L — ABNORMAL LOW (ref 22–32)
Chloride: 107 mmol/L (ref 98–111)
Creatinine, Ser: 0.69 mg/dL (ref 0.44–1.00)
GFR calc Af Amer: >60 mL/min (ref >60)
GLUCOSE: 98 mg/dL (ref 70–99)
POTASSIUM: 4.1 mmol/L (ref 3.5–5.1)
Sodium: 137 mmol/L (ref 135–145)
TOTAL PROTEIN: 6.7 g/dL (ref 6.5–8.1)
Total Bilirubin: 0.7 mg/dL (ref 0.3–1.2)

## 2017-12-19 LAB — URINALYSIS, ROUTINE W REFLEX MICROSCOPIC
BILIRUBIN URINE: NEGATIVE
Glucose, UA: NEGATIVE mg/dL
HGB URINE DIPSTICK: NEGATIVE
KETONES UR: 5 mg/dL — AB
Leukocytes, UA: NEGATIVE
Nitrite: NEGATIVE
PROTEIN: NEGATIVE mg/dL
SPECIFIC GRAVITY, URINE: 1.024 (ref 1.005–1.030)
pH: 5 (ref 5.0–8.0)

## 2017-12-19 LAB — LIPID PANEL
CHOLESTEROL: 207 mg/dL — AB (ref 0–200)
HDL: 54 mg/dL (ref >40)
LDL Cholesterol: 137 mg/dL — ABNORMAL HIGH (ref 0–99)
TRIGLYCERIDES: 82 mg/dL (ref <150)
Total CHOL/HDL Ratio: 3.8 RATIO
VLDL: 16 mg/dL (ref 0–40)

## 2017-12-19 LAB — T4, FREE: FREE T4: 0.78 ng/dL — AB (ref 0.82–1.77)

## 2017-12-19 LAB — HEMOGLOBIN A1C
Hgb A1c MFr Bld: 4.9 % (ref 4.8–5.6)
MEAN PLASMA GLUCOSE: 93.93 mg/dL

## 2017-12-19 LAB — TSH: TSH: 2.361 u[IU]/mL (ref 0.350–4.500)

## 2017-12-20 LAB — HEPATITIS B SURFACE ANTIBODY, QUANTITATIVE: Hepatitis B-Post: 66.2 m[IU]/mL (ref >9.9)

## 2017-12-20 LAB — MEASLES/MUMPS/RUBELLA IMMUNITY
MUMPS IGG: 10 [AU]/ml — AB (ref >10.9)
RUBELLA: 19 {index} (ref >0.99)
RUBEOLA IGG: 39.1 [AU]/ml (ref >29.9)

## 2018-01-25 ENCOUNTER — Ambulatory Visit
Admission: RE | Admit: 2018-01-25 | Discharge: 2018-01-25 | Disposition: A | Discharge status: Home / Self Care | Payer: No Typology Code available for payment source | Source: Ambulatory Visit | Attending: Internal Medicine | Admitting: Internal Medicine

## 2018-01-25 DIAGNOSIS — Z1231 Encounter for screening mammogram for malignant neoplasm of breast: Secondary | ICD-10-CM | POA: Diagnosis present

## 2018-01-31 ENCOUNTER — Ambulatory Visit (INDEPENDENT_AMBULATORY_CARE_PROVIDER_SITE_OTHER): Payer: No Typology Code available for payment source | Admitting: Family

## 2018-01-31 ENCOUNTER — Encounter: Payer: Self-pay | Admitting: Internal Medicine

## 2018-01-31 ENCOUNTER — Encounter: Payer: Self-pay | Admitting: Family

## 2018-01-31 VITALS — BP 108/70 | HR 79 | Temp 97.9°F | Resp 15 | Wt 238.0 lb

## 2018-01-31 DIAGNOSIS — M546 Pain in thoracic spine: Secondary | ICD-10-CM | POA: Diagnosis not present

## 2018-01-31 MED ORDER — PREDNISONE 10 MG PO TABS: ORAL_TABLET | ORAL | 0 refills | Status: DC

## 2018-01-31 NOTE — Patient Instructions (Addendum)
Trial of prednisone   HEAT HEAT. I suspect strained muscle.   Please stay vigilant for other signs which may suggest gallbladder, kidney source as discussed at length  If there is no improvement in your symptoms, or if there is any worsening of symptoms, or if you have any additional concerns, please return for re-evaluation; or, if we are closed, consider going to the Emergency Room for evaluation if symptoms urgent.

## 2018-01-31 NOTE — Progress Notes (Signed)
Subjective:    Patient ID: Sarah [name], female    DOB: September 03, 1970, 47 y.o.   MRN: [id number]  CC: [name] is a 47 y.o. female who presents today for an acute visit.    HPI: Complains of right sided rib/back pain x 5 days, unchanged.  Wraps around toward stomach.  NO sob, pain with inspiration. Pain is not related to meals. Pain is sharp, catches, stab. Pain with movement, however random. Twisting bothers back. This pain is different.   Tried tramadol, flexeril, ibuprofen 800mg   Thinks walking her dog and dog pulled her on leash to right. No falls nor heard loud pops.   No dysuria, rash, hematuria.   chronic low back pain, with sciatica on left side.   No longer on gabapentin.   H/o renal stones.      HISTORY:  Past Medical History:  Diagnosis Date  . Allergy   . Chicken pox   . Chronic pain   . GERD (gastroesophageal reflux disease)   . History of kidney stones   . Hyperlipidemia   . Shingles    Past Surgical History:  Procedure Laterality Date  . ABDOMINAL HYSTERECTOMY  01/2001  . CESAREAN SECTION  12/1987  . CESAREAN SECTION  05/1993  . COLONOSCOPY WITH PROPOFOL N/A 01/29/2016   Procedure: COLONOSCOPY WITH PROPOFOL;  Surgeon: Manya Silvas, MD;  [facility] END[facility]COPY;  Service: Endoscopy;  Laterality: N/A;   Family History  Problem Relation Age of Onset  . Breast cancer Mother 70  . Cancer Mother        breast  . Colon cancer Father   . Lung cancer Father   . Diabetes Father   . Hypertension Paternal Grandfather   . Breast cancer Maternal Grandmother        30's  . Cancer Maternal Grandmother        breast   . Cancer Cousin        breast     Allergies: Aspirin and Sulfa antibiotics Current Outpatient Medications on File Prior to Visit  Medication Sig Dispense Refill  . b complex vitamins tablet Take 1 tablet by mouth daily.    . Cholecalciferol (VITAMIN D3) 2000 units CHEW Chew by mouth daily.    . cyclobenzaprine (FLEXERIL) 10 MG  tablet Take 1 tablet (10 mg total) by mouth at bedtime. 90 tablet 1  . loratadine (CLARITIN) 10 MG tablet Take 10 mg by mouth daily.    . magnesium oxide (MAG-OX) 400 MG tablet Take 400 mg by mouth daily.    . ranitidine (ZANTAC) 150 MG capsule Take 150 mg by mouth 2 (two) times daily.    . traMADol (ULTRAM) 50 MG tablet Take 1 tablet (50 mg total) by mouth every 6 (six) hours as needed for up to 15 days for severe pain. 60 tablet 2   No current facility-administered medications on file prior to visit.     Social History   Tobacco Use  . Smoking status: Former Smoker    Last attempt to quit: 06/27/2010    Years since quitting: 7.6  . Smokeless tobacco: Never Used  . Tobacco comment: quit 2015---currently using e cig  Substance Use Topics  . Alcohol use: Yes    Alcohol/week: 0.0 standard drinks    Comment: occasional  . Drug use: No    Review of Systems  Constitutional: Negative for chills and fever.  Respiratory: Negative for cough.   Cardiovascular: Negative for chest pain and palpitations.  Gastrointestinal: Negative for abdominal distention, abdominal pain, blood in stool, constipation, nausea and vomiting.  Genitourinary: Negative for dysuria, flank pain and hematuria.  Musculoskeletal: Positive for back pain (chronic).  Neurological: Positive for numbness (chronic. left leg).      Objective:    BP 108/70 (BP Location: Left Arm, Patient Position: Sitting, Cuff Size: Large)   Pulse 79   Temp 97.9 F (36.6 C) (Oral)   Resp 15   Wt 238 lb (108 kg)   SpO2 99%   BMI 43.53 kg/m    Physical Exam  Constitutional: She appears well-developed and well-nourished.  Eyes: Conjunctivae are normal.  Cardiovascular: Normal rate, regular rhythm, normal heart sounds and normal pulses.  Pulmonary/Chest: Effort normal and breath sounds normal. She has no wheezes. She has no rhonchi. She has no rales.  Abdominal: There is no CVA tenderness and negative Murphy's sign.    Musculoskeletal:       Thoracic back: She exhibits normal range of motion, no tenderness, no bony tenderness, no swelling and no edema.       Lumbar back: She exhibits normal range of motion, no tenderness, no bony tenderness, no swelling, no edema, no pain and no spasm.       Back:  Area of pain is described by patient marked on diagram.  No tenderness elicited on exam.  No rash, asymmetry, masses appreciated.    Full range of motion with flexion, tension, lateral side bends. No bony tenderness.   Neurological: She is alert. She has normal strength. No sensory deficit.  Reflex Scores:      Patellar reflexes are 2+ on the right side and 2+ on the left side. Sensation and strength intact bilateral lower extremities.  Skin: Skin is warm and dry.  Psychiatric: She has a normal mood and affect. Her speech is normal and behavior is normal. Thought content normal.  Vitals reviewed.      Assessment & Plan:   1. Acute right-sided thoracic back pain Suspect muscle spasm after walking the dog, as onset correlates with this incident.  Reasonable to trial prednisone.  Advised rest, heat.  Discussed with patient utility of x-rays, low suspicion for any fracture at this point.  She declines UA nor does patient suspect gallbladder etiology. Negative murphy's sign so I was comfortable with not pursing labs, Ab [place].   Patient will let me know how she is doing on prednisone, and emphasized to her if no improvement, we need to pursue imaging, consult with orthopedics.   - predniSONE (DELTASONE) 10 MG tablet; Take 4 tablets ( total 40 mg) by mouth for 2 days; take 3 tablets ( total 30 mg) by mouth for 2 days; take 2 tablets ( total 20 mg) by mouth for 1 day; take 1 tablet ( total 10 mg) by mouth for 1 day.  Dispense: 17 tablet; Refill: 0    I have discontinued Jacqulyn Ducking. Baugher's gabapentin. I am also having her start on predniSONE. Additionally, I am having her maintain her loratadine, ranitidine, magnesium  oxide, b complex vitamins, traMADol, cyclobenzaprine, and Vitamin D3.   Meds ordered this encounter  Medications  . predniSONE (DELTASONE) 10 MG tablet    Sig: Take 4 tablets ( total 40 mg) by mouth for 2 days; take 3 tablets ( total 30 mg) by mouth for 2 days; take 2 tablets ( total 20 mg) by mouth for 1 day; take 1 tablet ( total 10 mg) by mouth for 1 day.    Dispense:  17 tablet    Refill:  0    Order Specific Question:   Supervising Provider    Answer:   Crecencio Mc [[id number]]    Return precautions given.   Risks, benefits, and alternatives of the medications and treatment plan prescribed today were discussed, and patient expressed understanding.   Education regarding symptom management and diagnosis given to patient on AVS.  Continue to follow with McLean-Scocuzza, Nino Glow, MD for routine health maintenance.   Jacqulyn Ducking Seidenberg and I agreed with plan.   Mable Paris, FNP

## 2018-02-13 ENCOUNTER — Encounter: Payer: Self-pay | Admitting: Family

## 2018-02-13 DIAGNOSIS — M7918 Myalgia, other site: Secondary | ICD-10-CM

## 2018-02-13 DIAGNOSIS — M546 Pain in thoracic spine: Secondary | ICD-10-CM

## 2018-02-13 NOTE — Telephone Encounter (Signed)
Last office visit 01-31-18

## 2018-06-18 ENCOUNTER — Ambulatory Visit: Payer: No Typology Code available for payment source | Admitting: Internal Medicine

## 2018-06-26 ENCOUNTER — Ambulatory Visit: Payer: No Typology Code available for payment source | Admitting: Internal Medicine

## 2018-06-29 ENCOUNTER — Ambulatory Visit: Payer: No Typology Code available for payment source | Admitting: Internal Medicine

## 2018-08-17 ENCOUNTER — Ambulatory Visit: Payer: No Typology Code available for payment source | Admitting: Internal Medicine

## 2018-09-02 ENCOUNTER — Telehealth: Payer: No Typology Code available for payment source | Admitting: Family

## 2018-09-02 DIAGNOSIS — J019 Acute sinusitis, unspecified: Secondary | ICD-10-CM | POA: Diagnosis not present

## 2018-09-02 MED ORDER — AMOXICILLIN-POT CLAVULANATE 875-125 MG PO TABS: 1.0000 | ORAL_TABLET | Freq: Two times a day (BID) | ORAL | 0 refills | Status: DC

## 2018-09-02 MED ORDER — FLUCONAZOLE 150 MG PO TABS: 150.0000 mg | ORAL_TABLET | Freq: Once | ORAL | 0 refills | Status: AC

## 2018-09-02 NOTE — Progress Notes (Signed)
We are sorry that you are not feeling well.  Here is how we plan to help!  Based on what you have shared with me it looks like you have sinusitis.  Sinusitis is inflammation and infection in the sinus cavities of the head.  Based on your presentation I believe you most likely have Acute Bacterial Sinusitis.  This is an infection caused by bacteria and is treated with antibiotics. I have prescribed Augmentin 875mg /125mg  one tablet twice daily with food, for 7 days. You may use an oral decongestant such as Mucinex D or if you have glaucoma or high blood pressure use plain Mucinex. Saline nasal spray help and can safely be used as often as needed for congestion.  If you develop worsening sinus pain, fever or notice severe headache and vision changes, or if symptoms are not better after completion of antibiotic, please schedule an appointment with a health care provider.    Approximately five minutes was spent reviewing the document in patient's chart.  Sinus infections are not as easily transmitted as other respiratory infection, however we still recommend that you avoid close contact with loved ones, especially the very young and elderly.  Remember to wash your hands thoroughly throughout the day as this is the number one way to prevent the spread of infection!  Home Care:  Only take medications as instructed by your medical team.  Complete the entire course of an antibiotic.  Do not take these medications with alcohol.  A steam or ultrasonic humidifier can help congestion.  You can place a towel over your head and breathe in the steam from hot water coming from a faucet.  Avoid close contacts especially the very young and the elderly.  Cover your mouth when you cough or sneeze.  Always remember to wash your hands.  Get Help Right Away If:  You develop worsening fever or sinus pain.  You develop a severe head ache or visual changes.  Your symptoms persist after you have completed your  treatment plan.  Make sure you  Understand these instructions.  Will watch your condition.  Will get help right away if you are not doing well or get worse.  Your e-visit answers were reviewed by a board certified advanced clinical practitioner to complete your personal care plan.  Depending on the condition, your plan could have included both over the counter or prescription medications.  If there is a problem please reply  once you have received a response from your provider.  Your safety is important to [place].  If you have drug allergies check your prescription carefully.    You can use MyChart to ask questions about today's visit, request a non-urgent call back, or ask for a work or school excuse for 24 hours related to this e-Visit. If it has been greater than 24 hours you will need to follow up with your provider, or enter a new e-Visit to address those concerns.  You will get an e-mail in the next two days asking about your experience.  I hope that your e-visit has been valuable and will speed your recovery. Thank you for using e-visits.

## 2018-09-02 NOTE — Addendum Note (Signed)
Addended by: Evelina Dun A on: 09/02/2018 10:22 AM   Modules accepted: Orders

## 2018-09-13 ENCOUNTER — Encounter: Payer: Self-pay | Admitting: Internal Medicine

## 2018-09-19 ENCOUNTER — Ambulatory Visit: Payer: No Typology Code available for payment source | Admitting: Internal Medicine

## 2018-09-23 IMAGING — MG MM DIGITAL SCREENING BILAT W/ TOMO W/ CAD
6 of 10 series · 6 of 30 positions shown · non-contrast
Comparison: Previous exam(s).

CLINICAL DATA: Screening.

EXAM:
DIGITAL SCREENING BILATERAL MAMMOGRAM WITH TOMO AND CAD

[L MLO synth-2D]
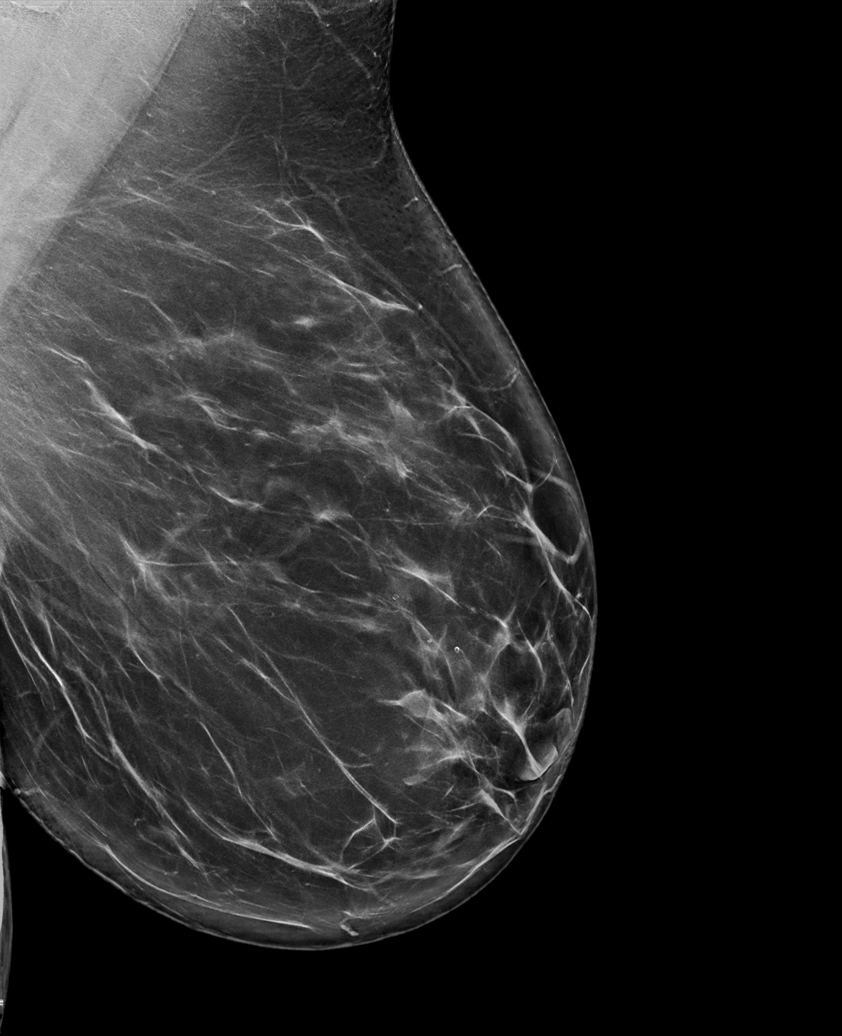

[L CC synth-2D]
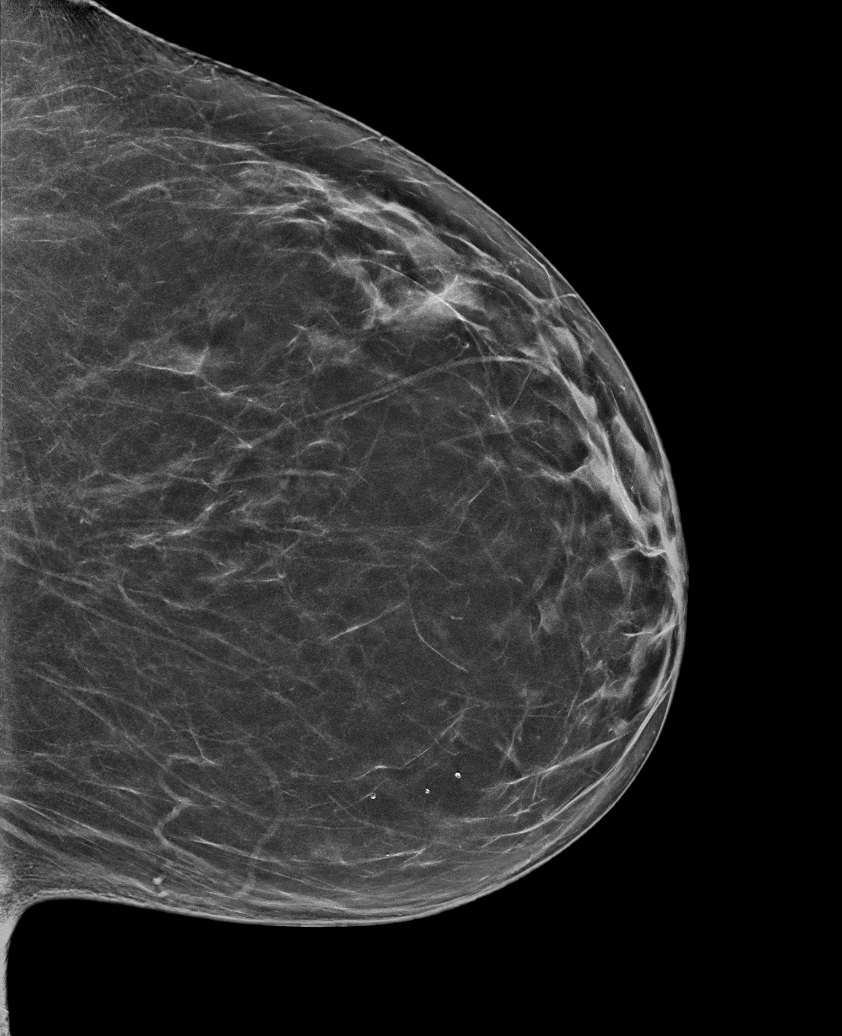

[R CC synth-2D]
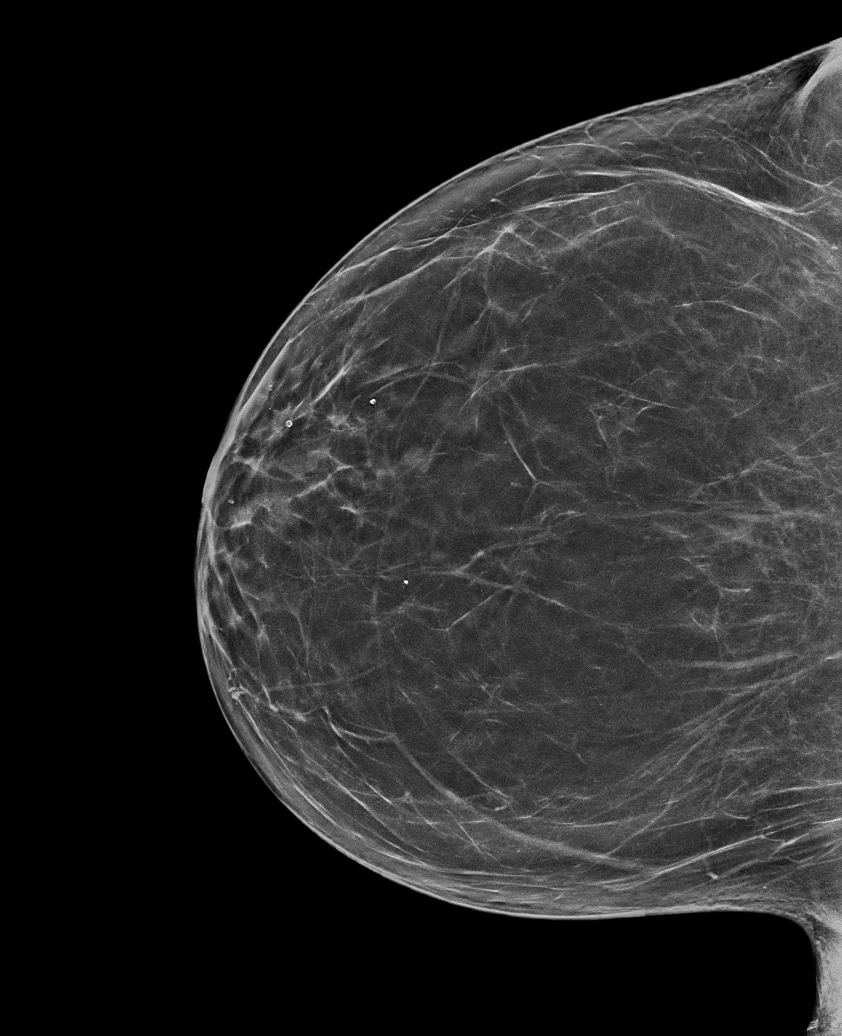

[R MLO synth-2D (1 of 2)]
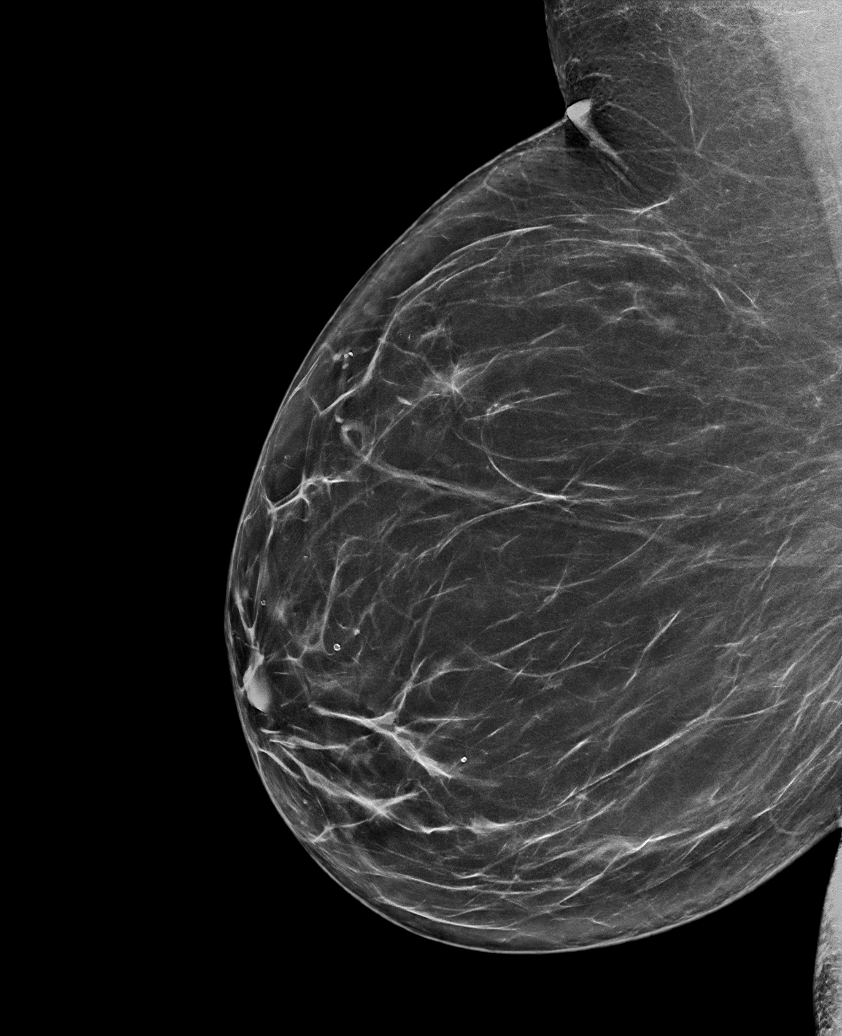

[R MLO synth-2D (2 of 2)]
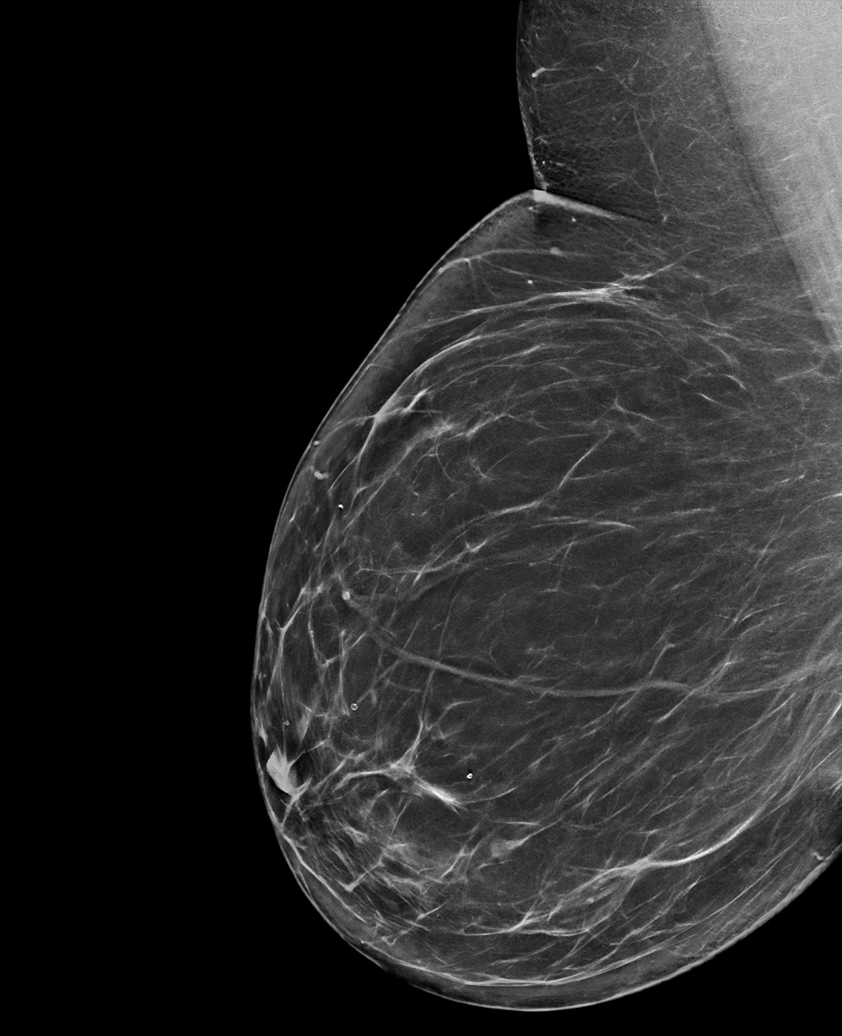

[R MLO tomo · tomo slice 41/80.0]
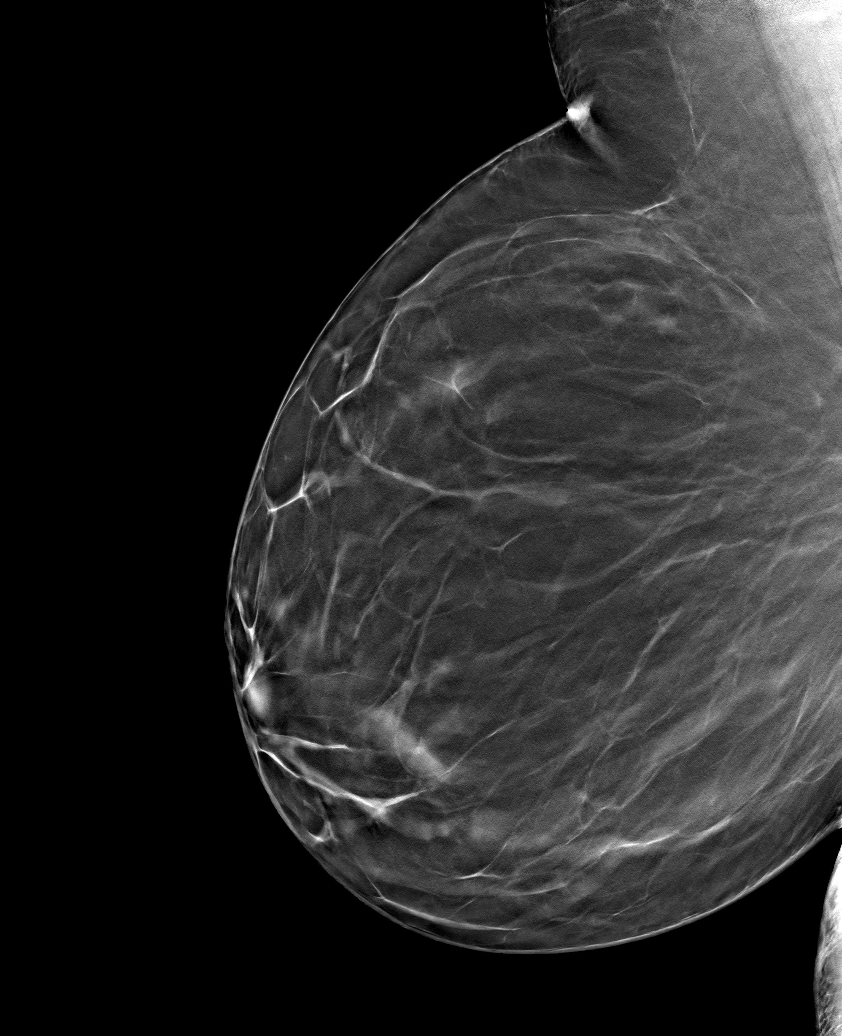

[6 of 30 positions shown; findings below may reference images not displayed]

ACR Breast Density Category b: There are scattered areas of
fibroglandular density.
FINDINGS: There are no findings suspicious for malignancy. Images were
processed with CAD.
IMPRESSION: No mammographic evidence of malignancy. A result letter of this
screening mammogram will be mailed directly to the patient.

RECOMMENDATION:
Screening mammogram in one year. (Code:[id number])

BI-RADS CATEGORY  1: Negative.

## 2018-10-08 ENCOUNTER — Other Ambulatory Visit: Payer: Self-pay | Admitting: Pain Medicine

## 2018-10-08 DIAGNOSIS — M542 Cervicalgia: Secondary | ICD-10-CM

## 2018-10-08 DIAGNOSIS — G894 Chronic pain syndrome: Secondary | ICD-10-CM | POA: Insufficient documentation

## 2018-10-08 HISTORY — DX: Chronic pain syndrome: G89.4

## 2018-10-08 MED ORDER — TRAMADOL HCL 50 MG PO TABS: 50.0000 mg | ORAL_TABLET | Freq: Four times a day (QID) | ORAL | 0 refills | Status: DC | PRN

## 2018-10-08 NOTE — Telephone Encounter (Signed)
Given to Dr Dossie Arbour for refill

## 2018-11-30 ENCOUNTER — Ambulatory Visit: Payer: No Typology Code available for payment source | Admitting: Internal Medicine

## 2019-01-03 NOTE — Telephone Encounter (Signed)
Already don

## 2019-02-22 ENCOUNTER — Encounter: Payer: Self-pay | Admitting: Internal Medicine

## 2019-02-22 ENCOUNTER — Ambulatory Visit (INDEPENDENT_AMBULATORY_CARE_PROVIDER_SITE_OTHER): Payer: No Typology Code available for payment source | Admitting: Internal Medicine

## 2019-02-22 ENCOUNTER — Other Ambulatory Visit: Payer: Self-pay

## 2019-02-22 VITALS — BP 110/82 | Ht 62.0 in | Wt 238.0 lb

## 2019-02-22 DIAGNOSIS — K219 Gastro-esophageal reflux disease without esophagitis: Secondary | ICD-10-CM | POA: Diagnosis not present

## 2019-02-22 DIAGNOSIS — Z1329 Encounter for screening for other suspected endocrine disorder: Secondary | ICD-10-CM

## 2019-02-22 DIAGNOSIS — Z13220 Encounter for screening for lipoid disorders: Secondary | ICD-10-CM | POA: Diagnosis not present

## 2019-02-22 DIAGNOSIS — Z1389 Encounter for screening for other disorder: Secondary | ICD-10-CM

## 2019-02-22 DIAGNOSIS — Z0000 Encounter for general adult medical examination without abnormal findings: Secondary | ICD-10-CM

## 2019-02-22 DIAGNOSIS — Z1231 Encounter for screening mammogram for malignant neoplasm of breast: Secondary | ICD-10-CM

## 2019-02-22 DIAGNOSIS — E559 Vitamin D deficiency, unspecified: Secondary | ICD-10-CM

## 2019-02-22 DIAGNOSIS — G4700 Insomnia, unspecified: Secondary | ICD-10-CM

## 2019-02-22 MED ORDER — PANTOPRAZOLE SODIUM 40 MG PO TBEC: 40.0000 mg | DELAYED_RELEASE_TABLET | Freq: Every day | ORAL | 3 refills | Status: DC

## 2019-02-22 NOTE — Patient Instructions (Signed)
Gastroesophageal Reflux Disease, Adult Gastroesophageal reflux (GER) happens when acid from the stomach flows up into the tube that connects the mouth and the stomach (esophagus). Normally, food travels down the esophagus and stays in the stomach to be digested. However, when a person has GER, food and stomach acid sometimes move back up into the esophagus. If this becomes a more serious problem, the person may be diagnosed with a disease called gastroesophageal reflux disease (GERD). GERD occurs when the reflux:  Happens often.  Causes frequent or severe symptoms.  Causes problems such as damage to the esophagus. When stomach acid comes in contact with the esophagus, the acid may cause soreness (inflammation) in the esophagus. Over time, GERD may create small holes (ulcers) in the lining of the esophagus. What are the causes? This condition is caused by a problem with the muscle between the esophagus and the stomach (lower esophageal sphincter, or LES). Normally, the LES muscle closes after food passes through the esophagus to the stomach. When the LES is weakened or abnormal, it does not close properly, and that allows food and stomach acid to go back up into the esophagus. The LES can be weakened by certain dietary substances, medicines, and medical conditions, including:  Tobacco use.  Pregnancy.  Having a hiatal hernia.  Alcohol use.  Certain foods and beverages, such as coffee, chocolate, onions, and peppermint. What increases the risk? You are more likely to develop this condition if you:  Have an increased body weight.  Have a connective tissue disorder.  Use NSAID medicines. What are the signs or symptoms? Symptoms of this condition include:  Heartburn.  Difficult or painful swallowing.  The feeling of having a lump in the throat.  Abitter taste in the mouth.  Bad breath.  Having a large amount of saliva.  Having an upset or bloated stomach.  Belching.   Chest pain. Different conditions can cause chest pain. Make sure you see your health care provider if you experience chest pain.  Shortness of breath or wheezing.  Ongoing (chronic) cough or a night-time cough.  Wearing away of tooth enamel.  Weight loss. How is this diagnosed? Your health care provider will take a medical history and perform a physical exam. To determine if you have mild or severe GERD, your health care provider may also monitor how you respond to treatment. You may also have tests, including:  A test to examine your stomach and esophagus with a small camera (endoscopy).  A test thatmeasures the acidity level in your esophagus.  A test thatmeasures how much pressure is on your esophagus.  A barium swallow or modified barium swallow test to show the shape, size, and functioning of your esophagus. How is this treated? The goal of treatment is to help relieve your symptoms and to prevent complications. Treatment for this condition may vary depending on how severe your symptoms are. Your health care provider may recommend:  Changes to your diet.  Medicine.  Surgery. Follow these instructions at home: Eating and drinking   Follow a diet as recommended by your health care provider. This may involve avoiding foods and drinks such as: ? Coffee and tea (with or without caffeine). ? Drinks that containalcohol. ? Energy drinks and sports drinks. ? Carbonated drinks or sodas. ? Chocolate and cocoa. ? Peppermint and mint flavorings. ? Garlic and onions. ? Horseradish. ? Spicy and acidic foods, including peppers, chili powder, curry powder, vinegar, hot sauces, and barbecue sauce. ? Citrus fruit juices and citrus  fruits, such as oranges, lemons, and limes. ? Tomato-based foods, such as red sauce, chili, salsa, and pizza with red sauce. ? Fried and fatty foods, such as donuts, french fries, potato chips, and high-fat dressings. ? High-fat meats, such as hot dogs and  fatty cuts of red and white meats, such as rib eye steak, sausage, ham, and bacon. ? High-fat dairy items, such as whole milk, butter, and cream cheese.  Eat small, frequent meals instead of large meals.  Avoid drinking large amounts of liquid with your meals.  Avoid eating meals during the 2-3 hours before bedtime.  Avoid lying down right after you eat.  Do not exercise right after you eat. Lifestyle   Do not use any products that contain nicotine or tobacco, such as cigarettes, e-cigarettes, and chewing tobacco. If you need help quitting, ask your health care provider.  Try to reduce your stress by using methods such as yoga or meditation. If you need help reducing stress, ask your health care provider.  If you are overweight, reduce your weight to an amount that is healthy for you. Ask your health care provider for guidance about a safe weight loss goal. General instructions  Pay attention to any changes in your symptoms.  Take over-the-counter and prescription medicines only as told by your health care provider. Do not take aspirin, ibuprofen, or other NSAIDs unless your health care provider told you to do so.  Wear loose-fitting clothing. Do not wear anything tight around your waist that causes pressure on your abdomen.  Raise (elevate) the head of your bed about 6 inches (15 cm).  Avoid bending over if this makes your symptoms worse.  Keep all follow-up visits as told by your health care provider. This is important. Contact a health care provider if:  You have: ? New symptoms. ? Unexplained weight loss. ? Difficulty swallowing or it hurts to swallow. ? Wheezing or a persistent cough. ? A hoarse voice.  Your symptoms do not improve with treatment. Get help right away if you:  Have pain in your arms, neck, jaw, teeth, or back.  Feel sweaty, dizzy, or light-headed.  Have chest pain or shortness of breath.  Vomit and your vomit looks like blood or coffee grounds.   Faint.  Have stool that is bloody or black.  Cannot swallow, drink, or eat. Summary  Gastroesophageal reflux happens when acid from the stomach flows up into the esophagus. GERD is a disease in which the reflux happens often, causes frequent or severe symptoms, or causes problems such as damage to the esophagus.  Treatment for this condition may vary depending on how severe your symptoms are. Your health care provider may recommend diet and lifestyle changes, medicine, or surgery.  Contact a health care provider if you have new or worsening symptoms.  Take over-the-counter and prescription medicines only as told by your health care provider. Do not take aspirin, ibuprofen, or other NSAIDs unless your health care provider told you to do so.  Keep all follow-up visits as told by your health care provider. This is important. This information is not intended to replace advice given to you by your health care provider. Make sure you discuss any questions you have with your health care provider. Document Released: 03/23/2005 Document Revised: 12/20/2017 Document Reviewed: 12/20/2017 Elsevier Patient Education  2020 [place] for Gastroesophageal Reflux Disease, Adult When you have gastroesophageal reflux disease (GERD), the foods you eat and your eating habits are very important. Choosing  the right foods can help ease the discomfort of GERD. Consider working with a diet and nutrition specialist (dietitian) to help you make healthy food choices. What general guidelines should I follow?  Eating plan  Choose healthy foods low in fat, such as fruits, vegetables, whole grains, low-fat dairy products, and lean meat, fish, and poultry.  Eat frequent, small meals instead of three large meals each day. Eat your meals slowly, in a relaxed setting. Avoid bending over or lying down until 2-3 hours after eating.  Limit high-fat foods such as fatty meats or fried foods.  Limit your  intake of oils, butter, and shortening to less than 8 teaspoons each day.  Avoid the following: ? Foods that cause symptoms. These may be different for different people. Keep a food diary to keep track of foods that cause symptoms. ? Alcohol. ? Drinking large amounts of liquid with meals. ? Eating meals during the 2-3 hours before bed.  Cook foods using methods other than frying. This may include baking, grilling, or broiling. Lifestyle  Maintain a healthy weight. Ask your health care provider what weight is healthy for you. If you need to lose weight, work with your health care provider to do so safely.  Exercise for at least 30 minutes on 5 or more days each week, or as told by your health care provider.  Avoid wearing clothes that fit tightly around your waist and chest.  Do not use any products that contain nicotine or tobacco, such as cigarettes and e-cigarettes. If you need help quitting, ask your health care provider.  Sleep with the head of your bed raised. Use a wedge under the mattress or blocks under the bed frame to raise the head of the bed. What foods are not recommended? The items listed may not be a complete list. Talk with your dietitian about what dietary choices are best for you. Grains Pastries or quick breads with added fat. [place] toast. Vegetables Deep fried vegetables. [place] fries. Any vegetables prepared with added fat. Any vegetables that cause symptoms. For some people this may include tomatoes and tomato products, chili peppers, onions and garlic, and horseradish. Fruits Any fruits prepared with added fat. Any fruits that cause symptoms. For some people this may include citrus fruits, such as oranges, grapefruit, pineapple, and lemons. Meats and other protein foods High-fat meats, such as fatty beef or pork, hot dogs, ribs, ham, sausage, salami and bacon. Fried meat or protein, including fried fish and fried chicken. Nuts and nut butters. Dairy Whole milk  and chocolate milk. Sour cream. Cream. Ice cream. Cream cheese. Milk shakes. Beverages Coffee and tea, with or without caffeine. Carbonated beverages. Sodas. Energy drinks. Fruit juice made with acidic fruits (such as orange or grapefruit). Tomato juice. Alcoholic drinks. Fats and oils Butter. Margarine. Shortening. Ghee. Sweets and desserts Chocolate and cocoa. Donuts. Seasoning and other foods Pepper. Peppermint and spearmint. Any condiments, herbs, or seasonings that cause symptoms. For some people, this may include curry, hot sauce, or vinegar-based salad dressings. Summary  When you have gastroesophageal reflux disease (GERD), food and lifestyle choices are very important to help ease the discomfort of GERD.  Eat frequent, small meals instead of three large meals each day. Eat your meals slowly, in a relaxed setting. Avoid bending over or lying down until 2-3 hours after eating.  Limit high-fat foods such as fatty meat or fried foods. This information is not intended to replace advice given to you by your health care provider. Make  health care provider. Make sure you discuss any questions you have with your health care provider. Document Released: 06/13/2005 Document Revised: 10/04/2018 Document Reviewed: 06/14/2016 Elsevier Patient Education  2020 [employer]  

## 2019-02-22 NOTE — Progress Notes (Signed)
Virtual Visit via Video Note  I connected with Sarah [name]  on 02/22/19 at  8:50 AM EDT by a video enabled telemedicine application and verified that I am speaking with the correct person using two identifiers.  Location patient: home Location provider:work or home office Persons participating in the virtual visit: patient, provider  I discussed the limitations of evaluation and management by telemedicine and the availability of in person appointments. The patient expressed understanding and agreed to proceed.   HPI: 1. Annual  2. C/o worsening GERD off zantac which took for years and tried omeprazole x 2 week periods and not working otc  3. C/o insomnia tried melatonin 10 mg and not working     ROS: See pertinent positives and negatives per HPI. General: no wt change  HEENT: no sore throat  CV: no chest pain  Lungs: no sob  GI : +GERD  GU: no issues  MSK: no jt pain today  Neuro: no h/a  Skin: no issues Psych: +insomnia    Past Medical History:  Diagnosis Date  . Allergy   . Chicken pox   . Chronic pain   . GERD (gastroesophageal reflux disease)   . History of kidney stones   . Hyperlipidemia   . Shingles     Past Surgical History:  Procedure Laterality Date  . ABDOMINAL HYSTERECTOMY  01/2001  . CESAREAN SECTION  12/1987  . CESAREAN SECTION  05/1993  . COLONOSCOPY WITH PROPOFOL N/A 01/29/2016   Procedure: COLONOSCOPY WITH PROPOFOL;  Surgeon: Manya Silvas, MD;  Location: [facility] ENDOSCOPY;  Service: Endoscopy;  Laterality: N/A;    Family History  Problem Relation Age of Onset  . Breast cancer Mother 49  . Cancer Mother        breast  . Colon cancer Father   . Lung cancer Father   . Diabetes Father   . Hypertension Paternal Grandfather   . Breast cancer Maternal Grandmother        30's  . Cancer Maternal Grandmother        breast   . Cancer Cousin        breast     SOCIAL HX: married  Husband has stage IV esophageal cancer recently dx'ed [facility]      Current Outpatient Medications:  .  b complex vitamins tablet, Take 1 tablet by mouth daily., Disp: , Rfl:  .  Cholecalciferol (VITAMIN D3) 2000 units CHEW, Chew by mouth daily., Disp: , Rfl:  .  loratadine (CLARITIN) 10 MG tablet, Take 10 mg by mouth daily., Disp: , Rfl:  .  magnesium oxide (MAG-OX) 400 MG tablet, Take 400 mg by mouth daily., Disp: , Rfl:  .  pantoprazole (PROTONIX) 40 MG tablet, Take 1 tablet (40 mg total) by mouth daily. 30 minutes before, Disp: 90 tablet, Rfl: 3 .  traMADol (ULTRAM) 50 MG tablet, Take 1 tablet (50 mg total) by mouth every 6 (six) hours as needed for up to 30 days for severe pain., Disp: 120 tablet, Rfl: 0  EXAM:  VITALS per patient if applicable:  GENERAL: alert, oriented, appears well and in no acute distress  HEENT: atraumatic, conjunttiva clear, no obvious abnormalities on inspection of external nose and ears  NECK: normal movements of the head and neck  LUNGS: on inspection no signs of respiratory distress, breathing rate appears normal, no obvious gross SOB, gasping or wheezing  CV: no obvious cyanosis  MS: moves all visible extremities without noticeable abnormality  PSYCH/NEURO: pleasant and  cooperative, no obvious depression or anxiety, speech and thought processing grossly intact  ASSESSMENT AND PLAN:  Discussed the following assessment and plan:  Annual physical exam -  Flu shot likely will get at work  Tdap had 12/07/11 Consider shingrix in future  Declines vit D, STD testing   Pap had 12/09/16 neg pap neg HPV Referral mammo breast exam normal FH breast cancer  Referred dermatology but pt never saw  Disc healthy diet choices, exercise and sunscreen  rec healthy diet and exercise  Colonoscopy pt wants to wait for now and will call back given husbands dx consider [facility] in future ask pt where she wants to go also for GERD  Gastroesophageal reflux disease without esophagitis - Plan: pantoprazole (PROTONIX) 40 MG  tablet   Insomnia -disc L theanine or tranquil sleep stress relax brand otc   Of note husband dx'ed with stage 4 esophageal cancer     I discussed the assessment and treatment plan with the patient. The patient was provided an opportunity to ask questions and all were answered. The patient agreed with the plan and demonstrated an understanding of the instructions.   The patient was advised to call back or seek an in-person evaluation if the symptoms worsen or if the condition fails to improve as anticipated.  Time spent 15 minutes  Delorise Jackson, MD

## 2019-02-25 ENCOUNTER — Other Ambulatory Visit
Admission: RE | Admit: 2019-02-25 | Discharge: 2019-02-25 | Disposition: A | Discharge status: Home / Self Care | Payer: No Typology Code available for payment source | Source: Ambulatory Visit | Attending: Internal Medicine | Admitting: Internal Medicine

## 2019-02-25 ENCOUNTER — Other Ambulatory Visit: Payer: Self-pay | Admitting: Internal Medicine

## 2019-02-25 ENCOUNTER — Other Ambulatory Visit: Payer: Self-pay

## 2019-02-25 DIAGNOSIS — E559 Vitamin D deficiency, unspecified: Secondary | ICD-10-CM | POA: Diagnosis present

## 2019-02-25 DIAGNOSIS — Z1389 Encounter for screening for other disorder: Secondary | ICD-10-CM | POA: Diagnosis present

## 2019-02-25 DIAGNOSIS — Z13220 Encounter for screening for lipoid disorders: Secondary | ICD-10-CM | POA: Diagnosis present

## 2019-02-25 DIAGNOSIS — Z1329 Encounter for screening for other suspected endocrine disorder: Secondary | ICD-10-CM | POA: Diagnosis present

## 2019-02-25 DIAGNOSIS — K219 Gastro-esophageal reflux disease without esophagitis: Secondary | ICD-10-CM

## 2019-02-25 DIAGNOSIS — R1013 Epigastric pain: Secondary | ICD-10-CM

## 2019-02-25 DIAGNOSIS — Z0000 Encounter for general adult medical examination without abnormal findings: Secondary | ICD-10-CM | POA: Diagnosis present

## 2019-02-25 LAB — COMPREHENSIVE METABOLIC PANEL
ALT: 24 U/L (ref 0–44)
AST: 20 U/L (ref 15–41)
Albumin: 3.9 g/dL (ref 3.5–5.0)
Alkaline Phosphatase: 76 U/L (ref 38–126)
Anion gap: 9 (ref 5–15)
BUN: 16 mg/dL (ref 6–20)
CO2: 27 mmol/L (ref 22–32)
Calcium: 9.1 mg/dL (ref 8.9–10.3)
Chloride: 107 mmol/L (ref 98–111)
Creatinine, Ser: 0.7 mg/dL (ref 0.44–1.00)
GFR calc Af Amer: >60 mL/min (ref >60)
GFR calc non Af Amer: >60 mL/min (ref >60)
Glucose, Bld: 107 mg/dL — ABNORMAL HIGH (ref 70–99)
Potassium: 4.1 mmol/L (ref 3.5–5.1)
Sodium: 143 mmol/L (ref 135–145)
Total Bilirubin: 0.6 mg/dL (ref 0.3–1.2)
Total Protein: 6.9 g/dL (ref 6.5–8.1)

## 2019-02-25 LAB — CBC WITH DIFFERENTIAL/PLATELET
Abs Immature Granulocytes: 0.05 10*3/uL (ref 0.00–0.07)
Basophils Absolute: 0 10*3/uL (ref 0.0–0.1)
Basophils Relative: 1 %
Eosinophils Absolute: 0.2 10*3/uL (ref 0.0–0.5)
Eosinophils Relative: 3 %
HCT: 40 % (ref 36.0–46.0)
Hemoglobin: 13.2 g/dL (ref 12.0–15.0)
Immature Granulocytes: 1 %
Lymphocytes Relative: 37 %
Lymphs Abs: 3 10*3/uL (ref 0.7–4.0)
MCH: 29.4 pg (ref 26.0–34.0)
MCHC: 33 g/dL (ref 30.0–36.0)
MCV: 89.1 fL (ref 80.0–100.0)
Monocytes Absolute: 0.5 10*3/uL (ref 0.1–1.0)
Monocytes Relative: 6 %
Neutro Abs: 4.4 10*3/uL (ref 1.7–7.7)
Neutrophils Relative %: 52 %
Platelets: 264 10*3/uL (ref 150–400)
RBC: 4.49 MIL/uL (ref 3.87–5.11)
RDW: 13.9 % (ref 11.5–15.5)
WBC: 8.1 10*3/uL (ref 4.0–10.5)
nRBC: 0 % (ref 0.0–0.2)

## 2019-02-25 LAB — URINALYSIS, ROUTINE W REFLEX MICROSCOPIC
Bilirubin Urine: NEGATIVE
Glucose, UA: NEGATIVE mg/dL
Hgb urine dipstick: NEGATIVE
Ketones, ur: NEGATIVE mg/dL
Leukocytes,Ua: NEGATIVE
Nitrite: NEGATIVE
Protein, ur: NEGATIVE mg/dL
Specific Gravity, Urine: 1.019 (ref 1.005–1.030)
pH: 5 (ref 5.0–8.0)

## 2019-02-25 LAB — TSH: TSH: 2.532 u[IU]/mL (ref 0.350–4.500)

## 2019-02-25 LAB — LIPID PANEL
Cholesterol: 180 mg/dL (ref 0–200)
HDL: 64 mg/dL (ref >40)
LDL Cholesterol: 100 mg/dL — ABNORMAL HIGH (ref 0–99)
Total CHOL/HDL Ratio: 2.8 RATIO
Triglycerides: 79 mg/dL (ref <150)
VLDL: 16 mg/dL (ref 0–40)

## 2019-02-26 LAB — VITAMIN D 25 HYDROXY (VIT D DEFICIENCY, FRACTURES): Vit D, 25-Hydroxy: 34.2 ng/mL (ref 30.0–100.0)

## 2019-03-01 ENCOUNTER — Encounter: Payer: Self-pay | Admitting: Internal Medicine

## 2019-03-05 ENCOUNTER — Other Ambulatory Visit: Payer: Self-pay | Admitting: Internal Medicine

## 2019-03-05 DIAGNOSIS — Z20828 Contact with and (suspected) exposure to other viral communicable diseases: Secondary | ICD-10-CM

## 2019-03-05 DIAGNOSIS — Z20822 Contact with and (suspected) exposure to covid-19: Secondary | ICD-10-CM

## 2019-03-06 ENCOUNTER — Encounter (INDEPENDENT_AMBULATORY_CARE_PROVIDER_SITE_OTHER): Payer: Self-pay

## 2019-03-07 ENCOUNTER — Other Ambulatory Visit (INDEPENDENT_AMBULATORY_CARE_PROVIDER_SITE_OTHER): Payer: No Typology Code available for payment source

## 2019-03-07 ENCOUNTER — Encounter (INDEPENDENT_AMBULATORY_CARE_PROVIDER_SITE_OTHER): Payer: Self-pay

## 2019-03-07 ENCOUNTER — Other Ambulatory Visit: Payer: Self-pay

## 2019-03-07 DIAGNOSIS — K219 Gastro-esophageal reflux disease without esophagitis: Secondary | ICD-10-CM

## 2019-03-07 DIAGNOSIS — R1013 Epigastric pain: Secondary | ICD-10-CM | POA: Diagnosis not present

## 2019-03-07 NOTE — Addendum Note (Signed)
Addended by: Leeanne Rio on: 03/07/2019 01:42 PM   Modules accepted: Orders

## 2019-03-08 ENCOUNTER — Encounter (INDEPENDENT_AMBULATORY_CARE_PROVIDER_SITE_OTHER): Payer: Self-pay

## 2019-03-09 ENCOUNTER — Encounter (INDEPENDENT_AMBULATORY_CARE_PROVIDER_SITE_OTHER): Payer: Self-pay

## 2019-03-10 ENCOUNTER — Encounter (INDEPENDENT_AMBULATORY_CARE_PROVIDER_SITE_OTHER): Payer: Self-pay

## 2019-03-11 ENCOUNTER — Encounter (INDEPENDENT_AMBULATORY_CARE_PROVIDER_SITE_OTHER): Payer: Self-pay

## 2019-03-11 LAB — H PYLORI, IGM, IGG, IGA AB
H pylori, IgM Abs: <9 units (ref 0.0–8.9)
H. pylori, IgA Abs: <9 units (ref 0.0–8.9)
H. pylori, IgG AbS: 0.1 Index Value (ref 0.00–0.79)

## 2019-03-11 LAB — SPECIMEN STATUS REPORT

## 2019-03-12 ENCOUNTER — Encounter (INDEPENDENT_AMBULATORY_CARE_PROVIDER_SITE_OTHER): Payer: Self-pay

## 2019-03-22 ENCOUNTER — Telehealth: Payer: No Typology Code available for payment source | Admitting: Family

## 2019-03-22 DIAGNOSIS — L239 Allergic contact dermatitis, unspecified cause: Secondary | ICD-10-CM | POA: Diagnosis not present

## 2019-03-22 MED ORDER — PREDNISONE 10 MG (21) PO TBPK: ORAL_TABLET | ORAL | 0 refills | Status: DC

## 2019-03-22 MED ORDER — TRIAMCINOLONE ACETONIDE 0.5 % EX OINT: 1.0000 "application " | TOPICAL_OINTMENT | Freq: Two times a day (BID) | CUTANEOUS | 0 refills | Status: DC

## 2019-03-22 NOTE — Progress Notes (Signed)
E Visit for Rash  We are sorry that you are not feeling well. Here is how we plan to help!  Based on what you shared with me it looks like you have contact dermatitis.  Contact dermatitis is a skin rash caused by something that touches the skin and causes irritation or inflammation.  Your skin may be red, swollen, dry, cracked, and itch.  The rash should go away in a few days but can last a few weeks.  If you get a rash, it's important to figure out what caused it so the irritant can be avoided in the future.  I have sent in a prescription of steroid cream, Kenalog 0.5 % cream twice a day. Avoid applying to your face or groin.  I have also sent in Prednisone 10 mg daily for 6 days (see taper instructions below)  Directions for 6 day taper: Day 1: 2 tablets before breakfast, 1 after both lunch & dinner and 2 at bedtime Day 2: 1 tab before breakfast, 1 after both lunch & dinner and 2 at bedtime Day 3: 1 tab at each meal & 1 at bedtime Day 4: 1 tab at breakfast, 1 at lunch, 1 at bedtime Day 5: 1 tab at breakfast & 1 tab at bedtime Day 6: 1 tab at breakfast   Approximately 5 minutes was spent documenting and reviewing patient's chart.    HOME CARE:   Take cool showers and avoid direct sunlight.  Apply cool compress or wet dressings.  Take a bath in an oatmeal bath.  Sprinkle content of one Aveeno packet under running faucet with comfortably warm water.  Bathe for 15-20 minutes, 1-2 times daily.  Pat dry with a towel. Do not rub the rash.  Use hydrocortisone cream.  Take an antihistamine like Benadryl for widespread rashes that itch.  The adult dose of Benadryl is 25-50 mg by mouth 4 times daily.  Caution:  This type of medication may cause sleepiness.  Do not drink alcohol, drive, or operate dangerous machinery while taking antihistamines.  Do not take these medications if you have prostate enlargement.  Read package instructions thoroughly on all medications that you take.  GET HELP  RIGHT AWAY IF:   Symptoms don't go away after treatment.  Severe itching that persists.  If you rash spreads or swells.  If you rash begins to smell.  If it blisters and opens or develops a yellow-brown crust.  You develop a fever.  You have a sore throat.  You become short of breath.  MAKE SURE YOU:  Understand these instructions. Will watch your condition. Will get help right away if you are not doing well or get worse.  Thank you for choosing an e-visit. Your e-visit answers were reviewed by a board certified advanced clinical practitioner to complete your personal care plan. Depending upon the condition, your plan could have included both over the counter or prescription medications. Please review your pharmacy choice. Be sure that the pharmacy you have chosen is open so that you can pick up your prescription now.  If there is a problem you may message your provider in [place] to have the prescription routed to another pharmacy. Your safety is important to [place]. If you have drug allergies check your prescription carefully.  For the next 24 hours, you can use MyChart to ask questions about today's visit, request a non-urgent call back, or ask for a work or school excuse from your e-visit provider. You will get an email in  the next two days asking about your experience. I hope that your e-visit has been valuable and will speed your recovery.

## 2019-03-28 ENCOUNTER — Encounter: Payer: Self-pay | Admitting: Internal Medicine

## 2019-04-30 ENCOUNTER — Other Ambulatory Visit: Payer: Self-pay | Admitting: Pain Medicine

## 2019-04-30 DIAGNOSIS — M7918 Myalgia, other site: Secondary | ICD-10-CM

## 2019-04-30 DIAGNOSIS — G894 Chronic pain syndrome: Secondary | ICD-10-CM

## 2019-04-30 DIAGNOSIS — M542 Cervicalgia: Secondary | ICD-10-CM

## 2019-04-30 MED ORDER — TRAMADOL HCL 50 MG PO TABS: 50.0000 mg | ORAL_TABLET | Freq: Four times a day (QID) | ORAL | 0 refills | Status: DC | PRN

## 2019-04-30 MED ORDER — CYCLOBENZAPRINE HCL 10 MG PO TABS
10.0000 mg | ORAL_TABLET | Freq: Three times a day (TID) | ORAL | 0 refills | Status: DC | PRN
Start: 2019-04-30 — End: 2020-01-08

## 2019-05-06 ENCOUNTER — Ambulatory Visit
Admission: RE | Admit: 2019-05-06 | Discharge: 2019-05-06 | Disposition: A | Discharge status: Home / Self Care | Payer: No Typology Code available for payment source | Source: Ambulatory Visit | Attending: Internal Medicine | Admitting: Internal Medicine

## 2019-05-06 DIAGNOSIS — Z1231 Encounter for screening mammogram for malignant neoplasm of breast: Secondary | ICD-10-CM | POA: Diagnosis present

## 2019-06-10 ENCOUNTER — Encounter: Payer: Self-pay | Admitting: Internal Medicine

## 2019-06-13 ENCOUNTER — Ambulatory Visit: Payer: No Typology Code available for payment source | Admitting: Internal Medicine

## 2019-06-14 ENCOUNTER — Encounter: Payer: Self-pay | Admitting: Internal Medicine

## 2019-06-14 ENCOUNTER — Other Ambulatory Visit: Payer: Self-pay

## 2019-06-14 ENCOUNTER — Ambulatory Visit (INDEPENDENT_AMBULATORY_CARE_PROVIDER_SITE_OTHER): Payer: No Typology Code available for payment source | Admitting: Internal Medicine

## 2019-06-14 VITALS — BP 112/72 | Ht 62.0 in | Wt 240.0 lb

## 2019-06-14 DIAGNOSIS — M5136 Other intervertebral disc degeneration, lumbar region: Secondary | ICD-10-CM

## 2019-06-14 DIAGNOSIS — G4700 Insomnia, unspecified: Secondary | ICD-10-CM

## 2019-06-14 DIAGNOSIS — M5416 Radiculopathy, lumbar region: Secondary | ICD-10-CM | POA: Insufficient documentation

## 2019-06-14 DIAGNOSIS — F419 Anxiety disorder, unspecified: Secondary | ICD-10-CM

## 2019-06-14 DIAGNOSIS — M479 Spondylosis, unspecified: Secondary | ICD-10-CM

## 2019-06-14 HISTORY — DX: Insomnia, unspecified: G47.00

## 2019-06-14 HISTORY — DX: Radiculopathy, lumbar region: M54.16

## 2019-06-14 MED ORDER — DIAZEPAM 5 MG PO TABS: 5.0000 mg | ORAL_TABLET | Freq: Once | ORAL | 0 refills | Status: DC | PRN

## 2019-06-14 NOTE — Progress Notes (Signed)
Virtual Visit via Video Note  I connected with Sarah [name]  on 06/14/19 at  1:40 PM EST by a video enabled telemedicine application and verified that I am speaking with the correct person using two identifiers.  Location patient: home Location provider:work or home office Persons participating in the virtual visit: patient, provider  I discussed the limitations of evaluation and management by telemedicine and the availability of in person appointments. The patient expressed understanding and agreed to proceed.   HPI: 1 chronic back pain with flare LLB x 2 weeks was 6-7/10 now 4-5/10 tried Ibuprofen, tramadol, flexeril at night which helps pain radiates down her buttocks on the left down her left leg  reviewed prior Xray with lumbar spondylosis/DDD 2. Insomnia melatonin 10 mg qhs is not helping disc Tranquil sleep otc today to try declines Rx meds for sleep   ROS: See pertinent positives and negatives per HPI.  Past Medical History:  Diagnosis Date  . Allergy   . Chicken pox   . Chronic pain   . GERD (gastroesophageal reflux disease)   . History of kidney stones   . Hyperlipidemia   . Shingles    age 48    Past Surgical History:  Procedure Laterality Date  . ABDOMINAL HYSTERECTOMY  01/2001  . CESAREAN SECTION  12/1987  . CESAREAN SECTION  05/1993  . COLONOSCOPY WITH PROPOFOL N/A 01/29/2016   Procedure: COLONOSCOPY WITH PROPOFOL;  Surgeon: Manya Silvas, MD;  Location: [facility] ENDOSCOPY;  Service: Endoscopy;  Laterality: N/A;    Family History  Problem Relation Age of Onset  . Breast cancer Mother 82  . Cancer Mother        breast  . Colon cancer Father   . Lung cancer Father   . Diabetes Father   . Hypertension Paternal Grandfather   . Breast cancer Maternal Grandmother        30's  . Cancer Maternal Grandmother        breast   . 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  b complex vitamins tablet, Take 1 tablet by mouth daily., Disp: , Rfl:  .  Cholecalciferol (VITAMIN D3) 2000 units CHEW, Chew by mouth daily., Disp: , Rfl:  .  cyclobenzaprine (FLEXERIL) 10 MG tablet, Take 1 tablet (10 mg total) by mouth 3 (three) times daily as needed for muscle spasms., Disp: 90 tablet, Rfl: 0 .  loratadine (CLARITIN) 10 MG tablet, Take 10 mg by mouth daily., Disp: , Rfl:  .  magnesium oxide (MAG-OX) 400 MG tablet, Take 400 mg by mouth daily., Disp: , Rfl:  .  pantoprazole (PROTONIX) 40 MG tablet, Take 1 tablet (40 mg total) by mouth daily. 30 minutes before, Disp: 90 tablet, Rfl: 3 .  traMADol (ULTRAM) 50 MG tablet, Take 1 tablet (50 mg total) by mouth every 6 (six) hours as needed for severe pain., Disp: 120 tablet, Rfl: 0  EXAM:  VITALS per patient if applicable:  GENERAL: alert, oriented, appears well and in no acute distress  HEENT: atraumatic, conjunttiva clear, no obvious abnormalities on inspection of external nose and ears  NECK: normal movements of the head and neck  LUNGS: on inspection no signs of respiratory distress, breathing rate appears normal,  no obvious gross SOB, gasping or wheezing  CV: no obvious cyanosis  MS: moves all visible extremities without noticeable abnormality  PSYCH/NEURO: pleasant and cooperative, no obvious depression or anxiety, speech and thought processing grossly intact  ASSESSMENT AND PLAN:  Discussed the following assessment and plan:  Spondylosis DDD (degenerative disc disease), lumbar Left lumbar radiculopathy -pt to call back with MRI L spine where she wants to go  Prn Valium prior to MRI  Refer to pain clinic for f/u consider injections MRI 1st  Insomnia, unspecified type -disc stress relax Tranquil sleep  HM Flu shot utd  Tdap had 12/07/11 Consider shingrix in future  Declines vit D, STD testing   Pap  had 12/09/16 neg pap neg HPV mammo 05/06/2019 negative  Referred dermatology but pt never saw as of 06/14/19 when ready refer [address] Disc healthy diet choices, exercise and sunscreen  rec healthy diet and exercise  Colonoscopy saw 01/29/16 Dr. Tiffany Kocher tubular adenoma + and FH dad colon cancer due 01/28/21   -we discussed possible serious and likely etiologies, options for evaluation and workup, limitations of telemedicine visit vs in person visit, treatment, treatment risks and precautions. Pt prefers to treat via telemedicine empirically rather then risking or undertaking an in person visit at this moment. Patient agrees to seek prompt in person care if worsening, new symptoms arise, or if is not improving with treatment.   I discussed the assessment and treatment plan with the patient. The patient was provided an opportunity to ask questions and all were answered. The patient agreed with the plan and demonstrated an understanding of the instructions.   The patient was advised to call back or seek an in-person evaluation if the symptoms worsen or if the condition fails to improve as anticipated.  Time spent 25 minutes  Delorise Jackson, MD

## 2019-06-14 NOTE — Patient Instructions (Addendum)
Stress relax brand -->Tranquil sleep [employer] or [employer]  Let me know about dermatology and MRI lumbar   Insomnia Insomnia is a sleep disorder that makes it difficult to fall asleep or stay asleep. Insomnia can cause fatigue, low energy, difficulty concentrating, mood swings, and poor performance at work or school. There are three different ways to classify insomnia:  Difficulty falling asleep.  Difficulty staying asleep.  Waking up too early in the morning. Any type of insomnia can be long-term (chronic) or short-term (acute). Both are common. Short-term insomnia usually lasts for three months or less. Chronic insomnia occurs at least three times a week for longer than three months. What are the causes? Insomnia may be caused by another condition, situation, or substance, such as:  Anxiety.  Certain medicines.  Gastroesophageal reflux disease (GERD) or other gastrointestinal conditions.  Asthma or other breathing conditions.  Restless legs syndrome, sleep apnea, or other sleep disorders.  Chronic pain.  Menopause.  Stroke.  Abuse of alcohol, tobacco, or illegal drugs.  Mental health conditions, such as depression.  Caffeine.  Neurological disorders, such as Alzheimer's disease.  An overactive thyroid (hyperthyroidism). Sometimes, the cause of insomnia may not be known. What increases the risk? Risk factors for insomnia include:  Gender. Women are affected more often than men.  Age. Insomnia is more common as you get older.  Stress.  Lack of exercise.  Irregular work schedule or working night shifts.  Traveling between different time zones.  Certain medical and mental health conditions. What are the signs or symptoms? If you have insomnia, the main symptom is having trouble falling asleep or having trouble staying asleep. This may lead to other symptoms, such as:  Feeling fatigued or having low energy.  Feeling nervous about going to sleep.  Not  feeling rested in the morning.  Having trouble concentrating.  Feeling irritable, anxious, or depressed. How is this diagnosed? This condition may be diagnosed based on:  Your symptoms and medical history. Your health care provider may ask about: ? Your sleep habits. ? Any medical conditions you have. ? Your mental health.  A physical exam. How is this treated? Treatment for insomnia depends on the cause. Treatment may focus on treating an underlying condition that is causing insomnia. Treatment may also include:  Medicines to help you sleep.  Counseling or therapy.  Lifestyle adjustments to help you sleep better. Follow these instructions at home: Eating and drinking   Limit or avoid alcohol, caffeinated beverages, and cigarettes, especially close to bedtime. These can disrupt your sleep.  Do not eat a large meal or eat spicy foods right before bedtime. This can lead to digestive discomfort that can make it hard for you to sleep. Sleep habits   Keep a sleep diary to help you and your health care provider figure out what could be causing your insomnia. Write down: ? When you sleep. ? When you wake up during the night. ? How well you sleep. ? How rested you feel the next day. ? Any side effects of medicines you are taking. ? What you eat and drink.  Make your bedroom a dark, comfortable place where it is easy to fall asleep. ? Put up shades or blackout curtains to block light from outside. ? Use a white noise machine to block noise. ? Keep the temperature cool.  Limit screen use before bedtime. This includes: ? Watching TV. ? Using your smartphone, tablet, or computer.  Stick to a routine that includes going to  bed and waking up at the same times every day and night. This can help you fall asleep faster. Consider making a quiet activity, such as reading, part of your nighttime routine.  Try to avoid taking naps during the day so that you sleep better at night.  Get  out of bed if you are still awake after 15 minutes of trying to sleep. Keep the lights down, but try reading or doing a quiet activity. When you feel sleepy, go back to bed. General instructions  Take over-the-counter and prescription medicines only as told by your health care provider.  Exercise regularly, as told by your health care provider. Avoid exercise starting several hours before bedtime.  Use relaxation techniques to manage stress. Ask your health care provider to suggest some techniques that may work well for you. These may include: ? Breathing exercises. ? Routines to release muscle tension. ? Visualizing peaceful scenes.  Make sure that you drive carefully. Avoid driving if you feel very sleepy.  Keep all follow-up visits as told by your health care provider. This is important. Contact a health care provider if:  You are tired throughout the day.  You have trouble in your daily routine due to sleepiness.  You continue to have sleep problems, or your sleep problems get worse. Get help right away if:  You have serious thoughts about hurting yourself or someone else. If you ever feel like you may hurt yourself or others, or have thoughts about taking your own life, get help right away. You can go to your nearest emergency department or call:  Your local emergency services (911 in the [place]  A suicide crisis helpline, such as the [facility] at 747 496 3870. This is open 24 hours a day. Summary  Insomnia is a sleep disorder that makes it difficult to fall asleep or stay asleep.  Insomnia can be long-term (chronic) or short-term (acute).  Treatment for insomnia depends on the cause. Treatment may focus on treating an underlying condition that is causing insomnia.  Keep a sleep diary to help you and your health care provider figure out what could be causing your insomnia. This information is not intended to replace advice given to you by your  health care provider. Make sure you discuss any questions you have with your health care provider. Document Released: 06/10/2000 Document Revised: 05/26/2017 Document Reviewed: 03/23/2017 Elsevier Patient Education  2020 [place].    Back Exercises The following exercises strengthen the muscles that help to support the trunk and back. They also help to keep the lower back flexible. Doing these exercises can help to prevent back pain or lessen existing pain.  If you have back pain or discomfort, try doing these exercises 2-3 times each day or as told by your health care provider.  As your pain improves, do them once each day, but increase the number of times that you repeat the steps for each exercise (do more repetitions).  To prevent the recurrence of back pain, continue to do these exercises once each day or as told by your health care provider. Do exercises exactly as told by your health care provider and adjust them as directed. It is normal to feel mild stretching, pulling, tightness, or discomfort as you do these exercises, but you should stop right away if you feel sudden pain or your pain gets worse. Exercises Single knee to chest Repeat these steps 3-5 times for each leg: 1. Lie on your back on a firm bed  or the floor with your legs extended. 2. Bring one knee to your chest. Your other leg should stay extended and in contact with the floor. 3. Hold your knee in place by grabbing your knee or thigh with both hands and hold. 4. Pull on your knee until you feel a gentle stretch in your lower back or buttocks. 5. Hold the stretch for 10-30 seconds. 6. Slowly release and straighten your leg. Pelvic tilt Repeat these steps 5-10 times: 1. Lie on your back on a firm bed or the floor with your legs extended. 2. Bend your knees so they are pointing toward the ceiling and your feet are flat on the floor. 3. Tighten your lower abdominal muscles to press your lower back against the  floor. This motion will tilt your pelvis so your tailbone points up toward the ceiling instead of pointing to your feet or the floor. 4. With gentle tension and even breathing, hold this position for 5-10 seconds. Cat-cow Repeat these steps until your lower back becomes more flexible: 1. Get into a hands-and-knees position on a firm surface. Keep your hands under your shoulders, and keep your knees under your hips. You may place padding under your knees for comfort. 2. Let your head hang down toward your chest. Contract your abdominal muscles and point your tailbone toward the floor so your lower back becomes rounded like the back of a cat. 3. Hold this position for 5 seconds. 4. Slowly lift your head, let your abdominal muscles relax and point your tailbone up toward the ceiling so your back forms a sagging arch like the back of a cow. 5. Hold this position for 5 seconds.  Press-ups Repeat these steps 5-10 times: 1. Lie on your abdomen (face-down) on the floor. 2. Place your palms near your head, about shoulder-width apart. 3. Keeping your back as relaxed as possible and keeping your hips on the floor, slowly straighten your arms to raise the top half of your body and lift your shoulders. Do not use your back muscles to raise your upper torso. You may adjust the placement of your hands to make yourself more comfortable. 4. Hold this position for 5 seconds while you keep your back relaxed. 5. Slowly return to lying flat on the floor.  Bridges Repeat these steps 10 times: 1. Lie on your back on a firm surface. 2. Bend your knees so they are pointing toward the ceiling and your feet are flat on the floor. Your arms should be flat at your sides, next to your body. 3. Tighten your buttocks muscles and lift your buttocks off the floor until your waist is at almost the same height as your knees. You should feel the muscles working in your buttocks and the back of your thighs. If you do not feel these  muscles, slide your feet 1-2 inches farther away from your buttocks. 4. Hold this position for 3-5 seconds. 5. Slowly lower your hips to the starting position, and allow your buttocks muscles to relax completely. If this exercise is too easy, try doing it with your arms crossed over your chest. Abdominal crunches Repeat these steps 5-10 times: 1. Lie on your back on a firm bed or the floor with your legs extended. 2. Bend your knees so they are pointing toward the ceiling and your feet are flat on the floor. 3. Cross your arms over your chest. 4. Tip your chin slightly toward your chest without bending your neck. 5. Tighten your abdominal muscles  and slowly raise your trunk (torso) high enough to lift your shoulder blades a tiny bit off the floor. Avoid raising your torso higher than that because it can put too much stress on your low back and does not help to strengthen your abdominal muscles. 6. Slowly return to your starting position. Back lifts Repeat these steps 5-10 times: 1. Lie on your abdomen (face-down) with your arms at your sides, and rest your forehead on the floor. 2. Tighten the muscles in your legs and your buttocks. 3. Slowly lift your chest off the floor while you keep your hips pressed to the floor. Keep the back of your head in line with the curve in your back. Your eyes should be looking at the floor. 4. Hold this position for 3-5 seconds. 5. Slowly return to your starting position. Contact a health care provider if:  Your back pain or discomfort gets much worse when you do an exercise.  Your worsening back pain or discomfort does not lessen within 2 hours after you exercise. If you have any of these problems, stop doing these exercises right away. Do not do them again unless your health care provider says that you can. Get help right away if:  You develop sudden, severe back pain. If this happens, stop doing the exercises right away. Do not do them again unless your  health care provider says that you can. This information is not intended to replace advice given to you by your health care provider. Make sure you discuss any questions you have with your health care provider. Document Released: 07/21/2004 Document Revised: 10/18/2018 Document Reviewed: 03/15/2018 Elsevier Patient Education  2020 [employer].

## 2019-07-10 ENCOUNTER — Encounter: Payer: Self-pay | Admitting: Internal Medicine

## 2019-07-28 ENCOUNTER — Encounter: Payer: Self-pay | Admitting: Internal Medicine

## 2019-07-29 ENCOUNTER — Other Ambulatory Visit: Payer: Self-pay | Admitting: Internal Medicine

## 2019-07-29 DIAGNOSIS — R002 Palpitations: Secondary | ICD-10-CM

## 2019-07-29 DIAGNOSIS — R0683 Snoring: Secondary | ICD-10-CM

## 2019-08-20 ENCOUNTER — Ambulatory Visit: Payer: No Typology Code available for payment source | Admitting: Adult Health

## 2019-08-20 ENCOUNTER — Other Ambulatory Visit: Payer: Self-pay

## 2019-08-20 ENCOUNTER — Encounter: Payer: Self-pay | Admitting: Adult Health

## 2019-08-20 VITALS — BP 122/74 | HR 98 | Temp 97.8°F | Ht 62.0 in | Wt 245.8 lb

## 2019-08-20 DIAGNOSIS — G4719 Other hypersomnia: Secondary | ICD-10-CM | POA: Diagnosis not present

## 2019-08-20 DIAGNOSIS — G4700 Insomnia, unspecified: Secondary | ICD-10-CM

## 2019-08-20 DIAGNOSIS — R400 Somnolence: Secondary | ICD-10-CM

## 2019-08-20 NOTE — Patient Instructions (Addendum)
Set up for home sleep study.  Work on healthy weight  Healthy sleep regimen as discussed.  Cut back on caffeine.  Follow up in 3 months with Dr. Mortimer Fries and As needed

## 2019-08-20 NOTE — Assessment & Plan Note (Signed)
Patient education healthy sleep regimen

## 2019-08-20 NOTE — Progress Notes (Signed)
@Patient  ID: Sarah [name], female    DOB: [birth year], 49 y.o.   MRN: [id number]  Chief Complaint  Patient presents with  . sleep consult    Referring provider: McLean-Scocuzza, Olivia Mackie *  HPI: 49 year old female presents for sleep consult August 20, 2019 for insomnia, snoring, daytime sleepiness and restless sleep, referred by primary care provider Medical history significant for GERD, hyperlipidemia and degenerative disc disease  TEST/EVENTS :   08/20/2019 Sleep consult  Patient presents today for sleep consult.  Patient was referred by her primary care provider.  Patient has been having ongoing restless sleep, daytime sleepiness, loud snoring and insomnia.  She goes to bed between 9:51 PM gets up around 5:45 AM.  Says she wakes up multiple times throughout the night.  Takes her 30 minutes to 1 hour to fall asleep she has never had a sleep study in the past.  She is a former smoker.  Quit in 2018.  She currently works as a [employer] certified accountant.  Caffeine intake Feels very tired during the daytime.  Can fall asleep easily during the daytime. Epworth Score 8 Drinks 3 cups of coffee, 6-8 glasses of caffeine a day.  Take claritin and flonase for chronic nasal congestion   Has gained 100lbs over last 20 years. Hard to lose weight.  Watch tv prior to going bed.   Chronic back and hip ain , takes tramadol and flexeril rarely.   Using tranquil sleep otc , not helping with sleep.   Noticed abnormal sleep patten on fit bit.     Allergies  Allergen Reactions  . Aspirin     bleeding  . Sulfa Antibiotics Hives    Immunization History  Administered Date(s) Administered  . Influenza-Unspecified 04/08/2015, 04/03/2018, 04/09/2019  . Tdap 12/07/2011    Past Medical History:  Diagnosis Date  . Allergy   . Chicken pox   . Chronic pain   . GERD (gastroesophageal reflux disease)   . History of kidney stones   . Hyperlipidemia   . Shingles    age 68    Tobacco History: Social History     Tobacco Use  Smoking Status Former Research scientist (life sciences)  . Packs/day: 0.50  . Years: 10.00  . Pack years: 5.00  . Types: Cigarettes  . Quit date: 06/27/2010  . Years since quitting: 9.1  Smokeless Tobacco Never Used  Tobacco Comment   quit 2015---currently using e cig   Counseling given: Not Answered Comment: quit 2015---currently using e cig Family history positive for cancer and father mother and grandparents.  Outpatient Medications Prior to Visit  Medication Sig Dispense Refill  . b complex vitamins tablet Take 1 tablet by mouth daily.    . Cholecalciferol (VITAMIN D3) 2000 units CHEW Chew by mouth daily.    . cyclobenzaprine (FLEXERIL) 10 MG tablet Take 1 tablet (10 mg total) by mouth 3 (three) times daily as needed for muscle spasms. 90 tablet 0  . loratadine (CLARITIN) 10 MG tablet Take 10 mg by mouth daily.    . magnesium oxide (MAG-OX) 400 MG tablet Take 400 mg by mouth daily.    . pantoprazole (PROTONIX) 40 MG tablet Take 1 tablet (40 mg total) by mouth daily. 30 minutes before 90 tablet 3  . traMADol (ULTRAM) 50 MG tablet Take 1 tablet (50 mg total) by mouth every 6 (six) hours as needed for severe pain. 120 tablet 0  . diazepam (VALIUM) 5 MG tablet Take 1-2 tablets (5-10 mg total) by mouth once as needed  for up to 1 dose for anxiety. Before MRI (Patient not taking: Reported on 08/20/2019) 2 tablet 0   No facility-administered medications prior to visit.     Review of Systems:   Constitutional:   No  weight loss, night sweats,  Fevers, chills, fatigue, or  lassitude.  HEENT:   No headaches,  Difficulty swallowing,  Tooth/dental problems, or  Sore throat,                No sneezing, itching, ear ache, positive nasal congestion, post nasal drip,   CV:  No chest pain,  Orthopnea, PND, swelling in lower extremities, anasarca, dizziness,  syncope.  Positive palpitations  GI  No heartburn,  abdominal pain, nausea, vomiting, diarrhea, change in bowel habits, loss of appetite, bloody  stools.  Positive GERD Resp: No shortness of breath with exertion or at rest.  No excess mucus, no productive cough,  No non-productive cough,  No coughing up of blood.  No change in color of mucus.  No wheezing.  No chest wall deformity  Skin: no rash or lesions.  GU: no dysuria, change in color of urine, no urgency or frequency.  No flank pain, no hematuria   MS: Chronic back pain   Physical Exam  BP 122/74 (BP Location: Left Arm, Cuff Size: Normal)   Pulse 98   Temp 97.8 F (36.6 C) (Temporal)   Ht 5\' 2"  (1.575 m)   Wt 245 lb 12.8 oz (111.5 kg)   SpO2 99%   BMI 44.96 kg/m   GEN: A/Ox3; pleasant , NAD, BMI 44    HEENT:  [place]/AT,   NOSE-clear, THROAT-clear, no lesions, no postnasal drip or exudate noted. Class 3 MP airway   NECK:  Supple w/ fair ROM; no JVD; normal carotid impulses w/o bruits; no thyromegaly or nodules palpated; no lymphadenopathy.    RESP  Clear  P & A; w/o, wheezes/ rales/ or rhonchi. no accessory muscle use, no dullness to percussion  CARD:  RRR, no m/r/g, no peripheral edema, pulses intact, no cyanosis or clubbing.  GI:   Soft & nt; nml bowel sounds; no organomegaly or masses detected.   Musco: Warm bil, no deformities or joint swelling noted.   Neuro: alert, no focal deficits noted.    Skin: Warm, no lesions or rashes    Lab Results:  CBC  BNP No results found for: BNP  ProBNP No results found for: PROBNP  Imaging: No results found.    No flowsheet data found.  No results found for: NITRICOXIDE      Assessment & Plan:   Daytime sleepiness Daytime sleepiness, snoring , and restless sleep , high risk for sleep apnea  Plan  Patient Instructions  Set up for home sleep study.  Work on healthy weight  Healthy sleep regimen as discussed.  Cut back on caffeine.  Follow up in 3 months with Dr. Mortimer Fries and As needed         Insomnia Patient education healthy sleep regimen  Morbid obesity ([place]) Healthy weight loss  discussed    Rexene Edison, NP 08/20/2019

## 2019-08-20 NOTE — Assessment & Plan Note (Signed)
Daytime sleepiness, snoring , and restless sleep , high risk for sleep apnea  Plan  Patient Instructions  Set up for home sleep study.  Work on healthy weight  Healthy sleep regimen as discussed.  Cut back on caffeine.  Follow up in 3 months with Dr. Mortimer Fries and As needed

## 2019-08-20 NOTE — Assessment & Plan Note (Signed)
Healthy weight loss discussed 

## 2019-08-28 ENCOUNTER — Telehealth: Payer: No Typology Code available for payment source | Admitting: Family

## 2019-08-28 ENCOUNTER — Ambulatory Visit
Admission: EM | Admit: 2019-08-28 | Discharge: 2019-08-28 | Disposition: A | Discharge status: Home / Self Care | Payer: No Typology Code available for payment source | Attending: Emergency Medicine | Admitting: Emergency Medicine

## 2019-08-28 ENCOUNTER — Other Ambulatory Visit: Payer: Self-pay

## 2019-08-28 DIAGNOSIS — M546 Pain in thoracic spine: Secondary | ICD-10-CM | POA: Diagnosis not present

## 2019-08-28 DIAGNOSIS — R399 Unspecified symptoms and signs involving the genitourinary system: Secondary | ICD-10-CM

## 2019-08-28 DIAGNOSIS — R1030 Lower abdominal pain, unspecified: Secondary | ICD-10-CM | POA: Diagnosis not present

## 2019-08-28 DIAGNOSIS — R102 Pelvic and perineal pain: Secondary | ICD-10-CM | POA: Diagnosis not present

## 2019-08-28 DIAGNOSIS — R109 Unspecified abdominal pain: Secondary | ICD-10-CM

## 2019-08-28 LAB — POCT URINALYSIS DIP (MANUAL ENTRY)
Bilirubin, UA: NEGATIVE
Blood, UA: NEGATIVE
Glucose, UA: NEGATIVE mg/dL
Ketones, POC UA: NEGATIVE mg/dL
Leukocytes, UA: NEGATIVE
Nitrite, UA: NEGATIVE
Protein Ur, POC: NEGATIVE mg/dL
Spec Grav, UA: 1.025 (ref 1.010–1.025)
Urobilinogen, UA: 0.2 E.U./dL
pH, UA: 6.5 (ref 5.0–8.0)

## 2019-08-28 NOTE — ED Provider Notes (Signed)
Roderic Palau    CSN: [id number] Arrival date & time: 08/28/19  1403      History   Chief Complaint No chief complaint on file.   HPI Sarah [name] is a 49 y.o. female.  Patient presents with lower abdominal pain, right flank pain, and malodorous urine x 4 days.  She denies fever, chills, dysuria, hematuria, vaginal discharge, pelvic pain, or other symptoms.   No falls or injury.  She has been treating her symptoms at home with ibuprofen without relief.  Patient has history of chronic pain.    The history is provided by the patient.    Past Medical History:  Diagnosis Date  . Allergy   . Chicken pox   . Chronic pain   . GERD (gastroesophageal reflux disease)   . History of kidney stones   . Hyperlipidemia   . Shingles    age 49    Patient Active Problem List   Diagnosis Date Noted  . Daytime sleepiness 08/20/2019  . Left lumbar radiculopathy 06/14/2019  . DDD (degenerative disc disease), lumbar 06/14/2019  . Spondylosis 06/14/2019  . Insomnia 06/14/2019  . Chronic pain syndrome 10/08/2018  . Class 3 severe obesity due to excess calories without serious comorbidity with body mass index (BMI) of 40.0 to 44.9 in adult ([place]) 12/15/2017  . Hyperlipidemia 12/15/2017  . Annual physical exam 12/09/2016  . Chronic low back pain (Left) 12/07/2016  . Chronic neck pain (Left) 12/07/2016  . Cervical facet hypertrophy 12/07/2016  . Osteoarthritis of cervical spine 12/07/2016  . Cervical facet syndrome (HCC) (Left) 12/07/2016  . Cervical radicular pain (upper cervical) (Left) 12/07/2016  . Cervical radiculitis (Left) 12/07/2016  . Myofascial pain syndrome, cervical 12/07/2016  . Musculoskeletal pain 12/07/2016  . Neurogenic pain 12/07/2016  . Chronic hip pain (Left) 03/15/2016  . History of nephrolithiasis 01/26/2016  . Chronic knee pain (Left) 11/17/2015  . Morbid obesity ([place]) 09/20/2015  . GERD (gastroesophageal reflux disease) 09/20/2015    Past Surgical  History:  Procedure Laterality Date  . ABDOMINAL HYSTERECTOMY  01/2001  . CESAREAN SECTION  12/1987  . CESAREAN SECTION  05/1993  . COLONOSCOPY WITH PROPOFOL N/A 01/29/2016   Procedure: COLONOSCOPY WITH PROPOFOL;  Surgeon: Manya Silvas, MD;  Location[facility] ENDOSCOPY;  Service: Endoscopy;  Laterality: N/A;    OB History   No obstetric history on file.      Home Medications    Prior to Admission medications   Medication Sig Start Date End Date Taking? Authorizing Provider  b complex vitamins tablet Take 1 tablet by mouth daily.   Yes [provider]  Cholecalciferol (VITAMIN D3) 2000 units CHEW Chew by mouth daily.   Yes [provider]  cyclobenzaprine (FLEXERIL) 10 MG tablet Take 1 tablet (10 mg total) by mouth 3 (three) times daily as needed for muscle spasms. 04/30/19 10/27/19 Yes Milinda Pointer, MD  loratadine (CLARITIN) 10 MG tablet Take 10 mg by mouth daily.   Yes [provider]  magnesium oxide (MAG-OX) 400 MG tablet Take 400 mg by mouth daily.   Yes [provider]  pantoprazole (PROTONIX) 40 MG tablet Take 1 tablet (40 mg total) by mouth daily. 30 minutes before 02/22/19  Yes McLean-Scocuzza, Nino Glow, MD  traMADol (ULTRAM) 50 MG tablet Take 1 tablet (50 mg total) by mouth every 6 (six) hours as needed for severe pain. 04/30/19 08/28/19 Yes Milinda Pointer, MD    Family History Family History  Problem Relation Age of Onset  .  Breast cancer Mother 61  . Cancer Mother        breast  . Colon cancer Father   . Lung cancer Father   . Diabetes Father   . Hypertension Paternal Grandfather   . Breast cancer Maternal Grandmother        30's  . Cancer Maternal Grandmother        breast   . Cancer Cousin        breast     Social History Social History   Tobacco Use  . Smoking status: Former Smoker    Packs/day: 0.50    Years: 10.00    Pack years: 5.00    Types: Cigarettes    Quit date: 06/27/2010    Years since quitting: 9.1  .  Smokeless tobacco: Never Used  . Tobacco comment: quit 2015---currently using e cig  Substance Use Topics  . Alcohol use: Yes    Alcohol/week: 0.0 standard drinks    Comment: occasional  . Drug use: No     Allergies   Aspirin and Sulfa antibiotics   Review of Systems Review of Systems  Constitutional: Negative for chills and fever.  HENT: Negative for ear pain and sore throat.   Eyes: Negative for pain and visual disturbance.  Respiratory: Negative for cough and shortness of breath.   Cardiovascular: Negative for chest pain and palpitations.  Gastrointestinal: Positive for abdominal pain. Negative for diarrhea, nausea and vomiting.  Genitourinary: Negative for dysuria, hematuria, pelvic pain and vaginal discharge.  Musculoskeletal: Positive for back pain. Negative for arthralgias.  Skin: Negative for color change and rash.  Neurological: Negative for seizures and syncope.  All other systems reviewed and are negative.    Physical Exam Triage Vital Signs ED Triage Vitals  Enc Vitals Group     BP      Pulse      Resp      Temp      Temp src      SpO2      Weight      Height      Head Circumference      Peak Flow      Pain Score      Pain Loc      Pain Edu?      Excl. in [place]?    No data found.  Updated Vital Signs BP (!) 126/91 (BP Location: Left Arm)   Pulse (!) 105   Temp 98.5 F (36.9 C) (Oral)   Resp 18   Ht 5\' 2"  (1.575 m)   Wt 242 lb (109.8 kg)   SpO2 96%   BMI 44.26 kg/m   Visual Acuity Right Eye Distance:   Left Eye Distance:   Bilateral Distance:    Right Eye Near:   Left Eye Near:    Bilateral Near:     Physical Exam Vitals and nursing note reviewed.  Constitutional:      General: She is not in acute distress.    Appearance: She is well-developed. She is not ill-appearing.  HENT:     Head: Normocephalic and atraumatic.     Mouth/Throat:     Mouth: Mucous membranes are moist.     Pharynx: Oropharynx is clear.  Eyes:      Conjunctiva/sclera: Conjunctivae normal.  Cardiovascular:     Rate and Rhythm: Normal rate and regular rhythm.     Heart sounds: No murmur.  Pulmonary:     Effort: Pulmonary effort is normal. No respiratory distress.  Breath sounds: Normal breath sounds.  Abdominal:     General: Bowel sounds are normal.     Palpations: Abdomen is soft.     Tenderness: There is abdominal tenderness. There is no right CVA tenderness, left CVA tenderness, guarding or rebound.     Comments: Mild suprapubic tenderness.  No rebound or guarding.  No CVAT.    Musculoskeletal:        General: No swelling, tenderness or deformity. Normal range of motion.     Cervical back: Neck supple.     Comments: Back nontender to palpation.    Skin:    General: Skin is warm and dry.     Findings: No bruising, erythema, lesion or rash.  Neurological:     General: No focal deficit present.     Mental Status: She is alert and oriented to person, place, and time.     Gait: Gait normal.  Psychiatric:        Mood and Affect: Mood normal.        Behavior: Behavior normal.      UC Treatments / Results  Labs (all labs ordered are listed, but only abnormal results are displayed) Labs Reviewed  POCT URINALYSIS DIP (MANUAL ENTRY)    EKG   Radiology No results found.  Procedures Procedures (including critical care time)  Medications Ordered in UC Medications - No data to display  Initial Impression / Assessment and Plan / UC Course  I have reviewed the triage vital signs and the nursing notes.  Pertinent labs & imaging results that were available during my care of the patient were reviewed by me and considered in my medical decision making (see chart for details).   Suprapubic abdominal pain.  Right mid back pain.  Urine negative.  Instructed patient to take Tylenol or ibuprofen as needed for discomfort.  Instructed her to follow-up with her PCP if her symptoms are not improving.  Instructed her to go to the  emergency department if she has acute worsening symptoms, including abdominal pain or back pain.  Patient agrees to plan of care.     Final Clinical Impressions(s) / UC Diagnoses   Final diagnoses:  Acute right-sided thoracic back pain  Abdominal pain, suprapubic     Discharge Instructions     Your urine does not show signs of an infection.  Take Tylenol or ibuprofen as needed for pain.    Follow-up with your primary care provider if your symptoms are not improving.    Go to the emergency department if you have acute worsening symptoms, including abdominal pain or back pain.        ED Prescriptions    None     I have reviewed the PDMP during this encounter.   Sharion Balloon, NP 08/28/19 1436

## 2019-08-28 NOTE — Discharge Instructions (Addendum)
Your urine does not show signs of an infection.  Take Tylenol or ibuprofen as needed for pain.    Follow-up with your primary care provider if your symptoms are not improving.    Go to the emergency department if you have acute worsening symptoms, including abdominal pain or back pain.

## 2019-08-28 NOTE — ED Triage Notes (Signed)
Pt presents with c/o lower abdominal pain and right flank pain. Pt also reports strong odor to urine. Pt denies dysuria or hematuria. Pt also denies fever/chills, n/v/d.

## 2019-08-28 NOTE — Progress Notes (Signed)
Based on what you shared with me, I feel your condition warrants further evaluation and I recommend that you be seen for a face to face office visit.  Given your UTI symptoms with back pain you need to be seen face to face to be evaluated for a more serious infection.    NOTE: If you entered your credit card information for this eVisit, you will not be charged. You may see a "hold" on your card for the $35 but that hold will drop off and you will not have a charge processed.   If you are having a true medical emergency please call 911.      For an urgent face to face visit, [place] has five urgent care centers for your convenience:      NEW:  [facility] [place] [facility] [place] Get Driving Directions [id number] [place], [place] [address] . 10 am - 6pm Monday - Friday    [facility]) Get Driving Directions [id number] [address] [place], [place] [address] . 10 am to 8 pm Monday-Friday . 12 pm to 8 pm [facility] Get Driving Directions [id number] [place], [place], [place] [address] . 8 am to 8 pm Monday-Friday . 9 am to 6 pm Saturday . 11 am to 6 pm Sunday     [facility] Get Driving Directions  [id number] [address] [place], [place] [address] . 8 am to 8 pm Monday-Friday . 8 am to 4 pm [facility] [place] Get Driving Directions [id number] [place], [place] [address] . 12 pm to 6 pm Monday-Friday      Your e-visit answers were reviewed by a board certified advanced clinical practitioner to complete your personal care plan.  Thank you for using e-Visits.

## 2019-09-18 ENCOUNTER — Ambulatory Visit: Payer: No Typology Code available for payment source | Admitting: Internal Medicine

## 2019-09-29 ENCOUNTER — Encounter (INDEPENDENT_AMBULATORY_CARE_PROVIDER_SITE_OTHER): Payer: No Typology Code available for payment source | Admitting: Internal Medicine

## 2019-09-29 DIAGNOSIS — F419 Anxiety disorder, unspecified: Secondary | ICD-10-CM

## 2019-10-02 ENCOUNTER — Other Ambulatory Visit: Payer: Self-pay | Admitting: Internal Medicine

## 2019-10-02 DIAGNOSIS — F419 Anxiety disorder, unspecified: Secondary | ICD-10-CM

## 2019-10-02 MED ORDER — ALPRAZOLAM 0.25 MG PO TABS: 0.2500 mg | ORAL_TABLET | Freq: Every day | ORAL | 0 refills | Status: DC | PRN

## 2019-10-02 NOTE — Addendum Note (Signed)
Addended by: Orland Mustard on: 10/02/2019 06:45 PM   Modules accepted: Orders

## 2019-10-03 ENCOUNTER — Other Ambulatory Visit: Payer: Self-pay | Admitting: Internal Medicine

## 2019-10-03 DIAGNOSIS — F419 Anxiety disorder, unspecified: Secondary | ICD-10-CM

## 2019-10-03 MED ORDER — ALPRAZOLAM 0.25 MG PO TABS: 0.2500 mg | ORAL_TABLET | Freq: Every day | ORAL | 0 refills | Status: DC | PRN

## 2019-10-07 NOTE — Telephone Encounter (Signed)
Yes I am agreeable   Vachon, Lataya Sollars  You 7 days ago    Yes I agree to the charge.  Thanks   You  Badman, Raylei Relihan 7 days ago  TM Mrs. Teaster I have never prescribed Xanax for you  If you are agreeable to a my chart consult fee for this medication sent to your pharmacy I will Rx it for you -are you agreeable?   Also you may want to reach out to employee therapy/counseling as well    Thressa Sheller, CMA  You 7 days ago   Please advise , did not see this on patient's past med list   Routing comment   [place], Girl Lorio  You 8 days ago   A/P  Anxiety  Prn Xanax sent [place] to my chart fee  5-10 min [place]

## 2019-10-10 ENCOUNTER — Other Ambulatory Visit: Payer: Self-pay

## 2019-10-10 ENCOUNTER — Ambulatory Visit: Payer: No Typology Code available for payment source

## 2019-10-10 DIAGNOSIS — G4733 Obstructive sleep apnea (adult) (pediatric): Secondary | ICD-10-CM | POA: Diagnosis not present

## 2019-10-10 DIAGNOSIS — G4719 Other hypersomnia: Secondary | ICD-10-CM

## 2019-10-14 DIAGNOSIS — G4733 Obstructive sleep apnea (adult) (pediatric): Secondary | ICD-10-CM | POA: Diagnosis not present

## 2019-10-15 ENCOUNTER — Telehealth: Payer: Self-pay | Admitting: Adult Health

## 2019-10-15 DIAGNOSIS — G4733 Obstructive sleep apnea (adult) (pediatric): Secondary | ICD-10-CM

## 2019-10-15 NOTE — Telephone Encounter (Signed)
Home sleep study 10/10/19 AHI 19.3, SpO2 low 71%; moderate OSA.   Shows moderate Sleep Apnea   Options are to come in or virtual visit to discuss results and decide on treatment options .   Or  Would recommend CPAP as she has significant sx , low oxygen levels during sleep and moderate disease  If ok then begin CPAP 5 to 15 cm h2O with mask of choice  Wear each night for >4hr all night .  Healthy weight loss.  Do not drive if sleepy  Follow up in 2-3 months to evaluate.

## 2019-10-16 NOTE — Telephone Encounter (Signed)
LMTCB x1 for pt.  

## 2019-10-18 NOTE — Telephone Encounter (Signed)
Spoke with pt. She is aware of results. Order for CPAP has been placed. Nothing further was needed.

## 2019-11-18 ENCOUNTER — Other Ambulatory Visit: Payer: Self-pay

## 2019-11-18 ENCOUNTER — Encounter: Payer: Self-pay | Admitting: Pain Medicine

## 2019-11-18 ENCOUNTER — Ambulatory Visit: Payer: No Typology Code available for payment source | Attending: Pain Medicine | Admitting: Pain Medicine

## 2019-11-18 VITALS — BP 129/86 | HR 86 | Temp 97.8°F | Resp 18

## 2019-11-18 DIAGNOSIS — M549 Dorsalgia, unspecified: Secondary | ICD-10-CM

## 2019-11-18 DIAGNOSIS — S29012A Strain of muscle and tendon of back wall of thorax, initial encounter: Secondary | ICD-10-CM | POA: Diagnosis present

## 2019-11-18 HISTORY — DX: Strain of muscle and tendon of back wall of thorax, initial encounter: S29.012A

## 2019-11-18 HISTORY — DX: Dorsalgia, unspecified: M54.9

## 2019-11-18 MED ORDER — ROPIVACAINE HCL 2 MG/ML IJ SOLN
9.0000 mL | Freq: Once | INTRAMUSCULAR | Status: AC
  Administered 2019-11-18: 9 mL

## 2019-11-18 MED ORDER — METHYLPREDNISOLONE ACETATE 80 MG/ML IJ SUSP
80.0000 mg | Freq: Once | INTRAMUSCULAR | Status: AC
  Administered 2019-11-18: 80 mg via INTRA_ARTICULAR

## 2019-11-18 MED ORDER — ROPIVACAINE HCL 2 MG/ML IJ SOLN
INTRAMUSCULAR | Status: AC
  Filled 2019-11-18: qty 10

## 2019-11-18 MED ORDER — METHYLPREDNISOLONE ACETATE 80 MG/ML IJ SUSP
INTRAMUSCULAR | Status: AC
  Filled 2019-11-18: qty 1

## 2019-11-18 NOTE — Patient Instructions (Signed)

## 2019-11-18 NOTE — Progress Notes (Addendum)
PROVIDER NOTE: Information contained herein reflects review and annotations entered in association with encounter. Interpretation of such information and data should be left to medically-trained personnel. Information provided to patient can be located elsewhere in the medical record under "Patient Instructions". Document created using STT-dictation technology, any transcriptional errors that may result from process are unintentional.    Patient: Sarah [name]  Service Category: Procedure  Provider: Gaspar Cola, MD  DOB: 09/09/1970  DOS: 11/18/2019  Location: [facility]  MRN: [id number]  Setting: Ambulatory - outpatient  Referring Provider: McLean-Scocuzza, Olivia Mackie *  Type: Established Patient  Specialty: Interventional Pain Management  PCP: McLean-Scocuzza, Nino Glow, MD   Primary Reason for Visit: Interventional Pain Management Treatment. CC: Back Pain (upper)  Procedure:          Anesthesia, Analgesia, Anxiolysis:  Type: Right rhomboid major Trigger Point Injection  CPT: 20552 Primary Purpose: Therapeutic Region: Upper Back Level: Cervico-thoracic Target Area: Rhomboid Major Muscle Trigger Point Approach: Percutaneous, ipsilateral approach. Laterality: Right-Sided Paravertebral  Type: Local Anesthesia Indication(s): Analgesia         Local Anesthetic: Lidocaine 1-2% Route: Infiltration ([place]/IM) IV Access: Declined Sedation: Declined   Position: Sitting   Indications: 1. Trigger point with back pain (Rhomboid area)   2. Acute upper back pain   3. 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  Palpation of the area  shows an apparent trigger point over the right rhomboid major muscle.  Pre-op Assessment:  Sarah [name] is a 49 y.o. (year old), female patient, seen today for interventional treatment. She  has a past surgical history that includes Cesarean section (12/1987); Cesarean section (05/1993); Abdominal hysterectomy (01/2001); and Colonoscopy with propofol (N/A, 01/29/2016). Sarah [name] has a current medication list which includes the following prescription(s): alprazolam, b complex vitamins, vitamin d3, cyclobenzaprine, loratadine, magnesium oxide, pantoprazole, and tramadol, and the following Facility-Administered Medications: methylprednisolone acetate and ropivacaine (pf) 2 mg/ml (0.2%). Her primarily concern today is the Back Pain (upper)  Initial Vital Signs:  Pulse/HCG Rate: 86  Temp: 97.8 F (36.6 C) Resp: 18 BP: 129/86 SpO2: 100 %  BMI: Estimated body mass index is 44.26 kg/m as calculated from the following:   Height as of 08/28/19: 5\' 2"  (1.575 m).   Weight as of 08/28/19: 242 lb (109.8 kg).  Risk Assessment: Allergies: Reviewed. She is allergic to aspirin and sulfa antibiotics.  Allergy Precautions: None required Coagulopathies: Reviewed. None identified.  Blood-thinner therapy: None at this time Active Infection(s): Reviewed. None identified. Sarah [name] is afebrile  Site Confirmation: Sarah [name] was asked to confirm the procedure and laterality before marking the site Procedure checklist: Completed Consent: Before the procedure and under the influence of no sedative(s), amnesic(s), or anxiolytics, the patient was informed of the treatment options, risks and possible complications. To fulfill our ethical and legal obligations, as recommended by the American Medical Association's Code of Ethics, I have informed the patient of my clinical impression; the nature and purpose of the treatment or procedure; the risks, benefits, and possible complications of the intervention; the alternatives,  including doing nothing; the risk(s) and benefit(s) of  the alternative treatment(s) or procedure(s); and the risk(s) and benefit(s) of doing nothing. The patient was provided information about the general risks and possible complications associated with the procedure. These may include, but are not limited to: failure to achieve desired goals, infection, bleeding, organ or nerve damage, allergic reactions, paralysis, and death. In addition, the patient was informed of those risks and complications associated to the procedure, such as failure to decrease pain; infection; bleeding; organ or nerve damage with subsequent damage to sensory, motor, and/or autonomic systems, resulting in permanent pain, numbness, and/or weakness of one or several areas of the body; allergic reactions; (i.e.: anaphylactic reaction); and/or death. Furthermore, the patient was informed of those risks and complications associated with the medications. These include, but are not limited to: allergic reactions (i.e.: anaphylactic or anaphylactoid reaction(s)); adrenal axis suppression; blood sugar elevation that in diabetics may result in ketoacidosis or comma; water retention that in patients with history of congestive heart failure may result in shortness of breath, pulmonary edema, and decompensation with resultant heart failure; weight gain; swelling or edema; medication-induced neural toxicity; particulate matter embolism and blood vessel occlusion with resultant organ, and/or nervous system infarction; and/or aseptic necrosis of one or more joints. Finally, the patient was informed that Medicine is not an exact science; therefore, there is also the possibility of unforeseen or unpredictable risks and/or possible complications that may result in a catastrophic outcome. The patient indicated having understood very clearly. We have given the patient no guarantees and we have made no promises. Enough time was given to the patient to ask  questions, all of which were answered to the patient's satisfaction. Sarah [name] has indicated that she wanted to continue with the procedure. Attestation: I, the ordering provider, attest that I have discussed with the patient the benefits, risks, side-effects, alternatives, likelihood of achieving goals, and potential problems during recovery for the procedure that I have provided informed consent. Date  Time: 11/18/2019 10:53 AM  Pre-Procedure Preparation:  Monitoring: As per clinic protocol. Respiration, ETCO2, SpO2, BP, heart rate and rhythm monitor placed and checked for adequate function Safety Precautions: Patient was assessed for positional comfort and pressure points before starting the procedure. Time-out: I initiated and conducted the "Time-out" before starting the procedure, as per protocol. The patient was asked to participate by confirming the accuracy of the "Time Out" information. Verification of the correct person, site, and procedure were performed and confirmed by me, the nursing staff, and the patient. "Time-out" conducted as per Joint Commission's Universal Protocol (UP.01.01.01). Time: 1103  Description of Procedure:          Area Prepped: Entire Upper Back Region DuraPrep (Iodine Povacrylex [0.7% available iodine] and Isopropyl Alcohol, 74% w/w) Safety Precautions: Aspiration looking for blood return was conducted prior to all injections. At no point did we inject any substances, as a needle was being advanced. No attempts were made at seeking any paresthesias. Safe injection practices and needle disposal techniques used. Medications properly checked for expiration dates. SDV (single dose vial) medications used. Description of the Procedure: Protocol guidelines were followed. The patient was placed in position over the fluoroscopy table. The target area was identified and the area prepped in the usual manner. Skin & deeper tissues infiltrated with local anesthetic. Appropriate  amount of time allowed to pass for local anesthetics to take effect. The procedure needles were then advanced to the target area. Proper needle placement secured. Negative aspiration confirmed. Solution injected in intermittent fashion, asking for systemic symptoms every  0.5cc of injectate. The needles were then removed and the area cleansed, making sure to leave some of the prepping solution back to take advantage of its long term bactericidal properties.  Vitals:   11/18/19 1052  BP: 129/86  Pulse: 86  Resp: 18  Temp: 97.8 F (36.6 C)  TempSrc: Temporal  SpO2: 100%    Start Time: 1103 hrs. End Time: 1104 hrs. Materials:  Needle(s) Type: Epidural needle Gauge: 25G Length: 1.5-in Medication(s): Please see orders for medications and dosing details.  Imaging Guidance:          Type of Imaging Technique: None used Indication(s): N/A Exposure Time: No patient exposure Contrast: None used. Fluoroscopic Guidance: N/A Ultrasound Guidance: N/A Interpretation: N/A  Antibiotic Prophylaxis:   Anti-infectives (From admission, onward)   None     Indication(s): None identified  Post-operative Assessment:  Post-procedure Vital Signs:  Pulse/HCG Rate: 86  Temp: 97.8 F (36.6 C) Resp: 18 BP: 129/86 SpO2: 100 %  EBL: None  Complications: No immediate post-treatment complications observed by team, or reported by patient.  Note: The patient tolerated the entire procedure well. A repeat set of vitals were taken after the procedure and the patient was kept under observation following institutional policy, for this type of procedure. Post-procedural neurological assessment was performed, showing return to baseline, prior to discharge. The patient was provided with post-procedure discharge instructions, including a section on how to identify potential problems. Should any problems arise concerning this procedure, the patient was given instructions to immediately contact us, at any time,  without hesitation. In any case, we plan to contact the patient by telephone for a follow-up status report regarding this interventional procedure.  Comments:  No additional relevant information.  Plan of Care  Orders:  Orders Placed This Encounter  Procedures  . TRIGGER POINT INJECTION    Scheduling Instructions:     Area: Upper Back     Side: Right     Sedation: None     Timeframe: Today    Order Specific Question:   Where will this procedure be performed?    Answer:   [facility]  . Informed Consent Details: Physician/Practitioner Attestation; Transcribe to consent form and obtain patient signature    Provider Attestation: I, Dierks Dossie Arbour, MD, (Pain Management Specialist), the physician/practitioner, attest that I have discussed with the patient the benefits, risks, side effects, alternatives, likelihood of achieving goals and potential problems during recovery for the procedure that I have provided informed consent.    Scheduling Instructions:     Procedure: Myoneural Block (Trigger Point injection)     Indications: Musculoskeletal pain/myofascial pain secondary to trigger point     Note: Always confirm laterality of pain with Sarah [name], before procedure.     Transcribe to consent form and obtain patient signature.   Chronic Opioid Analgesic:  No opioid analgesics prescribed by our practice.   Medications ordered for procedure: Meds ordered this encounter  Medications  . methylPREDNISolone acetate (DEPO-MEDROL) injection 80 mg  . ropivacaine (PF) 2 mg/mL (0.2%) (NAROPIN) injection 9 mL   Medications administered: Jacqulyn Ducking Liller had no medications administered during this visit.  See the medical record for exact dosing, route, and time of administration.  Follow-up plan:   Return in about 2 weeks (around 12/02/2019) for (VV), (PP).        Interventional treatment options: Planned, scheduled, and/or pending:   Diagnostic/therapeutic right rhomboid muscle  trigger point injection #1 (today)   Under consideration:  Therapeutic right rhomboid muscle trigger point injection #2    Therapeutic/palliative (PRN):   Therapeutic left greater trochanteric bursa injection #2  Therapeutic left gluteal femoral bursa injection #2  Diagnostic/therapeutic left cervical ESI #2     Recent Visits No visits were found meeting these conditions.  Showing recent visits within past 90 days and meeting all other requirements   Today's Visits Date Type Provider Dept  11/18/19 Office Visit Milinda Pointer, MD [facility]  Showing today's visits and meeting all other requirements   Future Appointments No visits were found meeting these conditions.  Showing future appointments within next 90 days and meeting all other requirements   Disposition: Discharge home  Discharge (Date  Time): 11/18/2019; 1110 hrs.   Primary Care Physician: McLean-Scocuzza, Nino Glow, MD Location: [facility] Note by: Gaspar Cola, MD Date: 11/18/2019; Time: 11:42 AM  Disclaimer:  Medicine is not an Chief Strategy Officer. The only guarantee in medicine is that nothing is guaranteed. It is important to note that the decision to proceed with this intervention was based on the information collected from the patient. The Data and conclusions were drawn from the patient's questionnaire, the interview, and the physical examination. Because the information was provided in large part by the patient, it cannot be guaranteed that it has not been purposely or unconsciously manipulated. Every effort has been made to obtain as much relevant data as possible for this evaluation. It is important to note that the conclusions that lead to this procedure are derived in large part from the available data. Always take into account that the treatment will also be dependent on availability of resources and existing treatment guidelines, considered by other Pain Management  Practitioners as being common knowledge and practice, at the time of the intervention. For Medico-Legal purposes, it is also important to point out that variation in procedural techniques and pharmacological choices are the acceptable norm. The indications, contraindications, technique, and results of the above procedure should only be interpreted and judged by a Board-Certified Interventional Pain Specialist with extensive familiarity and expertise in the same exact procedure and technique.

## 2019-11-18 NOTE — Addendum Note (Signed)
Addended by: Milinda Pointer A on: 11/18/2019 11:42 AM   Modules accepted: Orders

## 2019-12-11 ENCOUNTER — Other Ambulatory Visit: Payer: Self-pay

## 2019-12-13 ENCOUNTER — Other Ambulatory Visit: Payer: Self-pay

## 2019-12-13 ENCOUNTER — Ambulatory Visit (INDEPENDENT_AMBULATORY_CARE_PROVIDER_SITE_OTHER): Payer: No Typology Code available for payment source | Admitting: Internal Medicine

## 2019-12-13 ENCOUNTER — Other Ambulatory Visit: Payer: Self-pay | Admitting: Internal Medicine

## 2019-12-13 ENCOUNTER — Encounter: Payer: Self-pay | Admitting: Internal Medicine

## 2019-12-13 VITALS — BP 124/78 | HR 86 | Temp 97.4°F | Ht 62.0 in | Wt 238.2 lb

## 2019-12-13 DIAGNOSIS — F4321 Adjustment disorder with depressed mood: Secondary | ICD-10-CM | POA: Diagnosis not present

## 2019-12-13 DIAGNOSIS — Z6841 Body Mass Index (BMI) 40.0 and over, adult: Secondary | ICD-10-CM

## 2019-12-13 DIAGNOSIS — F419 Anxiety disorder, unspecified: Secondary | ICD-10-CM | POA: Diagnosis not present

## 2019-12-13 DIAGNOSIS — G4733 Obstructive sleep apnea (adult) (pediatric): Secondary | ICD-10-CM

## 2019-12-13 DIAGNOSIS — Z0000 Encounter for general adult medical examination without abnormal findings: Secondary | ICD-10-CM

## 2019-12-13 DIAGNOSIS — Z1231 Encounter for screening mammogram for malignant neoplasm of breast: Secondary | ICD-10-CM

## 2019-12-13 DIAGNOSIS — K219 Gastro-esophageal reflux disease without esophagitis: Secondary | ICD-10-CM

## 2019-12-13 DIAGNOSIS — Z1389 Encounter for screening for other disorder: Secondary | ICD-10-CM

## 2019-12-13 DIAGNOSIS — G4700 Insomnia, unspecified: Secondary | ICD-10-CM

## 2019-12-13 DIAGNOSIS — Z9989 Dependence on other enabling machines and devices: Secondary | ICD-10-CM

## 2019-12-13 DIAGNOSIS — Z1329 Encounter for screening for other suspected endocrine disorder: Secondary | ICD-10-CM

## 2019-12-13 MED ORDER — PANTOPRAZOLE SODIUM 40 MG PO TBEC: 40.0000 mg | DELAYED_RELEASE_TABLET | Freq: Every day | ORAL | 3 refills | Status: DC
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
## 2019-12-13 NOTE — Patient Instructions (Addendum)
chia seeds or flaxseeds (ground)  Premier protein shake    Food Choices for Gastroesophageal Reflux Disease, Adult When you have gastroesophageal reflux disease (GERD), the foods you eat and your eating habits are very important. Choosing the right foods can help ease the discomfort of GERD. Consider working with a diet and nutrition specialist (dietitian) to help you make healthy food choices. What general guidelines should I follow?  Eating plan  Choose healthy foods low in fat, such as fruits, vegetables, whole grains, low-fat dairy products, and lean meat, fish, and poultry.  Eat frequent, small meals instead of three large meals each day. Eat your meals slowly, in a relaxed setting. Avoid bending over or lying down until 2-3 hours after eating.  Limit high-fat foods such as fatty meats or fried foods.  Limit your intake of oils, butter, and shortening to less than 8 teaspoons each day.  Avoid the following: ? Foods that cause symptoms. These may be different for different people. Keep a food diary to keep track of foods that cause symptoms. ? Alcohol. ? Drinking large amounts of liquid with meals. ? Eating meals during the 2-3 hours before bed.  Cook foods using methods other than frying. This may include baking, grilling, or broiling. Lifestyle  Maintain a healthy weight. Ask your health care provider what weight is healthy for you. If you need to lose weight, work with your health care provider to do so safely.  Exercise for at least 30 minutes on 5 or more days each week, or as told by your health care provider.  Avoid wearing clothes that fit tightly around your waist and chest.  Do not use any products that contain nicotine or tobacco, such as cigarettes and e-cigarettes. If you need help quitting, ask your health care provider.  Sleep with the head of your bed raised. Use a wedge under the mattress or blocks under the bed frame to raise the head of the bed. What foods  are not recommended? The items listed may not be a complete list. Talk with your dietitian about what dietary choices are best for you. Grains Pastries or quick breads with added fat. [place] toast. Vegetables Deep fried vegetables. [place] fries. Any vegetables prepared with added fat. Any vegetables that cause symptoms. For some people this may include tomatoes and tomato products, chili peppers, onions and garlic, and horseradish. Fruits Any fruits prepared with added fat. Any fruits that cause symptoms. For some people this may include citrus fruits, such as oranges, grapefruit, pineapple, and lemons. Meats and other protein foods High-fat meats, such as fatty beef or pork, hot dogs, ribs, ham, sausage, salami and bacon. Fried meat or protein, including fried fish and fried chicken. Nuts and nut butters. Dairy Whole milk and chocolate milk. Sour cream. Cream. Ice cream. Cream cheese. Milk shakes. Beverages Coffee and tea, with or without caffeine. Carbonated beverages. Sodas. Energy drinks. Fruit juice made with acidic fruits (such as orange or grapefruit). Tomato juice. Alcoholic drinks. Fats and oils Butter. Margarine. Shortening. Ghee. Sweets and desserts Chocolate and cocoa. Donuts. Seasoning and other foods Pepper. Peppermint and spearmint. Any condiments, herbs, or seasonings that cause symptoms. For some people, this may include curry, hot sauce, or vinegar-based salad dressings. Summary  When you have gastroesophageal reflux disease (GERD), food and lifestyle choices are very important to help ease the discomfort of GERD.  Eat frequent, small meals instead of three large meals each day. Eat your meals slowly, in a relaxed setting. Avoid bending  over or lying down until 2-3 hours after eating.  Limit high-fat foods such as fatty meat or fried foods. This information is not intended to replace advice given to you by your health care provider. Make sure you discuss any questions you  have with your health care provider. Document Revised: 10/04/2018 Document Reviewed: 06/14/2016 Elsevier Patient Education  2020 [employer].  Exercising to Lose Weight Exercise is structured, repetitive physical activity to improve fitness and health. Getting regular exercise is important for everyone. It is especially important if you are overweight. Being overweight increases your risk of heart disease, stroke, diabetes, high blood pressure, and several types of cancer. Reducing your calorie intake and exercising can help you lose weight. Exercise is usually categorized as moderate or vigorous intensity. To lose weight, most people need to do a certain amount of moderate-intensity or vigorous-intensity exercise each week. Moderate-intensity exercise  Moderate-intensity exercise is any activity that gets you moving enough to burn at least three times more energy (calories) than if you were sitting. Examples of moderate exercise include:  Walking a mile in 15 minutes.  Doing light yard work.  Biking at an easy pace. Most people should get at least 150 minutes (2 hours and 30 minutes) a week of moderate-intensity exercise to maintain their body weight. Vigorous-intensity exercise Vigorous-intensity exercise is any activity that gets you moving enough to burn at least six times more calories than if you were sitting. When you exercise at this intensity, you should be working hard enough that you are not able to carry on a conversation. Examples of vigorous exercise include:  Running.  Playing a team sport, such as football, basketball, and soccer.  Jumping rope. Most people should get at least 75 minutes (1 hour and 15 minutes) a week of vigorous-intensity exercise to maintain their body weight. How can exercise affect me? When you exercise enough to burn more calories than you eat, you lose weight. Exercise also reduces body fat and builds muscle. The more muscle you have, the more  calories you burn. Exercise also:  Improves mood.  Reduces stress and tension.  Improves your overall fitness, flexibility, and endurance.  Increases bone strength. The amount of exercise you need to lose weight depends on:  Your age.  The type of exercise.  Any health conditions you have.  Your overall physical ability. Talk to your health care provider about how much exercise you need and what types of activities are safe for you. What actions can I take to lose weight? Nutrition   Make changes to your diet as told by your health care provider or diet and nutrition specialist (dietitian). This may include: ? Eating fewer calories. ? Eating more protein. ? Eating less unhealthy fats. ? Eating a diet that includes fresh fruits and vegetables, whole grains, low-fat dairy products, and lean protein. ? Avoiding foods with added fat, salt, and sugar.  Drink plenty of water while you exercise to prevent dehydration or heat stroke. Activity  Choose an activity that you enjoy and set realistic goals. Your health care provider can help you make an exercise plan that works for you.  Exercise at a moderate or vigorous intensity most days of the week. ? The intensity of exercise may vary from person to person. You can tell how intense a workout is for you by paying attention to your breathing and heartbeat. Most people will notice their breathing and heartbeat get faster with more intense exercise.  Do resistance training twice each  week, such as: ? Push-ups. ? Sit-ups. ? Lifting weights. ? Using resistance bands.  Getting short amounts of exercise can be just as helpful as long structured periods of exercise. If you have trouble finding time to exercise, try to include exercise in your daily routine. ? Get up, stretch, and walk around every 30 minutes throughout the day. ? Go for a walk during your lunch break. ? Park your car farther away from your destination. ? If you take  public transportation, get off one stop early and walk the rest of the way. ? Make phone calls while standing up and walking around. ? Take the stairs instead of elevators or escalators.  Wear comfortable clothes and shoes with good support.  Do not exercise so much that you hurt yourself, feel dizzy, or get very short of breath. Where to find more information  U.S[place] Department of Health and Human Services: [employer]at  Centers for Disease Control and Prevention (CDC): http://www.wolf.info/ Contact a health care provider:  Before starting a new exercise program.  If you have questions or concerns about your weight.  If you have a medical problem that keeps you from exercising. Get help right away if you have any of the following while exercising:  Injury.  Dizziness.  Difficulty breathing or shortness of breath that does not go away when you stop exercising.  Chest pain.  Rapid heartbeat. Summary  Being overweight increases your risk of heart disease, stroke, diabetes, high blood pressure, and several types of cancer.  Losing weight happens when you burn more calories than you eat.  Reducing the amount of calories you eat in addition to getting regular moderate or vigorous exercise each week helps you lose weight. This information is not intended to replace advice given to you by your health care provider. Make sure you discuss any questions you have with your health care provider. Document Revised: 06/26/2017 Document Reviewed: 06/26/2017 Elsevier Patient Education  2020 [employer].

## 2019-12-15 DIAGNOSIS — Z9989 Dependence on other enabling machines and devices: Secondary | ICD-10-CM | POA: Insufficient documentation

## 2020-01-08 ENCOUNTER — Encounter: Payer: Self-pay | Admitting: Internal Medicine

## 2020-01-08 ENCOUNTER — Other Ambulatory Visit: Payer: Self-pay

## 2020-01-08 ENCOUNTER — Ambulatory Visit (INDEPENDENT_AMBULATORY_CARE_PROVIDER_SITE_OTHER): Payer: No Typology Code available for payment source | Admitting: Internal Medicine

## 2020-01-08 VITALS — BP 126/80 | HR 85 | Temp 98.5°F | Ht 62.0 in | Wt 238.2 lb

## 2020-01-08 DIAGNOSIS — R1900 Intra-abdominal and pelvic swelling, mass and lump, unspecified site: Secondary | ICD-10-CM

## 2020-01-08 DIAGNOSIS — Z0000 Encounter for general adult medical examination without abnormal findings: Secondary | ICD-10-CM | POA: Diagnosis not present

## 2020-01-08 NOTE — Progress Notes (Signed)
Patient states she noticed a lump/mass in the abdominal Epigastric region. No know injury or changes before noticing the lump last weekend. No pain, discoloration, or visual abnormalities.

## 2020-01-08 NOTE — Progress Notes (Signed)
Chief Complaint  Patient presents with  . Mass   Acute visit 1. Right upper abdomen/epigastric ab mass small 1-2 cm noted w/in the last 1 week. No pain no n/v, with bearing down mass is more prominent right midline epigastric mass noticed x 1 week ? lipoma vs ventral hernia r/o other i.e enlarged liver. This is making her anxious and BP slightly elevated today  Review of Systems  Constitutional: Negative for weight loss.  HENT: Negative for hearing loss.   Eyes: Negative for blurred vision.  Respiratory: Negative for shortness of breath.   Cardiovascular: Negative for chest pain.  Gastrointestinal: Negative for abdominal pain, nausea and vomiting.       +ab mass  Musculoskeletal: Negative for falls.  Skin: Negative for rash.  Psychiatric/Behavioral: The patient is nervous/anxious.    Past Medical History:  Diagnosis Date  . Allergy   . Chicken pox   . Chronic pain   . GERD (gastroesophageal reflux disease)   . History of kidney stones   . Hyperlipidemia   . Shingles    age 49   Past Surgical History:  Procedure Laterality Date  . ABDOMINAL HYSTERECTOMY  01/2001   DUB h/o abnormal pap ovaries intact age early 54s abnormal pap  . CESAREAN SECTION  12/1987  . CESAREAN SECTION  05/1993  . COLONOSCOPY WITH PROPOFOL N/A 01/29/2016   Procedure: COLONOSCOPY WITH PROPOFOL;  Surgeon: Manya Silvas, MD;  Location: [facility] ENDOSCOPY;  Service: Endoscopy;  Laterality: N/A;   Family History  Problem Relation Age of Onset  . Breast cancer Mother 102  . Cancer Mother        breast  . Colon cancer Father   . Lung cancer Father   . Diabetes Father   . Hypertension Paternal Grandfather   . Breast cancer Maternal Grandmother        09-25-22  . Cancer Maternal Grandmother        breast   . Cancer Cousin        breast    Social History   Socioeconomic History  . Marital status: Widowed    Spouse name: Not on file  . Number of children: Not on file  . Years of education: Not on file   . Highest education level: Not on file  Occupational History  . Not on file  Tobacco Use  . Smoking status: Former Smoker    Packs/day: 0.50    Years: 10.00    Pack years: 5.00    Types: Cigarettes    Quit date: 06/27/2010    Years since quitting: 9.5  . Smokeless tobacco: Never Used  . Tobacco comment: quit 2015---currently using e cig  Vaping Use  . Vaping Use: Former  . Quit date: 02/27/2017  . Devices: quit 09/24/16  Substance and Sexual Activity  . Alcohol use: Yes    Alcohol/week: 0.0 standard drinks    Comment: occasional  . Drug use: No  . Sexual activity: Yes    Partners: Male    Birth control/protection: Surgical    Comment: Husband  Other Topics Concern  . 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 Difficulty of Paying Living Expenses:   Food Insecurity:   . Worried About Charity fundraiser in the Last Year:   . Arboriculturist in the Last Year:   Transportation Needs:   . Film/video editor (Medical):   Marland Kitchen Lack of Transportation (Non-Medical):   Physical Activity:   . Days of Exercise per Week:   . Minutes of Exercise per Session:   Stress:   . Feeling of Stress :   Social Connections:   . Frequency of Communication with Friends and Family:   . Frequency of Social Gatherings with Friends and Family:   . Attends Religious Services:   . Active Member of Clubs or Organizations:   . Attends Archivist Meetings:   Marland Kitchen Marital Status:   Intimate Partner Violence:   . Fear of Current or Ex-Partner:   . Emotionally Abused:   Marland Kitchen Physically Abused:   . Sexually Abused:    Current Meds  Medication Sig  . b complex vitamins tablet Take 1  tablet by mouth daily.  . Cholecalciferol (VITAMIN D3) 2000 units CHEW Chew by mouth daily.  Marland Kitchen loratadine (CLARITIN) 10 MG tablet Take 10 mg by mouth daily.  . magnesium oxide (MAG-OX) 400 MG tablet Take 400 mg by mouth daily.  . pantoprazole (PROTONIX) 40 MG tablet Take 1 tablet (40 mg total) by mouth daily. 30 minutes before   Allergies  Allergen Reactions  . Aspirin     bleeding  . Sulfa Antibiotics Hives   No results found for this or any previous visit (from the past 2160 hour(s)). Objective  Body mass index is 43.57 kg/m. Wt Readings from Last 3 Encounters:  01/08/20 238 lb 3.2 oz (108 kg)  12/13/19 238 lb 3.2 oz (108 kg)  08/28/19 242 lb (109.8 kg)   Temp Readings from Last 3 Encounters:  01/08/20 98.5 F (36.9 C) (Oral)  12/13/19 (!) 97.4 F (36.3 C) (Temporal)  11/18/19 97.8 F (36.6 C) (Temporal)   BP Readings from Last 3 Encounters:  01/08/20 126/86  12/13/19 124/78  11/18/19 129/86   Pulse Readings from Last 3 Encounters:  01/08/20 85  12/13/19 86  11/18/19 86    Physical Exam Vitals and nursing note reviewed.  Constitutional:      Appearance: Normal appearance. She is well-developed and well-groomed. She is morbidly obese.  HENT:     Head: Normocephalic and atraumatic.  Eyes:     Conjunctiva/sclera: Conjunctivae normal.     Pupils: Pupils are equal, round, and reactive to light.  Cardiovascular:     Rate and Rhythm: Normal rate and regular rhythm.     Heart sounds: Normal heart sounds. No murmur heard.   Pulmonary:     Effort: Pulmonary effort is normal.     Breath sounds: Normal breath sounds.  Abdominal:     Palpations: There is mass.       Comments: +ab mass soft non mobile   Skin:    General: Skin is warm and dry.  Neurological:     General: No focal deficit present.     Mental Status: She is alert and oriented to person, place, and time. Mental status is at baseline.     Gait: Gait normal.  Psychiatric:        Attention and  Perception: Attention and perception normal.        Mood and Affect: Mood and affect normal.  Speech: Speech normal.        Behavior: Behavior normal. Behavior is cooperative.        Thought Content: Thought content normal.        Cognition and Memory: Cognition and memory normal.        Judgment: Judgment normal.     Assessment  Plan   Annual Flu shotutd Tdap had 12/07/11 Consider MMR vaccine  Hep B immune   Consider shingrix in future Declines covid 19 vaccine for now  Declines STD testing   Pap had 12/09/16 neg pap neg HPV Q5 years s/p hysterectomy h/o abnormal pap still has ovaries  Due pap by 12/09/2021  mammo11/02/2019 negativeordered upcoming   Referred dermatologybut pt never sawas of 12/13/19 when ready refer [address] but as of today no need  Disc healthy diet choices, exercise and sunscreen rec healthy diet and exercise   Colonoscopysaw 01/29/16 Dr. Tiffany Kocher tubular adenoma + and FH dad colon cancer due 01/28/21  Fasting labs 02/2020   OSA on cpap by pulm nasal pillows  Using and trying to get used to it    Abdominal mass, epigastric right side ? Ventral hernia vs lipoma vs other - Plan: CT ABDOMEN WO CONTRAST  Provider: Dr. Olivia Mackie McLean-Scocuzza-Internal Medicine

## 2020-01-08 NOTE — Patient Instructions (Signed)
We will do CT scan to work this up  Take care  Happy Rudene Anda

## 2020-01-12 ENCOUNTER — Encounter: Payer: Self-pay | Admitting: Internal Medicine

## 2020-01-15 ENCOUNTER — Ambulatory Visit
Admission: RE | Admit: 2020-01-15 | Discharge: 2020-01-15 | Disposition: A | Discharge status: Home / Self Care | Payer: No Typology Code available for payment source | Source: Ambulatory Visit | Attending: Internal Medicine | Admitting: Internal Medicine

## 2020-01-15 ENCOUNTER — Other Ambulatory Visit: Payer: Self-pay

## 2020-01-15 DIAGNOSIS — R1900 Intra-abdominal and pelvic swelling, mass and lump, unspecified site: Secondary | ICD-10-CM | POA: Diagnosis present

## 2020-01-16 ENCOUNTER — Encounter: Payer: Self-pay | Admitting: Internal Medicine

## 2020-01-16 DIAGNOSIS — K8020 Calculus of gallbladder without cholecystitis without obstruction: Secondary | ICD-10-CM

## 2020-01-16 DIAGNOSIS — N200 Calculus of kidney: Secondary | ICD-10-CM

## 2020-01-16 DIAGNOSIS — K429 Umbilical hernia without obstruction or gangrene: Secondary | ICD-10-CM

## 2020-01-16 DIAGNOSIS — K439 Ventral hernia without obstruction or gangrene: Secondary | ICD-10-CM | POA: Insufficient documentation

## 2020-01-16 DIAGNOSIS — K5790 Diverticulosis of intestine, part unspecified, without perforation or abscess without bleeding: Secondary | ICD-10-CM | POA: Insufficient documentation

## 2020-01-16 HISTORY — DX: Umbilical hernia without obstruction or gangrene: K42.9

## 2020-01-16 HISTORY — DX: Calculus of gallbladder without cholecystitis without obstruction: K80.20

## 2020-01-16 HISTORY — DX: Calculus of kidney: N20.0

## 2020-01-16 HISTORY — DX: Ventral hernia without obstruction or gangrene: K43.9

## 2020-01-16 NOTE — Telephone Encounter (Signed)
-----   Message from Delorise Jackson, MD sent at 01/16/2020  7:49 AM EDT ----- Gallstones Fat belly button hernia and another one above this  -if painful rec surgery consult to removal gallbladder and hernia Left kidney stones  Diverticulosis

## 2020-01-28 ENCOUNTER — Ambulatory Visit: Payer: No Typology Code available for payment source

## 2020-02-28 ENCOUNTER — Other Ambulatory Visit: Payer: No Typology Code available for payment source

## 2020-03-10 ENCOUNTER — Encounter: Payer: Self-pay | Admitting: Internal Medicine

## 2020-03-16 ENCOUNTER — Telehealth: Payer: Self-pay | Admitting: Internal Medicine

## 2020-03-16 NOTE — Telephone Encounter (Signed)
Pt would like to know if you would put her lab order in so she can get them done at the [facility] mall? She was scheduled to get them on Friday but she now has to work and this would be easier for her. She is asking for a call back when the order is placed.

## 2020-03-16 NOTE — Telephone Encounter (Signed)
Okay to change? 

## 2020-03-18 ENCOUNTER — Encounter: Payer: Self-pay | Admitting: Internal Medicine

## 2020-03-18 NOTE — Telephone Encounter (Signed)
Changed can go to [facility]

## 2020-03-18 NOTE — Telephone Encounter (Signed)
Please advise, is there anything we need to do to change this to [facility]?

## 2020-03-18 NOTE — Telephone Encounter (Signed)
I have changed the orders to [place] (lab collect). Please notify patient that she can go anytime.

## 2020-03-18 NOTE — Addendum Note (Signed)
Addended by: Leeanne Rio on: 03/18/2020 05:08 PM   Modules accepted: Orders

## 2020-03-19 NOTE — Telephone Encounter (Signed)
Patient informed and verbalized understanding

## 2020-03-20 ENCOUNTER — Other Ambulatory Visit: Payer: No Typology Code available for payment source

## 2020-03-31 ENCOUNTER — Other Ambulatory Visit: Payer: Self-pay

## 2020-03-31 ENCOUNTER — Encounter: Payer: Self-pay | Admitting: *Deleted

## 2020-03-31 ENCOUNTER — Other Ambulatory Visit
Admission: RE | Admit: 2020-03-31 | Discharge: 2020-03-31 | Disposition: A | Discharge status: Home / Self Care | Payer: No Typology Code available for payment source | Attending: Internal Medicine | Admitting: Internal Medicine

## 2020-03-31 DIAGNOSIS — Z1329 Encounter for screening for other suspected endocrine disorder: Secondary | ICD-10-CM | POA: Diagnosis present

## 2020-03-31 DIAGNOSIS — Z6841 Body Mass Index (BMI) 40.0 and over, adult: Secondary | ICD-10-CM | POA: Insufficient documentation

## 2020-03-31 DIAGNOSIS — Z0000 Encounter for general adult medical examination without abnormal findings: Secondary | ICD-10-CM | POA: Insufficient documentation

## 2020-03-31 DIAGNOSIS — Z1389 Encounter for screening for other disorder: Secondary | ICD-10-CM | POA: Diagnosis present

## 2020-03-31 LAB — CBC WITH DIFFERENTIAL/PLATELET
Abs Immature Granulocytes: 0.03 10*3/uL (ref 0.00–0.07)
Basophils Absolute: 0.1 10*3/uL (ref 0.0–0.1)
Basophils Relative: 1 %
Eosinophils Absolute: 0.4 10*3/uL (ref 0.0–0.5)
Eosinophils Relative: 5 %
HCT: 38.7 % (ref 36.0–46.0)
Hemoglobin: 13.2 g/dL (ref 12.0–15.0)
Immature Granulocytes: 0 %
Lymphocytes Relative: 36 %
Lymphs Abs: 2.8 10*3/uL (ref 0.7–4.0)
MCH: 29.5 pg (ref 26.0–34.0)
MCHC: 34.1 g/dL (ref 30.0–36.0)
MCV: 86.6 fL (ref 80.0–100.0)
Monocytes Absolute: 0.4 10*3/uL (ref 0.1–1.0)
Monocytes Relative: 6 %
Neutro Abs: 4.1 10*3/uL (ref 1.7–7.7)
Neutrophils Relative %: 52 %
Platelets: 250 10*3/uL (ref 150–400)
RBC: 4.47 MIL/uL (ref 3.87–5.11)
RDW: 14.2 % (ref 11.5–15.5)
WBC: 7.8 10*3/uL (ref 4.0–10.5)
nRBC: 0 % (ref 0.0–0.2)

## 2020-03-31 LAB — URINALYSIS, ROUTINE W REFLEX MICROSCOPIC
Bacteria, UA: NONE SEEN
Bilirubin Urine: NEGATIVE
Glucose, UA: NEGATIVE mg/dL
Hgb urine dipstick: NEGATIVE
Ketones, ur: NEGATIVE mg/dL
Leukocytes,Ua: NEGATIVE
Nitrite: NEGATIVE
Protein, ur: 30 mg/dL — AB
Specific Gravity, Urine: 1.033 — ABNORMAL HIGH (ref 1.005–1.030)
pH: 5 (ref 5.0–8.0)

## 2020-03-31 LAB — COMPREHENSIVE METABOLIC PANEL
ALT: 29 U/L (ref 0–44)
AST: 23 U/L (ref 15–41)
Albumin: 4.2 g/dL (ref 3.5–5.0)
Alkaline Phosphatase: 71 U/L (ref 38–126)
Anion gap: 8 (ref 5–15)
BUN: 20 mg/dL (ref 6–20)
CO2: 26 mmol/L (ref 22–32)
Calcium: 9.2 mg/dL (ref 8.9–10.3)
Chloride: 107 mmol/L (ref 98–111)
Creatinine, Ser: 0.73 mg/dL (ref 0.44–1.00)
GFR calc non Af Amer: >60 mL/min (ref >60)
Glucose, Bld: 107 mg/dL — ABNORMAL HIGH (ref 70–99)
Potassium: 4.1 mmol/L (ref 3.5–5.1)
Sodium: 141 mmol/L (ref 135–145)
Total Bilirubin: 0.8 mg/dL (ref 0.3–1.2)
Total Protein: 7 g/dL (ref 6.5–8.1)

## 2020-03-31 LAB — LIPID PANEL
Cholesterol: 216 mg/dL — ABNORMAL HIGH (ref 0–200)
HDL: 70 mg/dL (ref >40)
LDL Cholesterol: 130 mg/dL — ABNORMAL HIGH (ref 0–99)
Total CHOL/HDL Ratio: 3.1 RATIO
Triglycerides: 78 mg/dL (ref <150)
VLDL: 16 mg/dL (ref 0–40)

## 2020-03-31 LAB — TSH: TSH: 3.068 u[IU]/mL (ref 0.350–4.500)

## 2020-04-01 ENCOUNTER — Encounter: Payer: Self-pay | Admitting: Internal Medicine

## 2020-05-06 ENCOUNTER — Other Ambulatory Visit: Payer: Self-pay

## 2020-05-06 ENCOUNTER — Ambulatory Visit
Admission: RE | Admit: 2020-05-06 | Discharge: 2020-05-06 | Disposition: A | Discharge status: Home / Self Care | Payer: No Typology Code available for payment source | Source: Ambulatory Visit | Attending: Internal Medicine | Admitting: Internal Medicine

## 2020-05-06 DIAGNOSIS — Z1231 Encounter for screening mammogram for malignant neoplasm of breast: Secondary | ICD-10-CM | POA: Diagnosis present

## 2020-05-25 ENCOUNTER — Other Ambulatory Visit: Payer: Self-pay | Admitting: Pain Medicine

## 2020-05-25 DIAGNOSIS — M25562 Pain in left knee: Secondary | ICD-10-CM

## 2020-05-25 DIAGNOSIS — M25552 Pain in left hip: Secondary | ICD-10-CM

## 2020-05-25 MED ORDER — PREDNISONE 20 MG PO TABS: ORAL_TABLET | ORAL | 0 refills | Status: AC

## 2020-06-06 ENCOUNTER — Other Ambulatory Visit: Payer: Self-pay | Admitting: Orthopedic Surgery

## 2020-06-06 ENCOUNTER — Telehealth: Payer: No Typology Code available for payment source | Admitting: Orthopedic Surgery

## 2020-06-06 DIAGNOSIS — J069 Acute upper respiratory infection, unspecified: Secondary | ICD-10-CM

## 2020-06-06 MED ORDER — BENZONATATE 100 MG PO CAPS: 100.0000 mg | ORAL_CAPSULE | Freq: Three times a day (TID) | ORAL | 0 refills | Status: DC | PRN

## 2020-06-06 MED ORDER — AZELASTINE HCL 0.1 % NA SOLN: 2.0000 | Freq: Two times a day (BID) | NASAL | 12 refills | Status: DC

## 2020-06-06 NOTE — Progress Notes (Signed)
We are sorry you are not feeling well.  Here is how we plan to help!  Based on what you have shared with me, it looks like you may have a viral upper respiratory infection.  Upper respiratory infections are caused by a large number of viruses; however, rhinovirus is the most common cause.   Symptoms vary from person to person, with common symptoms including sore throat, cough, fatigue or lack of energy and feeling of general discomfort.  A low-grade fever of up to 100.4 may present, but is often uncommon.  Symptoms vary however, and are closely related to a person's age or underlying illnesses.  The most common symptoms associated with an upper respiratory infection are nasal discharge or congestion, cough, sneezing, headache and pressure in the ears and face.  These symptoms usually persist for about 3 to 10 days, but can last up to 2 weeks.  It is important to know that upper respiratory infections do not cause serious illness or complications in most cases.    Upper respiratory infections can be transmitted from person to person, with the most common method of transmission being a person's hands.  The virus is able to live on the skin and can infect other persons for up to 2 hours after direct contact.  Also, these can be transmitted when someone coughs or sneezes; thus, it is important to cover the mouth to reduce this risk.  To keep the spread of the illness at bay, good hand hygiene is very important.  This is an infection that is most likely caused by a virus. There are no specific treatments other than to help you with the symptoms until the infection runs its course.  We are sorry you are not feeling well.  Here is how we plan to help!   For nasal congestion, you may use an oral decongestants such as Mucinex D or if you have glaucoma or high blood pressure use plain Mucinex.  Saline nasal spray or nasal drops can help and can safely be used as often as needed for congestion.  For your congestion,  I have prescribed Azelastine nasal spray two sprays in each nostril twice a day  If you do not have a history of heart disease, hypertension, diabetes or thyroid disease, prostate/bladder issues or glaucoma, you may also use Sudafed to treat nasal congestion.  It is highly recommended that you consult with a pharmacist or your primary care physician to ensure this medication is safe for you to take.     If you have a cough, you may use cough suppressants such as Delsym and Robitussin.  If you have glaucoma or high blood pressure, you can also use Coricidin HBP.   For cough I have prescribed for you A prescription cough medication called Tessalon Perles 100 mg. You may take 1-2 capsules every 8 hours as needed for cough  If you have a sore or scratchy throat, use a saltwater gargle-  to  teaspoon of salt dissolved in a 4-ounce to 8-ounce glass of warm water.  Gargle the solution for approximately 15-30 seconds and then spit.  It is important not to swallow the solution.  You can also use throat lozenges/cough drops and Chloraseptic spray to help with throat pain or discomfort.  Warm or cold liquids can also be helpful in relieving throat pain.  For headache, pain or general discomfort, you can use Ibuprofen or Tylenol as directed.   Some authorities believe that zinc sprays or the use of   Echinacea may shorten the course of your symptoms.   HOME CARE . Only take medications as instructed by your medical team. . Be sure to drink plenty of fluids. Water is fine as well as fruit juices, sodas and electrolyte beverages. You may want to stay away from caffeine or alcohol. If you are nauseated, try taking small sips of liquids. How do you know if you are getting enough fluid? Your urine should be a pale yellow or almost colorless. . Get rest. . Taking a steamy shower or using a humidifier may help nasal congestion and ease sore throat pain. You can place a towel over your head and breathe in the steam  from hot water coming from a faucet. . Using a saline nasal spray works much the same way. . Cough drops, hard candies and sore throat lozenges may ease your cough. . Avoid close contacts especially the very young and the elderly . Cover your mouth if you cough or sneeze . Always remember to wash your hands.   GET HELP RIGHT AWAY IF: . You develop worsening fever. . If your symptoms do not improve within 10 days . You develop yellow or green discharge from your nose over 3 days. . You have coughing fits . You develop a severe head ache or visual changes. . You develop shortness of breath, difficulty breathing or start having chest pain . Your symptoms persist after you have completed your treatment plan  MAKE SURE YOU   Understand these instructions.  Will watch your condition.  Will get help right away if you are not doing well or get worse.  Your e-visit answers were reviewed by a board certified advanced clinical practitioner to complete your personal care plan. Depending upon the condition, your plan could have included both over the counter or prescription medications. Please review your pharmacy choice. If there is a problem, you may call our nursing hot line at and have the prescription routed to another pharmacy. Your safety is important to us. If you have drug allergies check your prescription carefully.   You can use MyChart to ask questions about today's visit, request a non-urgent call back, or ask for a work or school excuse for 24 hours related to this e-Visit. If it has been greater than 24 hours you will need to follow up with your provider, or enter a new e-Visit to address those concerns. You will get an e-mail in the next two days asking about your experience.  I hope that your e-visit has been valuable and will speed your recovery. Thank you for using e-visits.     Greater than 5 minutes, yet less than 10 minutes of time have been spent researching, coordinating  and implementing care for this patient today.   

## 2020-06-13 ENCOUNTER — Telehealth: Payer: No Typology Code available for payment source | Admitting: Nurse Practitioner

## 2020-06-13 DIAGNOSIS — J0100 Acute maxillary sinusitis, unspecified: Secondary | ICD-10-CM | POA: Diagnosis not present

## 2020-06-13 MED ORDER — AMOXICILLIN-POT CLAVULANATE 875-125 MG PO TABS: 1.0000 | ORAL_TABLET | Freq: Two times a day (BID) | ORAL | 0 refills | Status: DC

## 2020-06-13 NOTE — Progress Notes (Signed)

## 2020-06-13 NOTE — Addendum Note (Signed)
Addended by: Chevis Pretty on: 06/13/2020 01:31 PM   Modules accepted: Orders

## 2020-06-15 ENCOUNTER — Telehealth: Payer: No Typology Code available for payment source | Admitting: Physician Assistant

## 2020-06-15 ENCOUNTER — Other Ambulatory Visit: Payer: Self-pay | Admitting: Physician Assistant

## 2020-06-15 DIAGNOSIS — N760 Acute vaginitis: Secondary | ICD-10-CM | POA: Diagnosis not present

## 2020-06-15 MED ORDER — FLUCONAZOLE 150 MG PO TABS: 150.0000 mg | ORAL_TABLET | Freq: Once | ORAL | 0 refills | Status: AC

## 2020-06-15 NOTE — Progress Notes (Signed)

## 2020-06-27 DIAGNOSIS — T8149XA Infection following a procedure, other surgical site, initial encounter: Secondary | ICD-10-CM

## 2020-06-27 HISTORY — PX: HERNIA REPAIR: SHX51

## 2020-06-27 HISTORY — DX: Infection following a procedure, other surgical site, initial encounter: T81.49XA

## 2020-10-18 ENCOUNTER — Encounter: Payer: Self-pay | Admitting: Internal Medicine

## 2020-10-19 ENCOUNTER — Encounter: Payer: Self-pay | Admitting: Adult Health

## 2020-10-19 ENCOUNTER — Other Ambulatory Visit: Payer: Self-pay

## 2020-10-19 ENCOUNTER — Ambulatory Visit (INDEPENDENT_AMBULATORY_CARE_PROVIDER_SITE_OTHER): Payer: No Typology Code available for payment source | Admitting: Adult Health

## 2020-10-19 VITALS — BP 92/70 | HR 69 | Temp 97.9°F | Ht 62.01 in | Wt 218.8 lb

## 2020-10-19 DIAGNOSIS — K219 Gastro-esophageal reflux disease without esophagitis: Secondary | ICD-10-CM | POA: Diagnosis not present

## 2020-10-19 DIAGNOSIS — R1011 Right upper quadrant pain: Secondary | ICD-10-CM

## 2020-10-19 DIAGNOSIS — K8020 Calculus of gallbladder without cholecystitis without obstruction: Secondary | ICD-10-CM

## 2020-10-19 DIAGNOSIS — R112 Nausea with vomiting, unspecified: Secondary | ICD-10-CM

## 2020-10-19 DIAGNOSIS — R10826 Epigastric rebound abdominal tenderness: Secondary | ICD-10-CM

## 2020-10-19 NOTE — Progress Notes (Signed)
Subjective:     Sarah Sarah [name] is a 51 y.o. female who presents for evaluation of abdominal pain. Onset was 4 day ago. Symptoms have been gradually improving. The pain is described as aching, burning and pressure-like, and is 5/10 in intensity. Pain is located in the RUQ without radiation.  Aggravating factors: NSAIDs.  Alleviating factors: none. Associated symptoms: belching and nausea. The patient denies anorexia, constipation, fever, flatus, frequency, headache, hematochezia, hematuria, melena, myalgias, sweats and vomiting.  Denies urination problems.   The patient's history has been marked as reviewed and updated as appropriate.  Patient  denies any fever, body aches,chills, rash, chest pain, shortness of breath, vomiting, or diarrhea.  Denies dizziness, lightheadedness, pre syncopal or syncopal episodes.   Past Medical History:  Diagnosis Date  . Allergy   . Chicken pox   . Chronic pain   . GERD (gastroesophageal reflux disease)   . History of kidney stones   . Hyperlipidemia   . Shingles    age 61   Patient Active Problem List   Diagnosis Date Noted  . Gallstones 01/16/2020  . Kidney stone on left side 01/16/2020  . Ventral hernia 01/16/2020  . Umbilical hernia 63/14/9702  . Diverticulosis 01/16/2020  . OSA on CPAP 12/15/2019  . Trigger point with back pain (Rhomboid area) 11/18/2019  . Acute upper back pain 11/18/2019  . Rhomboid muscle strain, initial encounter 11/18/2019  . Left lumbar radiculopathy 06/14/2019  . DDD (degenerative disc disease), lumbar 06/14/2019  . Spondylosis 06/14/2019  . Insomnia 06/14/2019  . Chronic pain syndrome 10/08/2018  . Class 3 severe obesity due to excess calories without serious comorbidity with body mass index (BMI) of 40.0 to 44.9 in adult ([place]) 12/15/2017  . Annual physical exam 12/09/2016  . Cervical facet hypertrophy 12/07/2016  . Osteoarthritis of cervical spine 12/07/2016  . Cervical facet syndrome (HCC) (Left)  12/07/2016  . Cervical radiculitis (Left) 12/07/2016  . Myofascial pain syndrome, cervical 12/07/2016  . Musculoskeletal pain 12/07/2016  . Neurogenic pain 12/07/2016  . Chronic hip pain (Left) 03/15/2016  . History of nephrolithiasis 01/26/2016  . Chronic knee pain (Left) 11/17/2015  . Morbid obesity ([place]) 09/20/2015  . GERD (gastroesophageal reflux disease) 09/20/2015   Past Surgical History:  Procedure Laterality Date  . ABDOMINAL HYSTERECTOMY  01/2001   DUB h/o abnormal pap ovaries intact age early 15s abnormal pap  . CESAREAN SECTION  12/1987  . CESAREAN SECTION  05/1993  . COLONOSCOPY WITH PROPOFOL N/A 01/29/2016   Procedure: COLONOSCOPY WITH PROPOFOL;  Surgeon: Manya Silvas, MD;  Location: [facility] ENDOSCOPY;  Service: Endoscopy;  Laterality: N/A;   Family History  Problem Relation Age of Onset  . Breast cancer Mother 98  . Cancer Mother        breast  . Colon cancer Father   . Lung cancer Father   . Diabetes Father   . Hypertension Paternal Grandfather   . Breast cancer Maternal Grandmother        30's  . Cancer Maternal Grandmother        breast   . Cancer Cousin        breast    Social History   Socioeconomic History  . Marital status: Widowed    Spouse name: Not on file  . Number of children: Not on file  . Years of education: Not on file  . Highest education level: Not on file  Occupational History  . Not on file  Tobacco Use  .  Smoking status: Former Smoker    Packs/day: 0.50    Years: 10.00    Pack years: 5.00    Types: Cigarettes    Quit date: 06/27/2010    Years since quitting: 10.3  . Smokeless tobacco: Never Used  . Tobacco comment: quit 2015---currently using e cig  Vaping Use  . Vaping Use: Former  . Quit date: 02/27/2017  . Devices: quit 2018  Substance and Sexual Activity  . Alcohol use: Yes    Alcohol/week: 0.0 standard drinks    Comment: occasional  . Drug use: Sarah [name]  . Sexual activity: Yes    Partners: Male    Birth  control/protection: Surgical    Comment: Husband  Other Topics Concern  . 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 azelastine (ASTELIN) 0.1 % nasal spray Place 2 sprays into both nostrils 2 (two) times daily. 30 mL 12  . azelastine (ASTELIN) 0.1 % nasal spray PLACE 2 SPRAYS INTO BOTH NOSTRILS 2 (TWO) TIMES DAILY. 30 mL 12  . b complex vitamins tablet Take 1 tablet by mouth daily.    Marland Kitchen loratadine (CLARITIN) 10 MG tablet Take 10 mg by mouth daily.    . magnesium oxide (MAG-OX) 400 MG tablet Take 400 mg by mouth daily.    . pantoprazole (PROTONIX) 40 MG tablet Take 1 tablet (40 mg total) by mouth daily. 30 minutes before 90 tablet 3  . amoxicillin-clavulanate (AUGMENTIN) 875-125 MG tablet Take 1 tablet by mouth 2 (two) times daily. 14 tablet 0  . benzonatate (TESSALON) 100 MG capsule Take 1-2 capsules (100-200 mg total) by mouth 3 (three) times daily as needed for cough. 20 capsule 0  . Cholecalciferol (VITAMIN D3) 2000 units CHEW Chew by mouth daily.    . fluconazole (DIFLUCAN) 150 MG tablet TAKE 1 TABLET (150 MG TOTAL) BY MOUTH ONCE FOR 1 DOSE. MAY REPEAT IN 72 HOURS IF SYMPTOMS PERSIST (Patient not taking: Reported on 10/19/2020) 2 tablet 0  . pantoprazole (PROTONIX) 40 MG tablet TAKE 1 TABLET BY MOUTH DAILY. 30 MINUTES BEFORE A MEAL 90 tablet 3  . predniSONE (DELTASONE) 20 MG tablet TAKE 3 TABLETS BY MOUTH DAILY WITH BREAKFAST FOR 3 DAYS, THEN 2  TABLETS DAILY WITH BREAKFAST FOR 3 DAYS, THEN 1 TABLET DAILY WITH BREAKFAST FOR 3 DAYS. 18 tablet 0   Sarah [name] current facility-administered medications for this visit.   Allergies: Aspirin and Sulfa antibiotics  Reviewed imaging CT abdomen as below from 01/15/2020. IMPRESSION: 1. Small fat containing umbilical and ventral wall hernias as above. 2. Cholelithiasis without acute inflammation. 3. Nonobstructing left nephrolithiasis. 4. Colonic diverticulosis without CT evidence for diverticulitis.   Electronically Signed   By: Constance Holster M.D.   On: 01/15/2020 23:16  Review of Systems Pertinent items are noted in HPI.     Objective:    BP 92/70 (BP  Location: Left Arm, Patient Position: Sitting)   Pulse 69   Temp 97.9 F (36.6 C)   Ht 5' 2.01" (1.575 m)   Wt 218 lb 12.8 oz (99.2 kg)   SpO2 99%   BMI 40.01 kg/m   General Appearance:    Alert, cooperative, Sarah [name] distress, appears stated age  Head:    Normocephalic, without obvious abnormality, atraumatic  Eyes:    PERRL, conjunctiva/corneas clear, EOM's intact, fundi    benign, both eyes  Ears:    Normal TM's and external ear canals, both ears  Nose:   Nares normal, septum midline, mucosa normal, Sarah [name] drainage    or sinus tenderness  Throat:   Lips, mucosa, and tongue normal; teeth and gums normal  Neck:   Supple, symmetrical, trachea midline, Sarah [name] adenopathy;    thyroid:  Sarah [name] enlargement/tenderness/nodules; Sarah [name] carotid   bruit or JVD  Back:     Symmetric, Sarah [name] curvature, ROM normal, Sarah [name] CVA tenderness  Lungs:     Clear to auscultation bilaterally, respirations unlabored  Chest Wall:    Sarah [name] tenderness or deformity   Heart:    Regular rate and rhythm, S1 and S2 normal, Sarah [name] murmur, rub   or gallop  Breast Exam:    Sarah [name] tenderness, masses, or nipple abnormality  Abdomen:     Soft, tender left upper quadrant noted,, bowel sounds active all four quadrants,    Sarah [name] masses, Sarah [name] organomegaly  Genitalia:    Normal female without lesion, discharge  or tenderness  Rectal:    Normal tone, normal prostate, Sarah [name] masses or tenderness;   guaiac negative stool  Extremities:   Extremities normal, atraumatic, Sarah [name] cyanosis or edema  Pulses:   2+ and symmetric all extremities  Skin:   Skin color, texture, turgor normal, Sarah [name] rashes or lesions  Lymph nodes:   Cervical, supraclavicular, and axillary nodes normal  Neurologic:   CNII-XII intact, normal strength, sensation and reflexes    throughout      Assessment:    Abdominal pain, likely secondary to The primary encounter diagnosis was Gallstones. Diagnoses of Gastroesophageal reflux disease without esophagitis, Nausea and vomiting, intractability of vomiting not specified, unspecified vomiting type, Right upper quadrant abdominal pain, Calculus of gallbladder without cholecystitis without obstruction, and Epigastric abdominal tenderness with rebound tenderness were also pertinent to this visit..    Suspect gallbladder.    Plan:    The diagnosis was discussed with the patient and evaluation and treatment plans outlined. See orders for lab and imaging studies. Referral to General Surgery. Follow up as needed. referal to gastroenterology.      Orders Placed This Encounter  Procedures  . US Abdomen Limited RUQ (LIVER/GB)  . CT Abdomen Pelvis W Contrast  . CBC with Differential/Platelet  . Comprehensive Metabolic Panel (CMET)  . Lipase  . Amylase  . Ambulatory referral to General Surgery  . Ambulatory referral to Gastroenterology  Return in about 3 years (around 10/20/2023), or if symptoms worsen or fail to improve, for at any time for any worsening symptoms, Go to Emergency room/ urgent care if worse.

## 2020-10-19 NOTE — Patient Instructions (Signed)
Cholelithiasis  Cholelithiasis happens when gallstones form in the gallbladder. The gallbladder stores bile. Bile is a fluid that helps digest fats. Bile can harden and form into gallstones. If they cause a blockage, they can cause pain (gallbladder attack). What are the causes? This condition may be caused by:  Some blood diseases, such as sickle cell anemia.  Too much of a fat-like substance (cholesterol) in your bile.  Not enough bile salts in your bile. These salts help the body absorb and digest fats.  The gallbladder not emptying fully or often enough. This is common in pregnant women. What increases the risk? The following factors may make you more likely to develop this condition:  Being female.  Being pregnant many times.  Eating a lot of fried foods, fat, and refined carbs (refined carbohydrates).  Being very overweight (obese).  Being older than age 66.  Using medicines with female hormones in them for a long time.  Losing weight fast.  Having gallstones in your family.  Having some health problems, such as diabetes, Crohn's disease, or liver disease. What are the signs or symptoms? Often, there may be gallstones but no symptoms. These gallstones are called silent gallstones. If a gallstone causes a blockage, you may get sudden pain. The pain:  Can be in the upper right part of your belly (abdomen).  Normally comes at night or after you eat.  Can last an hour or more.  Can spread to your right shoulder, back, or chest.  Can feel like discomfort, burning, or fullness in the upper part of your belly (indigestion). If the blockage lasts more than a few hours, you can get an infection or swelling. You may:  Feel like you may vomit.  Vomit.  Feel bloated.  Have belly pain for 5 hours or more.  Feel tender in your belly, often in the upper right part and under your ribs.  Have fever or chills.  Have skin or the white parts of your eyes turn yellow  (jaundice).  Have dark pee (urine) or pale poop (stool). How is this treated? Treatment for this condition depends on how bad you feel. If you have symptoms, you may need:  Home care, if symptoms are not very bad. ? Do not eat for 12-24 hours. Drink only water and clear liquids. ? Start to eat simple or clear foods after 1 or 2 days. Try broths and crackers. ? You may need medicines for pain or stomach upset or both. ? If you have an infection, you will need antibiotics.  A hospital stay, if you have very bad pain or a very bad infection.  Surgery to remove your gallbladder. You may need this if: ? Gallstones keep coming back. ? You have very bad symptoms.  Medicines to break up gallstones. Medicines: ? Are best for small gallstones. ? May be used for up to 6-12 months.  A procedure to find and take out gallstones or to break up gallstones. Follow these instructions at home: Medicines  Take over-the-counter and prescription medicines only as told by your doctor.  If you were prescribed an antibiotic medicine, take it as told by your doctor. Do not stop taking the antibiotic even if you start to feel better.  Ask your doctor if the medicine prescribed to you requires you to avoid driving or using machinery. Eating and drinking  Drink enough fluid to keep your urine pale yellow. Drink water or clear fluids. This is important when you have pain.  Eat  healthy foods. Choose: ? Fewer fatty foods, such as fried foods. ? Fewer refined carbs. Avoid breads and grains that are highly processed, such as white bread and white rice. Choose whole grains, such as whole-wheat bread and brown rice. ? More fiber. Almonds, fresh fruit, and beans are healthy sources. General instructions  Keep a healthy weight.  Keep all follow-up visits as told by your doctor. This is important. Where to find more information  [employer] of Diabetes and Digestive and Kidney Diseases:  [email or web address] Contact a doctor if:  You have sudden pain in the upper right part of your belly. Pain might spread to your right shoulder, back, or chest.  You have been diagnosed with gallstones that have no symptoms and you get: ? Belly pain. ? Discomfort, burning, or fullness in the upper part of your abdomen.  You have dark urine or pale stools. Get help right away if:  You have sudden pain in the upper right part of your abdomen, and the pain lasts more than 2 hours.  You have pain in your abdomen, and: ? It lasts more than 5 hours. ? It keeps getting worse.  You have a fever or chills.  You keep feeling like you may vomit.  You keep vomiting.  Your skin or the white parts of your eyes turn yellow. Summary  Cholelithiasis happens when gallstones form in the gallbladder.  This condition may be caused by a blood disease, too much of a fat-like substance in the bile, or not enough bile salts in bile.  Treatment for this condition depends on how bad you feel.  If you have symptoms, do not eat or drink. You may need medicines. You may need a hospital stay for very bad pain or a very bad infection.  You may need surgery if gallstones keep coming back or if you have very bad symptoms. This information is not intended to replace advice given to you by your health care provider. Make sure you discuss any questions you have with your health care provider. Document Revised: 08/02/2019 Document Reviewed: 05/06/2019 Elsevier Patient Education  2021 [place]. Abdominal Pain, Adult Many things can cause belly (abdominal) pain. Most times, belly pain is not dangerous. Many cases of belly pain can be watched and treated at home. Sometimes, though, belly pain is serious. Your doctor will try to find the cause of your belly pain. Follow these instructions at home: Medicines  Take over-the-counter and prescription medicines only as told by your doctor.  Do not take medicines  that help you poop (laxatives) unless told by your doctor. General instructions  Watch your belly pain for any changes.  Drink enough fluid to keep your pee (urine) pale yellow.  Keep all follow-up visits as told by your doctor. This is important.   Contact a doctor if:  Your belly pain changes or gets worse.  You are not hungry, or you lose weight without trying.  You are having trouble pooping (constipated) or have watery poop (diarrhea) for more than 2-3 days.  You have pain when you pee or poop.  Your belly pain wakes you up at night.  Your pain gets worse with meals, after eating, or with certain foods.  You are vomiting and cannot keep anything down.  You have a fever.  You have blood in your pee. Get help right away if:  Your pain does not go away as soon as your doctor says it should.  You cannot stop vomiting.  Your  pain is only in areas of your belly, such as the right side or the left lower part of the belly.  You have bloody or black poop, or poop that looks like tar.  You have very bad pain, cramping, or bloating in your belly.  You have signs of not having enough fluid or water in your body (dehydration), such as: ? Dark pee, very little pee, or no pee. ? Cracked lips. ? Dry mouth. ? Sunken eyes. ? Sleepiness. ? Weakness.  You have trouble breathing or chest pain. Summary  Many cases of belly pain can be watched and treated at home.  Watch your belly pain for any changes.  Take over-the-counter and prescription medicines only as told by your doctor.  Contact a doctor if your belly pain changes or gets worse.  Get help right away if you have very bad pain, cramping, or bloating in your belly. This information is not intended to replace advice given to you by your health care provider. Make sure you discuss any questions you have with your health care provider. Document Revised: 10/22/2018 Document Reviewed: 10/22/2018 Elsevier Patient  Education  [place].

## 2020-10-20 ENCOUNTER — Ambulatory Visit
Admission: RE | Admit: 2020-10-20 | Discharge: 2020-10-20 | Disposition: A | Discharge status: Home / Self Care | Payer: No Typology Code available for payment source | Source: Ambulatory Visit | Attending: Adult Health | Admitting: Adult Health

## 2020-10-20 ENCOUNTER — Telehealth: Payer: Self-pay | Admitting: Internal Medicine

## 2020-10-20 ENCOUNTER — Encounter: Payer: Self-pay | Admitting: Adult Health

## 2020-10-20 DIAGNOSIS — R112 Nausea with vomiting, unspecified: Secondary | ICD-10-CM | POA: Insufficient documentation

## 2020-10-20 DIAGNOSIS — K8020 Calculus of gallbladder without cholecystitis without obstruction: Secondary | ICD-10-CM | POA: Diagnosis present

## 2020-10-20 LAB — LIPASE: Lipase: 41 U/L (ref 11.0–59.0)

## 2020-10-20 LAB — CBC WITH DIFFERENTIAL/PLATELET
Basophils Absolute: 0.1 10*3/uL (ref 0.0–0.1)
Basophils Relative: 0.7 % (ref 0.0–3.0)
Eosinophils Absolute: 0.2 10*3/uL (ref 0.0–0.7)
Eosinophils Relative: 2.1 % (ref 0.0–5.0)
HCT: 39.6 % (ref 36.0–46.0)
Hemoglobin: 13.5 g/dL (ref 12.0–15.0)
Lymphocytes Relative: 31.8 % (ref 12.0–46.0)
Lymphs Abs: 2.8 10*3/uL (ref 0.7–4.0)
MCHC: 34.1 g/dL (ref 30.0–36.0)
MCV: 88.1 fl (ref 78.0–100.0)
Monocytes Absolute: 0.5 10*3/uL (ref 0.1–1.0)
Monocytes Relative: 5.6 % (ref 3.0–12.0)
Neutro Abs: 5.2 10*3/uL (ref 1.4–7.7)
Neutrophils Relative %: 59.8 % (ref 43.0–77.0)
Platelets: 237 10*3/uL (ref 150.0–400.0)
RBC: 4.49 Mil/uL (ref 3.87–5.11)
RDW: 14.7 % (ref 11.5–15.5)
WBC: 8.7 10*3/uL (ref 4.0–10.5)

## 2020-10-20 LAB — COMPREHENSIVE METABOLIC PANEL
ALT: 17 U/L (ref 0–35)
AST: 15 U/L (ref 0–37)
Albumin: 4.2 g/dL (ref 3.5–5.2)
Alkaline Phosphatase: 71 U/L (ref 39–117)
BUN: 21 mg/dL (ref 6–23)
CO2: 31 mEq/L (ref 19–32)
Calcium: 9.9 mg/dL (ref 8.4–10.5)
Chloride: 102 mEq/L (ref 96–112)
Creatinine, Ser: 0.86 mg/dL (ref 0.40–1.20)
GFR: 78.02 mL/min (ref >60.00)
Glucose, Bld: 90 mg/dL (ref 70–99)
Potassium: 4.5 mEq/L (ref 3.5–5.1)
Sodium: 139 mEq/L (ref 135–145)
Total Bilirubin: 0.4 mg/dL (ref 0.2–1.2)
Total Protein: 6.6 g/dL (ref 6.0–8.3)

## 2020-10-20 LAB — AMYLASE: Amylase: 47 U/L (ref 27–131)

## 2020-10-20 MED ORDER — IOHEXOL 300 MG/ML  SOLN
100.0000 mL | Freq: Once | INTRAMUSCULAR | Status: AC | PRN
  Administered 2020-10-20: 100 mL via INTRAVENOUS

## 2020-10-20 NOTE — Progress Notes (Signed)
Questionable diverticulosis and thickening of colon, do recommend that she see gastroenterology for follow up- referral was placed. Given her pain and gallstones surgical consult advised- referral in place.  IMPRESSION: 1. Cholelithiasis. Gallbladder wall does not appear thickened by CT. No biliary duct dilatation evident.  2. Sigmoid diverticula without diverticulitis. Mild wall thickening in the sigmoid colon may be due to muscular hypertrophy due to chronic diverticulosis.  3. No bowel obstruction. No abscess in the abdomen or pelvis. Appendix appears normal.  4. Nonobstructing 7 x 3 mm calculus in the lower pole left kidney. No hydronephrosis or ureteral calculus on either side. Urinary bladder wall thickness normal.  5.  Uterus absent.  Electronically Signed   By: Lowella Grip III M.D.   On: 10/20/2020 10:02

## 2020-10-20 NOTE — Progress Notes (Signed)
Labs within normal limits from 10/19/20.

## 2020-10-20 NOTE — Telephone Encounter (Signed)
lft pt vm regarding Dr Burt Knack retiring. thanks

## 2020-10-20 NOTE — Telephone Encounter (Signed)
Patient was returning call about referral 

## 2020-10-20 NOTE — Progress Notes (Signed)
Cholelithiasis still seen, as well as a 7 mm polyp.  Radiology recommends repeat ultrasound in one year. Surgical referral has already been placed given her pain.  Keep surgical consult is advised as well a gastroenterology follow up.

## 2020-10-21 ENCOUNTER — Other Ambulatory Visit: Payer: Self-pay

## 2020-10-21 MED FILL — Pantoprazole Sodium EC Tab 40 MG (Base Equiv): ORAL | 90 days supply | Qty: 90 | Fill #0 | Status: AC

## 2020-10-22 ENCOUNTER — Ambulatory Visit: Payer: Self-pay | Admitting: Surgery

## 2020-10-26 ENCOUNTER — Encounter: Payer: Self-pay | Admitting: Surgery

## 2020-10-26 ENCOUNTER — Other Ambulatory Visit: Payer: Self-pay

## 2020-10-26 ENCOUNTER — Ambulatory Visit: Payer: No Typology Code available for payment source | Admitting: Surgery

## 2020-10-26 VITALS — BP 119/80 | HR 73 | Temp 98.3°F | Ht 62.0 in | Wt 216.0 lb

## 2020-10-26 DIAGNOSIS — K8020 Calculus of gallbladder without cholecystitis without obstruction: Secondary | ICD-10-CM | POA: Diagnosis not present

## 2020-10-26 DIAGNOSIS — K429 Umbilical hernia without obstruction or gangrene: Secondary | ICD-10-CM

## 2020-10-26 NOTE — Progress Notes (Signed)
10/26/2020  Reason for Visit:  Symptomatic cholelithiasis  Referring Provider:  Laverna Peace, FNP  History of Present Illness: Sarah [name] is a 50 y.o. female presenting for evaluation of symptomatic cholelithiasis.  The patient has a known history of cholelithiasis, and had a CT scan in 12/2019 which showed this.  The patient back then was having some RUQ abdominal discomfort, and now she reports that her pain has become more constant, with worsening at times when she eats.  She had a significant episode last week associated with RUQ and epigastric pain and nausea.  She reports that po intake would make the pain worse.  She saw her PCP on 4/25 and labs/images were ordered.  She had a CT scan and U/S.  I have personally viewed the images and agree with the findings.  She does have cholelithiasis, without signs of cholecystitis.  She also has a 7 mm gallbladder polyp which warrants follow up.  On her CT scan, she also has a small umbilical hernia which contains fat.  Her laboratory workup was overall unremarkable with normal WBC and normal LFTs.  Today, she denies any nausea but reports feeling still some discomfort in the RUQ with associated pressure, and this may feel worse if she's bending toward her right side.  Denies any jaundice or changes with her urine/stool color.  The patient reports that she has been on a better diet over the past 4 months and she has lost about 40 lbs.  She knows that this rapid weight loss can also be a factor with cholelithiasis.  She has had an abdominal hysterectomy in the past and C-sections.  She also has a history of left sided kidney stone and diverticulosis.  Past Medical History: Past Medical History:  Diagnosis Date  . Allergy   . Chicken pox   . Chronic pain   . GERD (gastroesophageal reflux disease)   . History of kidney stones   . Hyperlipidemia   . Shingles    age 20     Past Surgical History: Past Surgical History:  Procedure Laterality  Date  . ABDOMINAL HYSTERECTOMY  01/2001   DUB h/o abnormal pap ovaries intact age early 77s abnormal pap  . CESAREAN SECTION  12/1987  . CESAREAN SECTION  05/1993  . COLONOSCOPY WITH PROPOFOL N/A 01/29/2016   Procedure: COLONOSCOPY WITH PROPOFOL;  Surgeon: Manya Silvas, MD;  Location: [facility] ENDOSCOPY;  Service: Endoscopy;  Laterality: N/A;  . LITHOTRIPSY  2016    Home Medications: Prior to Admission medications   Medication Sig Start Date End Date Taking? Authorizing Provider  b complex vitamins tablet Take 1 tablet by mouth daily.   Yes [provider]  fluticasone (FLONASE) 50 MCG/ACT nasal spray Place into both nostrils daily.   Yes [provider]  loratadine (CLARITIN) 10 MG tablet Take 10 mg by mouth daily.   Yes [provider]  magnesium oxide (MAG-OX) 400 MG tablet Take 400 mg by mouth daily.   Yes [provider]  pantoprazole (PROTONIX) 40 MG tablet Take 1 tablet (40 mg total) by mouth daily. 30 minutes before 12/13/19  Yes McLean-Scocuzza, Nino Glow, MD    Allergies: Allergies  Allergen Reactions  . Aspirin     bleeding  . Sulfa Antibiotics Hives    Social History:  reports that she quit smoking about 10 years ago. Her smoking use included cigarettes. She has a 5.00 pack-year smoking history. She has never used smokeless tobacco. She reports current alcohol use.  She reports that she does not use drugs.   Family History: Family History  Problem Relation Age of Onset  . Breast cancer Mother 32  . Cancer Mother        breast  . Colon cancer Father 30  . Lung cancer Father   . Diabetes Father   . Hypertension Paternal Grandfather   . Breast cancer Maternal Grandmother        30's  . Cancer Maternal Grandmother        breast   . Cancer Cousin        breast     Review of Systems: Review of Systems  Constitutional: Positive for weight loss (intentional, on a healthy diet). Negative for chills and fever.  HENT: Negative for  hearing loss.   Respiratory: Negative for shortness of breath.   Cardiovascular: Negative for chest pain.  Gastrointestinal: Positive for abdominal pain and nausea. Negative for constipation, diarrhea and vomiting.  Genitourinary: Negative for dysuria.  Musculoskeletal: Negative for myalgias.  Skin: Negative for rash.  Neurological: Negative for dizziness.  Psychiatric/Behavioral: Negative for depression.    Physical Exam BP 119/80   Pulse 73   Temp 98.3 F (36.8 C)   Ht $R'5\' 2"'mW$  (1.575 m)   Wt 216 lb (98 kg)   SpO2 98%   BMI 39.51 kg/m  CONSTITUTIONAL: No acute distress HEENT:  Normocephalic, atraumatic, extraocular motion intact. NECK: Trachea is midline, and there is no jugular venous distension.  RESPIRATORY:  Lungs are clear, and breath sounds are equal bilaterally. Normal respiratory effort without pathologic use of accessory muscles. CARDIOVASCULAR: Heart is regular without murmurs, gallops, or rubs. GI: The abdomen is soft, non-distended, with some discomfort in the RUQ but negative Murphy's sign.  Patient has a small umbilical hernia, no palpable hernias in the epigastric area.  MUSCULOSKELETAL:  Normal muscle strength and tone in all four extremities.  No peripheral edema or cyanosis. SKIN: Skin turgor is normal. There are no pathologic skin lesions.  NEUROLOGIC:  Motor and sensation is grossly normal.  Cranial nerves are grossly intact. PSYCH:  Alert and oriented to person, place and time. Affect is normal.  Laboratory Analysis: Labs from 10/19/20: Na 139, K 4.5, Cl 102, CO2 31, BUN 21, Cr 0.86.  Total bili 0.4, AST 15, ALT 17, Alk Phos 71, lipase 41.  WBC 8.7, Hgb 13.5, Hct 39.6, Plt 237.  Imaging: CT abdomen/pelvis 10/20/20: IMPRESSION: 1. Cholelithiasis. Gallbladder wall does not appear thickened by CT. No biliary duct dilatation evident. 2. Sigmoid diverticula without diverticulitis. Mild wall thickening in the sigmoid colon may be due to muscular hypertrophy due to  chronic diverticulosis. 3. No bowel obstruction. No abscess in the abdomen or pelvis. Appendix appears normal. 4. Nonobstructing 7 x 3 mm calculus in the lower pole left kidney. No hydronephrosis or ureteral calculus on either side. Urinary bladder wall thickness normal. 5.  Uterus absent.  U/S RUQ 10/20/20: IMPRESSION: Cholelithiasis as well as a 7 mm polyp within the gallbladder. Per consensus guidelines, a polyp 7 mm in size warrants a follow-up ultrasound in 1 year to assess for stability. The presence of cholelithiasis potentially may warrant more aggressive surveillance depending on clinical symptoms. No gallbladder wall thickening or pericholecystic fluid.  Inhomogeneous liver echotexture may represent a degree of fatty infiltration, potentially with underlying parenchymal liver disease. No focal liver lesions evident.  Assessment and Plan: This is a 50 y.o. female with symptomatic cholelithiasis.  --Discussed with the patient the findings on her U/S, CT  scan, and labs.  Overall, she appears to have progressing cholelithiasis with worse biliary colic episodes.  She is interested in cholecystectomy and I think based on her symptoms, in addition to the gallbladder polyp, is very reasonable to proceed.  Discussed with her the role of robotic assisted cholecystectomy, discussed with her the surgery in length, including reviewing risks of bleeding, infection, and injury to surrounding structures.  We'll also use ICG for better biliary anatomy identification.  Discussed with her recovery time, post-op activity restrictions, and she's willing to proceed.  We would also repair the umbilical hernia at the same time.  Discussed with her that since we'll be working with the gallbladder, it would be better not to use any mesh for her hernia repair to avoid any potential risks of mesh infection. --Will schedule her for 11/12/20.  She has not been vaccinated for COVID-19 and thus understands that she  would need testing prior to surgery.  All questions answered.  Face-to-face time spent with the patient and care providers was 80 minutes, with more than 50% of the time spent counseling, educating, and coordinating care of the patient.     Melvyn Neth, [facility]

## 2020-10-26 NOTE — H&P (View-Only) (Signed)
10/26/2020  Reason for Visit:  Symptomatic cholelithiasis  Referring Provider:  Laverna Peace, FNP  History of Present Illness: Sarah [name] is a 50 y.o. female presenting for evaluation of symptomatic cholelithiasis.  The patient has a known history of cholelithiasis, and had a CT scan in 12/2019 which showed this.  The patient back then was having some RUQ abdominal discomfort, and now she reports that her pain has become more constant, with worsening at times when she eats.  She had a significant episode last week associated with RUQ and epigastric pain and nausea.  She reports that po intake would make the pain worse.  She saw her PCP on 4/25 and labs/images were ordered.  She had a CT scan and U/S.  I have personally viewed the images and agree with the findings.  She does have cholelithiasis, without signs of cholecystitis.  She also has a 7 mm gallbladder polyp which warrants follow up.  On her CT scan, she also has a small umbilical hernia which contains fat.  Her laboratory workup was overall unremarkable with normal WBC and normal LFTs.  Today, she denies any nausea but reports feeling still some discomfort in the RUQ with associated pressure, and this may feel worse if she's bending toward her right side.  Denies any jaundice or changes with her urine/stool color.  The patient reports that she has been on a better diet over the past 4 months and she has lost about 40 lbs.  She knows that this rapid weight loss can also be a factor with cholelithiasis.  She has had an abdominal hysterectomy in the past and C-sections.  She also has a history of left sided kidney stone and diverticulosis.  Past Medical History: Past Medical History:  Diagnosis Date  . Allergy   . Chicken pox   . Chronic pain   . GERD (gastroesophageal reflux disease)   . History of kidney stones   . Hyperlipidemia   . Shingles    age 41     Past Surgical History: Past Surgical History:  Procedure Laterality  Date  . ABDOMINAL HYSTERECTOMY  01/2001   DUB h/o abnormal pap ovaries intact age early 89s abnormal pap  . CESAREAN SECTION  12/1987  . CESAREAN SECTION  05/1993  . COLONOSCOPY WITH PROPOFOL N/A 01/29/2016   Procedure: COLONOSCOPY WITH PROPOFOL;  Surgeon: Manya Silvas, MD;  Location[facility] ENDOSCOPY;  Service: Endoscopy;  Laterality: N/A;  . LITHOTRIPSY  2016    Home Medications: Prior to Admission medications   Medication Sig Start Date End Date Taking? Authorizing Provider  b complex vitamins tablet Take 1 tablet by mouth daily.   Yes [provider]  fluticasone (FLONASE) 50 MCG/ACT nasal spray Place into both nostrils daily.   Yes [provider]  loratadine (CLARITIN) 10 MG tablet Take 10 mg by mouth daily.   Yes [provider]  magnesium oxide (MAG-OX) 400 MG tablet Take 400 mg by mouth daily.   Yes [provider]  pantoprazole (PROTONIX) 40 MG tablet Take 1 tablet (40 mg total) by mouth daily. 30 minutes before 12/13/19  Yes McLean-Scocuzza, Nino Glow, MD    Allergies: Allergies  Allergen Reactions  . Aspirin     bleeding  . Sulfa Antibiotics Hives    Social History:  reports that she quit smoking about 10 years ago. Her smoking use included cigarettes. She has a 5.00 pack-year smoking history. She has never used smokeless tobacco. She reports current alcohol use.  She reports that she does not use drugs.   Family History: Family History  Problem Relation Age of Onset  . Breast cancer Mother 53  . Cancer Mother        breast  . Colon cancer Father 30  . Lung cancer Father   . Diabetes Father   . Hypertension Paternal Grandfather   . Breast cancer Maternal Grandmother        30's  . Cancer Maternal Grandmother        breast   . Cancer Cousin        breast     Review of Systems: Review of Systems  Constitutional: Positive for weight loss (intentional, on a healthy diet). Negative for chills and fever.  HENT: Negative for  hearing loss.   Respiratory: Negative for shortness of breath.   Cardiovascular: Negative for chest pain.  Gastrointestinal: Positive for abdominal pain and nausea. Negative for constipation, diarrhea and vomiting.  Genitourinary: Negative for dysuria.  Musculoskeletal: Negative for myalgias.  Skin: Negative for rash.  Neurological: Negative for dizziness.  Psychiatric/Behavioral: Negative for depression.    Physical Exam BP 119/80   Pulse 73   Temp 98.3 F (36.8 C)   Ht $R'5\' 2"'mq$  (1.575 m)   Wt 216 lb (98 kg)   SpO2 98%   BMI 39.51 kg/m  CONSTITUTIONAL: No acute distress HEENT:  Normocephalic, atraumatic, extraocular motion intact. NECK: Trachea is midline, and there is no jugular venous distension.  RESPIRATORY:  Lungs are clear, and breath sounds are equal bilaterally. Normal respiratory effort without pathologic use of accessory muscles. CARDIOVASCULAR: Heart is regular without murmurs, gallops, or rubs. GI: The abdomen is soft, non-distended, with some discomfort in the RUQ but negative Murphy's sign.  Patient has a small umbilical hernia, no palpable hernias in the epigastric area.  MUSCULOSKELETAL:  Normal muscle strength and tone in all four extremities.  No peripheral edema or cyanosis. SKIN: Skin turgor is normal. There are no pathologic skin lesions.  NEUROLOGIC:  Motor and sensation is grossly normal.  Cranial nerves are grossly intact. PSYCH:  Alert and oriented to person, place and time. Affect is normal.  Laboratory Analysis: Labs from 10/19/20: Na 139, K 4.5, Cl 102, CO2 31, BUN 21, Cr 0.86.  Total bili 0.4, AST 15, ALT 17, Alk Phos 71, lipase 41.  WBC 8.7, Hgb 13.5, Hct 39.6, Plt 237.  Imaging: CT abdomen/pelvis 10/20/20: IMPRESSION: 1. Cholelithiasis. Gallbladder wall does not appear thickened by CT. No biliary duct dilatation evident. 2. Sigmoid diverticula without diverticulitis. Mild wall thickening in the sigmoid colon may be due to muscular hypertrophy due to  chronic diverticulosis. 3. No bowel obstruction. No abscess in the abdomen or pelvis. Appendix appears normal. 4. Nonobstructing 7 x 3 mm calculus in the lower pole left kidney. No hydronephrosis or ureteral calculus on either side. Urinary bladder wall thickness normal. 5.  Uterus absent.  U/S RUQ 10/20/20: IMPRESSION: Cholelithiasis as well as a 7 mm polyp within the gallbladder. Per consensus guidelines, a polyp 7 mm in size warrants a follow-up ultrasound in 1 year to assess for stability. The presence of cholelithiasis potentially may warrant more aggressive surveillance depending on clinical symptoms. No gallbladder wall thickening or pericholecystic fluid.  Inhomogeneous liver echotexture may represent a degree of fatty infiltration, potentially with underlying parenchymal liver disease. No focal liver lesions evident.  Assessment and Plan: This is a 50 y.o. female with symptomatic cholelithiasis.  --Discussed with the patient the findings on her U/S, CT  scan, and labs.  Overall, she appears to have progressing cholelithiasis with worse biliary colic episodes.  She is interested in cholecystectomy and I think based on her symptoms, in addition to the gallbladder polyp, is very reasonable to proceed.  Discussed with her the role of robotic assisted cholecystectomy, discussed with her the surgery in length, including reviewing risks of bleeding, infection, and injury to surrounding structures.  We'll also use ICG for better biliary anatomy identification.  Discussed with her recovery time, post-op activity restrictions, and she's willing to proceed.  We would also repair the umbilical hernia at the same time.  Discussed with her that since we'll be working with the gallbladder, it would be better not to use any mesh for her hernia repair to avoid any potential risks of mesh infection. --Will schedule her for 11/12/20.  She has not been vaccinated for COVID-19 and thus understands that she  would need testing prior to surgery.  All questions answered.  Face-to-face time spent with the patient and care providers was 80 minutes, with more than 50% of the time spent counseling, educating, and coordinating care of the patient.     Melvyn Neth, [facility]

## 2020-10-26 NOTE — Patient Instructions (Signed)
Our surgery scheduler Pamala Hurry will call you within 24-48 hours to get you scheduled. If you have not heard from her after 48 hours, please call our office. You will need to get Covid tested before surgery and have the blue sheet available when she calls to write down important information.  If you have any concerns or questions, please feel free to call our office.      Cholelithiasis  Cholelithiasis is a disease in which gallstones form in the gallbladder. The gallbladder is an organ that stores bile. Bile is a fluid that helps to digest fats. Gallstones begin as small crystals and can slowly grow into stones. They may cause no symptoms until they block the gallbladder duct, or cystic duct, when the gallbladder tightens (contracts) after food is eaten. This can cause pain and is known as a gallbladder attack, or biliary colic. There are two main types of gallstones:  Cholesterol stones. These are the most common type of gallstone. These stones are made of hardened cholesterol and are usually yellow-green in color. Cholesterol is a fat-like substance that is made in the liver.  Pigment stones. These are dark in color and are made of a red-yellow substance, called bilirubin,that forms when hemoglobin from red blood cells breaks down. What are the causes? This condition may be caused by an imbalance in the different parts that make bile. This can happen if the bile:  Has too much bilirubin. This can happen in certain blood diseases, such as sickle cell anemia.  Has too much cholesterol.  Does not have enough bile salts. These salts help the body absorb and digest fats. In some cases, this condition can also be caused by the gallbladder not emptying completely or often enough. This is common during pregnancy. What increases the risk? The following factors may make you more likely to develop this condition:  Being female.  Having multiple pregnancies. Health care providers sometimes advise  removing diseased gallbladders before future pregnancies.  Eating a diet that is heavy in fried foods, fat, and refined carbohydrates, such as white bread and white rice.  Being obese.  Being older than age 50.  Using medicines that contain female hormones (estrogen) for a long time.  Losing weight quickly.  Having a family history of gallstones.  Having certain medical problems, such as: ? Diabetes mellitus. ? Cystic fibrosis. ? Crohn's disease. ? Cirrhosis or other long-term (chronic) liver disease. ? Certain blood diseases, such as sickle cell anemia or leukemia. What are the signs or symptoms? In many cases, having gallstones causes no symptoms. When you have gallstones but do not have symptoms, you have silent gallstones. If a gallstone blocks your bile duct, it can cause a gallbladder attack. The main symptom of a gallbladder attack is sudden pain in the upper right part of the abdomen. The pain:  Usually comes at night or after eating.  Can last for one hour or more.  Can spread to your right shoulder, back, or chest.  Can feel like indigestion. This is discomfort, burning, or fullness in your upper abdomen. If the bile duct is blocked for more than a few hours, it can cause an infection or inflammation of your gallbladder (cholecystitis), liver, or pancreas. This can cause:  Nausea or vomiting.  Bloating.  Pain in your abdomen that lasts for 5 hours or longer.  Tenderness in your upper abdomen, often in the upper right section and under your rib cage.  Fever or chills.  Skin or the white  parts of your eyes turning yellow (jaundice). This usually happens when a stone has blocked bile from passing through the common bile duct.  Dark urine or light-colored stools. How is this diagnosed? This condition may be diagnosed based on:  A physical exam.  Your medical history.  Ultrasound.  CT scan.  MRI. You may also have other tests, including:  Blood tests to  check for signs of an infection or inflammation.  Cholescintigraphy, or HIDA scan. This is a scan of your gallbladder and bile ducts (biliary system) using non-harmful radioactive material and special cameras that can see the radioactive material.  Endoscopic retrograde cholangiopancreatogram. This involves inserting a small tube with a camera on the end (endoscope) through your mouth to look at bile ducts and check for blockages. How is this treated? Treatment for this condition depends on the severity of the condition. Silent gallstones do not need treatment. Treatment may be needed if a blockage causes a gallbladder attack or other symptoms. Treatment may include:  Home care, if symptoms are not severe. ? During a simple gallbladder attack, stop eating and drinking for 12-24 hours (except for water and clear liquids). This helps to "cool down" your gallbladder. After 1 or 2 days, you can start to eat a diet of simple or clear foods, such as broths and crackers. ? You may also need medicines for pain or nausea or both. ? If you have cholecystitis and an infection, you will need antibiotics.  A hospital stay, if needed for pain control or for cholecystitis with severe infection.  Cholecystectomy, or surgery to remove your gallbladder. This is the most common treatment if all other treatments have not worked.  Medicines to break up gallstones. These are most effective at treating small gallstones. Medicines may be used for up to 6-12 months.  Endoscopic retrograde cholangiopancreatogram. A small basket can be attached to the endoscope and used to capture and remove gallstones, mainly those that are in the common bile duct. Follow these instructions at home: Medicines  Take over-the-counter and prescription medicines only as told by your health care provider.  If you were prescribed an antibiotic medicine, take it as told by your health care provider. Do not stop taking the antibiotic even if  you start to feel better.  Ask your health care provider if the medicine prescribed to you requires you to avoid driving or using machinery. Eating and drinking  Drink enough fluid to keep your urine pale yellow. This is important during a gallbladder attack. Water and clear liquids are preferred.  Follow a healthy diet. This includes: ? Reducing fatty foods, such as fried food and foods high in cholesterol. ? Reducing refined carbohydrates, such as white bread and white rice. ? Eating more fiber. Aim for foods such as almonds, fruit, and beans. Alcohol use  If you drink alcohol: ? Limit how much you use to:  0-1 drink a day for nonpregnant women.  0-2 drinks a day for men. ? Be aware of how much alcohol is in your drink. In the [place] one drink equals one 12 oz bottle of beer (355 mL), one 5 oz glass of wine (148 mL), or one 1 oz glass of hard liquor (44 mL). General instructions  Do not use any products that contain nicotine or tobacco, such as cigarettes, e-cigarettes, and chewing tobacco. If you need help quitting, ask your health care provider.  Maintain a healthy weight.  Keep all follow-up visits as told by your health care provider.  These may include consultations with a surgeon or specialist. This is important. Where to find more information  [employer] [facility] Diabetes and Digestive and Kidney Diseases: [email or web address] Contact a health care provider if:  You think you have had a gallbladder attack.  You have been diagnosed with silent gallstones and you develop pain in your abdomen or indigestion.  You begin to have attacks more often.  You have dark urine or light-colored stools. Get help right away if:  You have pain from a gallbladder attack that lasts for more than 2 hours.  You have pain in your abdomen that lasts for more than 5 hours or is getting worse.  You have a fever or chills.  You have nausea and vomiting that do not go away.  You develop  jaundice. Summary  Cholelithiasis is a disease in which gallstones form in the gallbladder.  This condition may be caused by an imbalance in the different parts that make bile. This can happen if your bile has too much bilirubin or cholesterol, or does not have enough bile salts.  Treatment for gallstones depends on the severity of the condition. Silent gallstones do not need treatment.  If gallstones cause a gallbladder attack or other symptoms, treatment usually involves not eating or drinking anything. Treatment may also include pain medicines and antibiotics, and it sometimes includes a hospital stay.  Surgery to remove the gallbladder is common if all other treatments have not worked. This information is not intended to replace advice given to you by your health care provider. Make sure you discuss any questions you have with your health care provider. Document Revised: 05/06/2019 Document Reviewed: 05/06/2019 Elsevier Patient Education  2021 [employer].

## 2020-10-27 ENCOUNTER — Telehealth: Payer: Self-pay | Admitting: Surgery

## 2020-10-27 NOTE — Telephone Encounter (Signed)
Incoming call from patient.  She is now informed of all dates regarding her surgery and verbalized understanding.

## 2020-10-27 NOTE — Telephone Encounter (Signed)
Outgoing call is made, left message for patient to call.  Please advise patient of Pre-Admission date/time, COVID Testing date and Surgery date.  Surgery Date: 11/12/20 Preadmission Testing Date: 11/05/20 (phone 8a-1p) Covid Testing Date: 11/10/20 @ 9:00 am.  Patient advised to go to the [facility] ([place]    Also patient to call at [phone], between 1-3:00pm the day before surgery, to find out what time to arrive for surgery.

## 2020-11-05 ENCOUNTER — Other Ambulatory Visit: Payer: Self-pay

## 2020-11-05 ENCOUNTER — Other Ambulatory Visit
Admission: RE | Admit: 2020-11-05 | Discharge: 2020-11-05 | Disposition: A | Discharge status: Home / Self Care | Payer: No Typology Code available for payment source | Source: Ambulatory Visit | Attending: Surgery | Admitting: Surgery

## 2020-11-05 HISTORY — DX: Sleep apnea, unspecified: G47.30

## 2020-11-05 HISTORY — DX: Other specified postprocedural states: Z98.890

## 2020-11-05 HISTORY — DX: Nausea with vomiting, unspecified: R11.2

## 2020-11-05 NOTE — Patient Instructions (Addendum)
Your procedure is scheduled on: Thursday Nov 12, 2020. Report to Day Surgery inside [facility] 2nd floor (stop by admissions desk first before getting on elevator). To find out your arrival time please call [phone] between 1PM - 3PM on Wednesday Nov 11, 2020.  Remember: Instructions that are not followed completely may result in serious medical risk,  up to and including death, or upon the discretion of your surgeon and anesthesiologist your  surgery may need to be rescheduled.     _X__ 1. Do not eat food after midnight the night before your procedure.                 No chewing gum or hard candies. You may drink clear liquids up to 2 hours                 before you are scheduled to arrive for your surgery- DO not drink clear                 liquids within 2 hours of the start of your surgery.                 Clear Liquids include:  water, apple juice without pulp, clear Gatorade, G2 or                  Gatorade Zero (avoid Red/Purple/Blue), Black Coffee or Tea (Do not add                 anything to coffee or tea).  __X__2.  On the morning of surgery brush your teeth with toothpaste and water, you                may rinse your mouth with mouthwash if you wish.  Do not swallow any toothpaste of mouthwash.     _X__ 3.  No Alcohol for 24 hours before or after surgery.   _X__ 4.  Do Not Smoke or use e-cigarettes For 24 Hours Prior to Your Surgery.                 Do not use any chewable tobacco products for at least 6 hours prior to                 Surgery.  _X__  5.  Do not use any recreational drugs (marijuana, cocaine, heroin, ecstasy, MDMA or other)                For at least one week prior to your surgery.  Combination of these drugs with anesthesia                May have life threatening results.  __X__ 6.  Notify your doctor if there is any change in your medical condition      (cold, fever, infections).     Do not wear jewelry, make-up,  hairpins, clips or nail polish. Do not wear lotions, powders, perfumes or deodorants. Do not shave 48 hours prior to surgery. Men may shave face and neck. Do not bring valuables to the hospital.    [facility] is not responsible for any belongings or valuables.  Contacts, dentures or bridgework may not be worn into surgery. Leave your suitcase in the car. After surgery it may be brought to your room. For patients admitted to the hospital, discharge time is determined by your treatment team.   Patients discharged the day of surgery will not be allowed to drive home.  Make arrangements for someone to be with you for the first 24 hours of your Same Day Discharge.   __X__ Take these medicines the morning of surgery with A SIP OF WATER:    1. pantoprazole (PROTONIX) 40 MG  2. loratadine (CLARITIN) 10 MG   3.   4.  5.  6.  ____ Fleet Enema (as directed)   __X__ Use CHG Soap (or wipes) as directed  ____ Use Benzoyl Peroxide Gel as instructed  __X__ Use inhalers on the day of surgery  fluticasone (FLONASE) 50 MCG/ACT   ____ Stop metformin 2 days prior to surgery    ____ Take 1/2 of usual insulin dose the night before surgery. No insulin the morning          of surgery.   ____ Call your PCP, cardiologist, or Pulmonologist if taking Coumadin/Plavix/aspirin and ask when to stop before your surgery.   __X__ One Week prior to surgery- Stop Anti-inflammatories such as Ibuprofen, Aleve, Advil, Motrin, meloxicam (MOBIC), diclofenac, etodolac, ketorolac, Toradol, Daypro, piroxicam, Goody's or BC powders. OK TO USE TYLENOL IF NEEDED   __X__ Stop supplements until after surgery.    ____ Bring C-Pap to the hospital.    If you have any questions regarding your pre-procedure instructions,  Please call Pre-admit Testing at [phone].

## 2020-11-10 ENCOUNTER — Other Ambulatory Visit: Admission: RE | Admit: 2020-11-10 | Payer: No Typology Code available for payment source | Source: Ambulatory Visit

## 2020-11-12 ENCOUNTER — Encounter: Payer: Self-pay | Admitting: Surgery

## 2020-11-12 ENCOUNTER — Encounter
Admission: RE | Disposition: A | Discharge status: Home / Self Care | Payer: Self-pay | Source: Home / Self Care | Attending: Surgery

## 2020-11-12 ENCOUNTER — Other Ambulatory Visit: Payer: Self-pay

## 2020-11-12 ENCOUNTER — Ambulatory Visit
Admission: RE | Admit: 2020-11-12 | Discharge: 2020-11-12 | Disposition: A | Discharge status: Home / Self Care | Payer: No Typology Code available for payment source | Attending: Surgery | Admitting: Surgery

## 2020-11-12 ENCOUNTER — Ambulatory Visit: Payer: No Typology Code available for payment source | Admitting: Anesthesiology

## 2020-11-12 DIAGNOSIS — Z28310 Unvaccinated for covid-19: Secondary | ICD-10-CM | POA: Diagnosis not present

## 2020-11-12 DIAGNOSIS — K828 Other specified diseases of gallbladder: Secondary | ICD-10-CM

## 2020-11-12 DIAGNOSIS — Z87891 Personal history of nicotine dependence: Secondary | ICD-10-CM | POA: Insufficient documentation

## 2020-11-12 DIAGNOSIS — Z79899 Other long term (current) drug therapy: Secondary | ICD-10-CM | POA: Insufficient documentation

## 2020-11-12 DIAGNOSIS — Z882 Allergy status to sulfonamides status: Secondary | ICD-10-CM | POA: Diagnosis not present

## 2020-11-12 DIAGNOSIS — K429 Umbilical hernia without obstruction or gangrene: Secondary | ICD-10-CM | POA: Diagnosis not present

## 2020-11-12 DIAGNOSIS — K8020 Calculus of gallbladder without cholecystitis without obstruction: Secondary | ICD-10-CM

## 2020-11-12 DIAGNOSIS — K8010 Calculus of gallbladder with chronic cholecystitis without obstruction: Secondary | ICD-10-CM | POA: Insufficient documentation

## 2020-11-12 HISTORY — PX: UMBILICAL HERNIA REPAIR: SHX196

## 2020-11-12 SURGERY — CHOLECYSTECTOMY, ROBOT-ASSISTED, LAPAROSCOPIC
Anesthesia: General

## 2020-11-12 MED ORDER — KETOROLAC TROMETHAMINE 30 MG/ML IJ SOLN
INTRAMUSCULAR | Status: DC | PRN
  Administered 2020-11-12: 30 mg via INTRAVENOUS

## 2020-11-12 MED ORDER — ACETAMINOPHEN 500 MG PO TABS: 1000.0000 mg | ORAL_TABLET | ORAL | Status: AC

## 2020-11-12 MED ORDER — SUGAMMADEX SODIUM 200 MG/2ML IV SOLN
INTRAVENOUS | Status: DC | PRN
  Administered 2020-11-12: 400 mg via INTRAVENOUS

## 2020-11-12 MED ORDER — LACTATED RINGERS IV SOLN: INTRAVENOUS | Status: DC | PRN

## 2020-11-12 MED ORDER — FENTANYL CITRATE (PF) 250 MCG/5ML IJ SOLN
INTRAMUSCULAR | Status: AC
  Filled 2020-11-12: qty 5

## 2020-11-12 MED ORDER — MIDAZOLAM HCL 2 MG/2ML IJ SOLN
INTRAMUSCULAR | Status: DC | PRN
  Administered 2020-11-12: 2 mg via INTRAVENOUS

## 2020-11-12 MED ORDER — FENTANYL CITRATE (PF) 100 MCG/2ML IJ SOLN
INTRAMUSCULAR | Status: DC | PRN
  Administered 2020-11-12: 50 ug via INTRAVENOUS
  Administered 2020-11-12: 25 ug via INTRAVENOUS
  Administered 2020-11-12: 50 ug via INTRAVENOUS
  Administered 2020-11-12: 25 ug via INTRAVENOUS

## 2020-11-12 MED ORDER — BUPIVACAINE-EPINEPHRINE (PF) 0.5% -1:200000 IJ SOLN
INTRAMUSCULAR | Status: DC | PRN
  Administered 2020-11-12: 30 mL

## 2020-11-12 MED ORDER — ORAL CARE MOUTH RINSE: 15.0000 mL | Freq: Once | OROMUCOSAL | Status: AC

## 2020-11-12 MED ORDER — CHLORHEXIDINE GLUCONATE CLOTH 2 % EX PADS: 6.0000 | MEDICATED_PAD | Freq: Once | CUTANEOUS | Status: DC

## 2020-11-12 MED ORDER — ACETAMINOPHEN 500 MG PO TABS: 1000.0000 mg | ORAL_TABLET | Freq: Four times a day (QID) | ORAL | Status: DC | PRN

## 2020-11-12 MED ORDER — OXYCODONE HCL 5 MG PO TABS
5.0000 mg | ORAL_TABLET | Freq: Once | ORAL | Status: AC
Start: 2020-11-12 — End: 2020-11-12
  Administered 2020-11-12: 5 mg via ORAL

## 2020-11-12 MED ORDER — CEFAZOLIN SODIUM-DEXTROSE 2-4 GM/100ML-% IV SOLN
INTRAVENOUS | Status: AC
  Filled 2020-11-12: qty 100

## 2020-11-12 MED ORDER — ONDANSETRON HCL 4 MG/2ML IJ SOLN
INTRAMUSCULAR | Status: DC | PRN
  Administered 2020-11-12: 4 mg via INTRAVENOUS

## 2020-11-12 MED ORDER — GLYCOPYRROLATE 0.2 MG/ML IJ SOLN
INTRAMUSCULAR | Status: DC | PRN
  Administered 2020-11-12: .2 mg via INTRAVENOUS

## 2020-11-12 MED ORDER — CHLORHEXIDINE GLUCONATE 0.12 % MT SOLN: 15.0000 mL | Freq: Once | OROMUCOSAL | Status: AC

## 2020-11-12 MED ORDER — ACETAMINOPHEN 500 MG PO TABS
ORAL_TABLET | ORAL | Status: AC
  Administered 2020-11-12: 1000 mg via ORAL
  Filled 2020-11-12: qty 2

## 2020-11-12 MED ORDER — MIDAZOLAM HCL 2 MG/2ML IJ SOLN
INTRAMUSCULAR | Status: AC
  Filled 2020-11-12: qty 2

## 2020-11-12 MED ORDER — DEXAMETHASONE SODIUM PHOSPHATE 10 MG/ML IJ SOLN
INTRAMUSCULAR | Status: DC | PRN
  Administered 2020-11-12: 10 mg via INTRAVENOUS

## 2020-11-12 MED ORDER — CHLORHEXIDINE GLUCONATE 0.12 % MT SOLN
OROMUCOSAL | Status: AC
  Administered 2020-11-12: 15 mL via OROMUCOSAL
  Filled 2020-11-12: qty 15

## 2020-11-12 MED ORDER — BUPIVACAINE-EPINEPHRINE (PF) 0.25% -1:200000 IJ SOLN
INTRAMUSCULAR | Status: AC
  Filled 2020-11-12: qty 30

## 2020-11-12 MED ORDER — CEFAZOLIN SODIUM-DEXTROSE 2-4 GM/100ML-% IV SOLN
2.0000 g | INTRAVENOUS | Status: AC
  Administered 2020-11-12: 2 g via INTRAVENOUS

## 2020-11-12 MED ORDER — ROCURONIUM BROMIDE 100 MG/10ML IV SOLN
INTRAVENOUS | Status: DC | PRN
  Administered 2020-11-12 (×2): 20 mg via INTRAVENOUS
  Administered 2020-11-12: 50 mg via INTRAVENOUS

## 2020-11-12 MED ORDER — OXYCODONE HCL 5 MG PO TABS
ORAL_TABLET | ORAL | Status: AC
  Filled 2020-11-12: qty 1

## 2020-11-12 MED ORDER — LIDOCAINE HCL (CARDIAC) PF 100 MG/5ML IV SOSY
PREFILLED_SYRINGE | INTRAVENOUS | Status: DC | PRN
  Administered 2020-11-12: 100 mg via INTRAVENOUS

## 2020-11-12 MED ORDER — BUPIVACAINE-EPINEPHRINE (PF) 0.5% -1:200000 IJ SOLN
INTRAMUSCULAR | Status: AC
  Filled 2020-11-12: qty 30

## 2020-11-12 MED ORDER — LIDOCAINE HCL (PF) 2 % IJ SOLN
INTRAMUSCULAR | Status: AC
  Filled 2020-11-12: qty 5

## 2020-11-12 MED ORDER — DEXMEDETOMIDINE (PRECEDEX) IN NS 20 MCG/5ML (4 MCG/ML) IV SYRINGE
PREFILLED_SYRINGE | INTRAVENOUS | Status: DC | PRN
  Administered 2020-11-12 (×3): 4 ug via INTRAVENOUS

## 2020-11-12 MED ORDER — GABAPENTIN 300 MG PO CAPS
ORAL_CAPSULE | ORAL | Status: AC
  Administered 2020-11-12: 300 mg via ORAL
  Filled 2020-11-12: qty 1

## 2020-11-12 MED ORDER — PROPOFOL 10 MG/ML IV BOLUS
INTRAVENOUS | Status: DC | PRN
  Administered 2020-11-12: 40 mg via INTRAVENOUS
  Administered 2020-11-12: 160 mg via INTRAVENOUS

## 2020-11-12 MED ORDER — ONDANSETRON HCL 4 MG/2ML IJ SOLN: 4.0000 mg | Freq: Once | INTRAMUSCULAR | Status: DC | PRN

## 2020-11-12 MED ORDER — FENTANYL CITRATE (PF) 100 MCG/2ML IJ SOLN
25.0000 ug | INTRAMUSCULAR | Status: DC | PRN
  Administered 2020-11-12: 25 ug via INTRAVENOUS

## 2020-11-12 MED ORDER — OXYCODONE HCL 5 MG PO TABS
5.0000 mg | ORAL_TABLET | ORAL | 0 refills | Status: DC | PRN
  Filled 2020-11-12: qty 30, 5d supply, fill #0

## 2020-11-12 MED ORDER — GABAPENTIN 300 MG PO CAPS
300.0000 mg | ORAL_CAPSULE | ORAL | Status: AC
Start: 2020-11-12 — End: 2020-11-12

## 2020-11-12 MED ORDER — IBUPROFEN 600 MG PO TABS
600.0000 mg | ORAL_TABLET | Freq: Three times a day (TID) | ORAL | 1 refills | Status: DC | PRN
  Filled 2020-11-12: qty 60, 20d supply, fill #0

## 2020-11-12 MED ORDER — LACTATED RINGERS IV SOLN: INTRAVENOUS | Status: DC

## 2020-11-12 MED ORDER — FAMOTIDINE 20 MG PO TABS
ORAL_TABLET | ORAL | Status: AC
  Administered 2020-11-12: 20 mg
  Filled 2020-11-12: qty 1

## 2020-11-12 MED ORDER — FENTANYL CITRATE (PF) 100 MCG/2ML IJ SOLN
INTRAMUSCULAR | Status: AC
  Administered 2020-11-12: 25 ug via INTRAVENOUS
  Filled 2020-11-12: qty 2

## 2020-11-12 MED ORDER — PROPOFOL 10 MG/ML IV BOLUS
INTRAVENOUS | Status: AC
  Filled 2020-11-12: qty 20

## 2020-11-12 MED ORDER — INDOCYANINE GREEN 25 MG IV SOLR
2.5000 mg | INTRAVENOUS | Status: AC
Start: 2020-11-12 — End: 2020-11-12
  Administered 2020-11-12: 2.5 mg via INTRAVENOUS
  Filled 2020-11-12: qty 1

## 2020-11-12 SURGICAL SUPPLY — 67 items
ADH SKN CLS APL DERMABOND .7 (GAUZE/BANDAGES/DRESSINGS) ×1
APL PRP STRL LF DISP 70% ISPRP (MISCELLANEOUS) ×1
BAG INFUSER PRESSURE 100CC (MISCELLANEOUS) ×2 IMPLANT
BAG SPEC RTRVL LRG 6X4 10 (ENDOMECHANICALS) ×1
BLADE SURG 15 STRL LF DISP TIS (BLADE) IMPLANT
BLADE SURG 15 STRL SS (BLADE)
CANISTER SUCT 1200ML W/VALVE (MISCELLANEOUS) ×2 IMPLANT
CANNULA REDUC XI 12-8 STAPL (CANNULA) ×1
CANNULA REDUCER 12-8 DVNC XI (CANNULA) ×1 IMPLANT
CHLORAPREP W/TINT 26 (MISCELLANEOUS) ×2 IMPLANT
CLIP VESOLOCK MED LG 6/CT (CLIP) ×2 IMPLANT
COVER WAND RF STERILE (DRAPES) ×2 IMPLANT
CUP MEDICINE 2OZ PLAST GRAD ST (MISCELLANEOUS) ×2 IMPLANT
DECANTER SPIKE VIAL GLASS SM (MISCELLANEOUS) ×2 IMPLANT
DEFOGGER SCOPE WARMER CLEARIFY (MISCELLANEOUS) ×2 IMPLANT
DERMABOND ADVANCED (GAUZE/BANDAGES/DRESSINGS) ×1
DERMABOND ADVANCED .7 DNX12 (GAUZE/BANDAGES/DRESSINGS) ×1 IMPLANT
DRAPE ARM DVNC X/XI (DISPOSABLE) ×4 IMPLANT
DRAPE COLUMN DVNC XI (DISPOSABLE) ×1 IMPLANT
DRAPE DA VINCI XI ARM (DISPOSABLE) ×4
DRAPE DA VINCI XI COLUMN (DISPOSABLE) ×1
DRAPE LAPAROTOMY 77X122 PED (DRAPES) IMPLANT
ELECT CAUTERY BLADE TIP 2.5 (TIP) ×2
ELECT REM PT RETURN 9FT ADLT (ELECTROSURGICAL) ×2
ELECTRODE CAUTERY BLDE TIP 2.5 (TIP) ×1 IMPLANT
ELECTRODE REM PT RTRN 9FT ADLT (ELECTROSURGICAL) ×1 IMPLANT
GLOVE SURG SYN 7.0 (GLOVE) ×6 IMPLANT
GLOVE SURG SYN 7.5  E (GLOVE) ×3
GLOVE SURG SYN 7.5 E (GLOVE) ×3 IMPLANT
GOWN STRL REUS W/ TWL LRG LVL3 (GOWN DISPOSABLE) ×4 IMPLANT
GOWN STRL REUS W/TWL LRG LVL3 (GOWN DISPOSABLE) ×8
IRRIGATOR SUCT 8 DISP DVNC XI (IRRIGATION / IRRIGATOR) ×1 IMPLANT
IRRIGATOR SUCTION 8MM XI DISP (IRRIGATION / IRRIGATOR) ×1
IV NS 1000ML (IV SOLUTION) ×2
IV NS 1000ML BAXH (IV SOLUTION) ×1 IMPLANT
KIT PINK PAD W/HEAD ARE REST (MISCELLANEOUS) ×2
KIT PINK PAD W/HEAD ARM REST (MISCELLANEOUS) ×1 IMPLANT
LABEL OR SOLS (LABEL) ×2 IMPLANT
MANIFOLD NEPTUNE II (INSTRUMENTS) ×2 IMPLANT
NEEDLE HYPO 22GX1.5 SAFETY (NEEDLE) ×2 IMPLANT
NS IRRIG 500ML POUR BTL (IV SOLUTION) ×2 IMPLANT
OBTURATOR OPTICAL STANDARD 8MM (TROCAR) ×1
OBTURATOR OPTICAL STND 8 DVNC (TROCAR) ×1
OBTURATOR OPTICALSTD 8 DVNC (TROCAR) ×1 IMPLANT
PACK BASIN MINOR ARMC (MISCELLANEOUS) IMPLANT
PACK LAP CHOLECYSTECTOMY (MISCELLANEOUS) ×2 IMPLANT
PENCIL ELECTRO HAND CTR (MISCELLANEOUS) ×2 IMPLANT
POUCH SPECIMEN RETRIEVAL 10MM (ENDOMECHANICALS) ×2 IMPLANT
SEAL CANN UNIV 5-8 DVNC XI (MISCELLANEOUS) ×4 IMPLANT
SEAL XI 5MM-8MM UNIVERSAL (MISCELLANEOUS) ×4
SET TUBE SMOKE EVAC HIGH FLOW (TUBING) ×2 IMPLANT
SOLUTION ELECTROLUBE (MISCELLANEOUS) ×2 IMPLANT
SPONGE LAP 18X18 RF (DISPOSABLE) IMPLANT
SPONGE LAP 4X18 RFD (DISPOSABLE) ×2 IMPLANT
STAPLER CANNULA SEAL DVNC XI (STAPLE) ×1 IMPLANT
STAPLER CANNULA SEAL XI (STAPLE) ×1
SUT ETHIBOND 0 MO6 C/R (SUTURE) IMPLANT
SUT MNCRL AB 4-0 PS2 18 (SUTURE) ×2 IMPLANT
SUT VIC AB 2-0 SH 27 (SUTURE)
SUT VIC AB 2-0 SH 27XBRD (SUTURE) IMPLANT
SUT VIC AB 3-0 SH 27 (SUTURE) ×2
SUT VIC AB 3-0 SH 27X BRD (SUTURE) ×1 IMPLANT
SUT VICRYL 0 AB UR-6 (SUTURE) ×4 IMPLANT
SYR 20ML LL LF (SYRINGE) IMPLANT
SYR BULB IRRIG 60ML STRL (SYRINGE) IMPLANT
TAPE TRANSPORE STRL 2 31045 (GAUZE/BANDAGES/DRESSINGS) ×2 IMPLANT
TROCAR BALLN GELPORT 12X130M (ENDOMECHANICALS) ×2 IMPLANT

## 2020-11-12 NOTE — Op Note (Signed)
Procedure Date:  11/12/2020  Pre-operative Diagnosis:  Symptomatic cholelithiasis and umbilical hernia  Post-operative Diagnosis:  Symptomatic cholelithiasis and umbilical hernia  Procedure:   1.  Robotic assisted cholecystectomy with ICG FireFly cholangiogram 2.  Open Umbilical hernia repair  Surgeon:  Melvyn Neth, MD  Anesthesia:  General endotracheal  Estimated Blood Loss:  15 ml  Specimens:  gallbladder  Complications:  None  Indications for Procedure:  This is a 50 y.o. female who presents with abdominal pain and workup revealing symptomatic cholelithiasis.  She also had an umbilical hernia noted on CT scan.  The benefits, complications, treatment options, and expected outcomes were discussed with the patient. The risks of bleeding, infection, recurrence of symptoms, failure to resolve symptoms, bile duct damage, bile duct leak, retained common bile duct stone, bowel injury, and need for further procedures were all discussed with the patient and she was willing to proceed.  Description of Procedure: The patient was correctly identified in the preoperative area and brought into the operating room.  The patient was placed supine with VTE prophylaxis in place.  Appropriate time-outs were performed.  Anesthesia was induced and the patient was intubated.  Appropriate antibiotics were infused.  The abdomen was prepped and draped in a sterile fashion. An infraumbilical incision was made. Cautery was used to dissect down the umbilical stalk and to resect the stalk off the fascia.  This revealed a 1.5 cm umbilical hernia defect.  The hernia sac and contents were resected.  The fascial edges were cleared with cautery. A 12 mm robotic port was inserted.  Pneumoperitoneum was obtained with appropriate opening pressures.  Three 8-mm ports were placed in the mid abdomen at the level of the umbilicus under direct visualization.  The DaVinci platform was docked, camera targeted, and instruments  were placed under direct visualization.  The gallbladder was identified.  The fundus was grasped and retracted cephalad.  Adhesions were lysed bluntly and with electrocautery. The infundibulum was grasped and retracted laterally, exposing the peritoneum overlying the gallbladder.  This was incised with electrocautery and extended on either side of the gallbladder.  FireFly cholangiogram was then obtained, and we were able to clearly identify the cystic duct and common bile duct.  The cystic duct and cystic artery were carefully dissected with combination of cautery and blunt dissection.  Both were clipped twice proximally and once distally, cutting in between.  The gallbladder was taken from the gallbladder fossa in a retrograde fashion with electrocautery. There was spillage of bile during this process.  The gallbladder was placed in an Endocatch bag. The liver bed was inspected and any bleeding was controlled with electrocautery. The right upper quadrant was then inspected again revealing intact clips, no bleeding, and no ductal injury.  The area was thoroughly irrigated until clear liquid return.  The 8 mm ports were removed under direct visualization and the 12 mm port was removed.  The Endocatch bag was brought out via the umbilical incision. The hernia defect was then closed using multiple 0 vicryl sutures. The umbilical stalk was reattached using 2-0 Vicryl sutures.  Local anesthetic was infused in all incisions.  The umbilical incision was closed with 3-0 Vicryl and 4-0 Monocryl, and the remaining incisions were closed with 4-0 Monocryl.  The wounds were cleaned and sealed with DermaBond.  The patient was emerged from anesthesia and extubated and brought to the recovery room for further management.  The patient tolerated the procedure well and all counts were correct at the end  of the case.   Melvyn Neth, MD

## 2020-11-12 NOTE — Discharge Instructions (Signed)
AMBULATORY SURGERY  DISCHARGE INSTRUCTIONS   1) The drugs that you were given will stay in your system until tomorrow so for the next 24 hours you should not:  A) Drive an automobile B) Make any legal decisions C) Drink any alcoholic beverage   2) You may resume regular meals tomorrow.  Today it is better to start with liquids and gradually work up to solid foods.  You may eat anything you prefer, but it is better to start with liquids, then soup and crackers, and gradually work up to solid foods.   3) Please notify your doctor immediately if you have any unusual bleeding, trouble breathing, redness and pain at the surgery site, drainage, fever, or pain not relieved by medication.    4) Additional Instructions:        Please contact your physician with any problems or Same Day Surgery at [phone], Monday through Friday 6 am to 4 pm, or [facility] number at [phone].   Minimally Invasive Cholecystectomy, Care After This sheet gives you information about how to care for yourself after your procedure. Your doctor may also give you more specific instructions. If you have problems or questions, contact your doctor. What can I expect after the procedure? After the procedure, it is common:  To have pain at the areas of surgery. You will be given medicines for pain.  To vomit or feel like you may vomit.  To feel fullness in the belly (bloating) or to have pain in the shoulder. This comes from the gas that was used during the surgery. Follow these instructions at home: Medicines  Take over-the-counter and prescription medicines only as told by your doctor.  If you were prescribed an antibiotic medicine, take it as told by your doctor. Do not stop using the antibiotic even if you start to feel better.  Ask your doctor if the medicine prescribed to you: ? Requires you to avoid driving or using machinery. ? Can cause trouble pooping (constipation). You  may need to take these actions to prevent or treat trouble pooping:  Drink enough fluid to keep your pee (urine) pale yellow.  Take over-the-counter or prescription medicines.  Eat foods that are high in fiber. These include beans, whole grains, and fresh fruits and vegetables.  Limit foods that are high in fat and sugar. These include fried or sweet foods. Incision care  Follow instructions from your doctor about how to take care of your cuts from surgery (incisions). Make sure you: ? Wash your hands with soap and water for at least 20 seconds before and after you change your bandage (dressing). If you cannot use soap and water, use hand sanitizer. ? Change your bandage as told by your doctor. ? Leave stitches (sutures), skin glue, or skin tape (adhesive) strips in place. They may need to stay in place for 2 weeks or longer. If tape strips get loose and curl up, you may trim the loose edges. Do not remove tape strips completely unless your doctor says it is okay.  Do not take baths, swim, or use a hot tub until your doctor approves. Ask your doctor if you may take showers. You may only be allowed to take sponge baths.  Check your surgery area every day for signs of infection. Check for: ? More redness, swelling, or pain. ? Fluid or blood. ? Warmth. ? Pus or a bad smell.   Activity  Rest as told by your doctor.  Do not sit  for a long time without moving. Get up to take short walks every 1-2 hours. This is important. Ask for help if you feel weak or unsteady.  Do not lift anything that is heavier than 10 lb (4.5 kg), or the limit that you are told, until your doctor says that it is safe.  Do not play contact sports until your doctor says it is okay.  Do not return to work or school until your doctor says it is okay.  Return to your normal activities as told by your doctor. Ask your doctor what activities are safe for you. General instructions  If you were given a medicine to help  you relax (sedative) during your procedure, it can affect you for many hours. Do not drive or use machinery until your doctor says that it is safe.  Keep all follow-up visits as told by your doctor. This is important. Contact a doctor if:  You get a rash.  You have more redness, swelling, or pain around your cuts from surgery.  You have fluid or blood coming from your cuts from surgery.  Your cuts from surgery feel warm to the touch.  You have pus or a bad smell coming from your cuts from surgery.  You have a fever.  One or more of your cuts from surgery breaks open. Get help right away if:  You have trouble breathing.  You have chest pain.  You have pain that is getting worse in your shoulders.  You faint or feel dizzy when you stand.  You have very bad pain in your belly (abdomen).  You feel like you may vomit or you vomit, and this lasts for more than one day.  You have leg pain. Summary  After your surgery, it is common to have pain at the areas of surgery. You may also have vomiting or fullness in the belly.  Follow your doctor's instructions about medicine, activity restrictions, and caring for your surgery areas. Do not do activities that require a lot of effort.  Contact a doctor if you have a fever or other signs of infection, such as more redness, swelling, or pain around the cuts from surgery.  Get help right away if you have chest pain, increasing pain in the shoulders, or trouble breathing. This information is not intended to replace advice given to you by your health care provider. Make sure you discuss any questions you have with your health care provider. Document Revised: 05/28/2019 Document Reviewed: 05/28/2019 Elsevier Patient Education  2021 [employer].

## 2020-11-12 NOTE — Anesthesia Procedure Notes (Signed)
Procedure Name: Intubation Date/Time: 11/12/2020 10:40 AM Performed by: Allean Found, CRNA Pre-anesthesia Checklist: Patient identified, Patient being monitored, Timeout performed, Emergency Drugs available and Suction available Patient Re-evaluated:Patient Re-evaluated prior to induction Oxygen Delivery Method: Circle system utilized Preoxygenation: Pre-oxygenation with 100% oxygen Induction Type: IV induction Ventilation: Mask ventilation without difficulty Laryngoscope Size: 3 and McGraph Grade View: Grade I Tube type: Oral Tube size: 7.0 mm Number of attempts: 1 Airway Equipment and Method: Stylet Placement Confirmation: ETT inserted through vocal cords under direct vision,  positive ETCO2 and breath sounds checked- equal and bilateral Secured at: 21 cm Tube secured with: Tape Dental Injury: Teeth and Oropharynx as per pre-operative assessment

## 2020-11-12 NOTE — Transfer of Care (Signed)
Immediate Anesthesia Transfer of Care Note  Patient: Sarah [name]  Procedure(s) Performed: XI ROBOTIC ASSISTED LAPAROSCOPIC CHOLECYSTECTOMY (N/A ) INDOCYANINE GREEN FLUORESCENCE IMAGING (ICG) (N/A ) HERNIA REPAIR UMBILICAL ADULT, open (N/A )  Patient Location: PACU  Anesthesia Type:General  Level of Consciousness: sedated  Airway & Oxygen Therapy: Patient Spontanous Breathing and Patient connected to face mask oxygen  Post-op Assessment: Report given to RN and Post -op Vital signs reviewed and stable  Post vital signs: Reviewed and stable  Last Vitals:  Vitals Value Taken Time  BP    Temp    Pulse 83 11/12/20 1306  Resp 14 11/12/20 1306  SpO2 100 % 11/12/20 1306  Vitals shown include unvalidated device data.  Last Pain:  Vitals:   11/12/20 0905  TempSrc: Temporal  PainSc: 0-No pain         Complications: No complications documented.

## 2020-11-12 NOTE — Anesthesia Preprocedure Evaluation (Signed)
Anesthesia Evaluation  Patient identified by MRN, date of birth, ID band Patient awake    Reviewed: Allergy & Precautions, NPO status , Patient's Chart, lab work & pertinent test results  History of Anesthesia Complications (+) PONV and history of anesthetic complications  Airway Mallampati: II       Dental  (+) Teeth Intact   Pulmonary sleep apnea and Continuous Positive Airway Pressure Ventilation , former smoker,    Pulmonary exam normal  (-) decreased breath sounds      Cardiovascular Exercise Tolerance: Good negative cardio ROS Normal cardiovascular exam Rhythm:Regular     Neuro/Psych  Neuromuscular disease negative psych ROS   GI/Hepatic GERD  ,Patient received Oral Contrast Agents,  Endo/Other  Morbid obesity  Renal/GU Renal disease  negative genitourinary   Musculoskeletal  (+) Arthritis , Osteoarthritis,    Abdominal (+) + obese,   Peds negative pediatric ROS (+)  Hematology   Anesthesia Other Findings Past Medical History: No date: Allergy No date: Chicken pox No date: Chronic pain No date: GERD (gastroesophageal reflux disease) No date: History of kidney stones No date: Hyperlipidemia No date: PONV (postoperative nausea and vomiting) No date: Shingles     Comment:  age 50 No date: Sleep apnea     Comment:  uses cpap machine   Reproductive/Obstetrics                             Anesthesia Physical  Anesthesia Plan  ASA: II  Anesthesia Plan: General   Post-op Pain Management:    Induction: Intravenous  PONV Risk Score and Plan:   Airway Management Planned: Oral ETT  Additional Equipment:   Intra-op Plan:   Post-operative Plan: Extubation in OR  Informed Consent: I have reviewed the patients History and Physical, chart, labs and discussed the procedure including the risks, benefits and alternatives for the proposed anesthesia with the patient or authorized  representative who has indicated his/her understanding and acceptance.     Dental advisory given  Plan Discussed with: CRNA  Anesthesia Plan Comments:         Anesthesia Quick Evaluation

## 2020-11-12 NOTE — Interval H&P Note (Signed)
History and Physical Interval Note:  11/12/2020 9:52 AM  Sarah [name]  has presented today for surgery, with the diagnosis of symptomatic cholelithiasis, umbilical hernia.  The various methods of treatment have been discussed with the patient and family. After consideration of risks, benefits and other options for treatment, the patient has consented to  Procedure(s): XI ROBOTIC ASSISTED LAPAROSCOPIC CHOLECYSTECTOMY (N/A) [place] (ICG) (N/A) HERNIA REPAIR UMBILICAL ADULT, open (N/A) as a surgical intervention.  The patient's history has been reviewed, patient examined, no change in status, stable for surgery.  I have reviewed the patient's chart and labs.  Questions were answered to the patient's satisfaction.     Walburga Hudman

## 2020-11-13 ENCOUNTER — Encounter: Payer: Self-pay | Admitting: Surgery

## 2020-11-13 LAB — SURGICAL PATHOLOGY

## 2020-11-13 NOTE — Anesthesia Postprocedure Evaluation (Signed)
Anesthesia Post Note  Patient: Sarah [name]  Procedure(s) Performed: XI ROBOTIC ASSISTED LAPAROSCOPIC CHOLECYSTECTOMY (N/A ) INDOCYANINE GREEN FLUORESCENCE IMAGING (ICG) (N/A ) HERNIA REPAIR UMBILICAL ADULT, open (N/A )  Patient location during evaluation: PACU Anesthesia Type: General Level of consciousness: awake and alert and oriented Pain management: pain level controlled Vital Signs Assessment: post-procedure vital signs reviewed and stable Respiratory status: spontaneous breathing Cardiovascular status: blood pressure returned to baseline Anesthetic complications: no   No complications documented.   Last Vitals:  Vitals:   11/12/20 1407 11/12/20 1427  BP: 109/71 105/66  Pulse: 63 67  Resp: 18 18  Temp: 36.4 C   SpO2: 99% 99%    Last Pain:  Vitals:   11/13/20 0841  TempSrc:   PainSc: 5                  Sarah [name]

## 2020-11-17 ENCOUNTER — Telehealth: Payer: Self-pay | Admitting: *Deleted

## 2020-11-17 NOTE — Telephone Encounter (Signed)
Patient had surgery on 11/12/20 Dr Hampton Abbot gallbladder removal. Since surgery she has a sharp pain on the right side when she takes a deep breath. She is just concerned and wants to make sure this is normal or not. Please call and advise

## 2020-11-17 NOTE — Telephone Encounter (Signed)
Spoke with the patient and she states that the only time she has this is when taking a deep breath. She denies any nausea, chills, fever, or surgical site redness. She is eating well and having regular bowel movements. No concern right now. Patient advised to monitor this as it is slowly improving. She may use a heating pad to the area for comfort. Follow up as scheduled.

## 2020-11-24 ENCOUNTER — Other Ambulatory Visit: Payer: Self-pay

## 2020-11-24 ENCOUNTER — Ambulatory Visit (INDEPENDENT_AMBULATORY_CARE_PROVIDER_SITE_OTHER): Payer: No Typology Code available for payment source | Admitting: Physician Assistant

## 2020-11-24 ENCOUNTER — Encounter: Payer: Self-pay | Admitting: Physician Assistant

## 2020-11-24 VITALS — BP 118/87 | HR 106 | Temp 99.2°F | Ht 62.0 in | Wt 216.8 lb

## 2020-11-24 DIAGNOSIS — Z09 Encounter for follow-up examination after completed treatment for conditions other than malignant neoplasm: Secondary | ICD-10-CM

## 2020-11-24 DIAGNOSIS — K8020 Calculus of gallbladder without cholecystitis without obstruction: Secondary | ICD-10-CM

## 2020-11-24 DIAGNOSIS — K429 Umbilical hernia without obstruction or gangrene: Secondary | ICD-10-CM

## 2020-11-24 MED ORDER — AMOXICILLIN-POT CLAVULANATE 875-125 MG PO TABS
1.0000 | ORAL_TABLET | Freq: Two times a day (BID) | ORAL | 0 refills | Status: AC
  Filled 2020-11-24: qty 20, 10d supply, fill #0

## 2020-11-24 NOTE — Progress Notes (Signed)
[facility] POST-OP OFFICE VISIT  11/24/2020  HPI: Sarah [name] is a 50 y.o. female 12 days s/p robotic assisted laparoscopic cholecystectomy and umbilical hernia repair with Dr Hampton Abbot  She had done reasonably well up until about 48 hours ago. She reports that she has noticed redness at her umbilical incision with drainage. This has become increasingly painful as well. She reports a temperature of 72F at home but states "that is a fever for her." Additionally, she continues to have soreness in her RUQ, specifically with deep inspiration. This has improved some since surgery but not completely resolved. She has tried tylenol and motrin at home without significant improvement. Only using oxycodone QHS.   Vital signs: BP 118/87   Pulse (!) 106   Temp 99.2 F (37.3 C) (Oral)   Ht 5\' 2"  (1.575 m)   Wt 216 lb 12.8 oz (98.3 kg)   SpO2 98%   BMI 39.65 kg/m    Physical Exam: Constitutional: Appears somewhat uncomfortable, NAD Abdomen: Soft, she is tender at her umbilical incision, non-distended, no rebound/guarding Skin: Her umbilical incision is markedly erythematous extending inferiorly, there is induration but no palpable fluctuance, no expressible drainage. There remaining incisions have healed well.   Assessment/Plan: This is a 50 y.o. female 12 days s/p robotic assisted laparoscopic cholecystectomy and umbilical hernia repair    - I do believe she has cellulitis at her umbilical incision, there is no appreciable abscess on examination. I will start her on Augmentin BID x10 days for this  - Reviewed signs and symptoms of worsening including, but not limited to, fever >101, worsening pain, and purulent drainage. She understands that should this develop she needs to call the office or go to the ED  - Continue current pain medication regimen  - Continue lifting restrictions; I did update her work note  - Reviewed local wound care  - reviewed pathology: CCC, benign  cholesterol polyp, negative for malignancy  - She will return to clinic on 06/06 for re-check   -- Edison Simon, PA-C [facility] 11/24/2020, 4:02 PM [phone] M-F: 7am - 4pm

## 2020-11-24 NOTE — Patient Instructions (Addendum)
Please pick up your medication at the pharmacy. If you develop a fever of 101 or higher  call us. Try Tylenol or ibuprofen for the pain.

## 2020-11-26 ENCOUNTER — Encounter: Payer: Self-pay | Admitting: Physician Assistant

## 2020-11-26 ENCOUNTER — Encounter: Payer: Self-pay | Admitting: Surgery

## 2020-11-26 ENCOUNTER — Other Ambulatory Visit: Payer: Self-pay

## 2020-11-26 ENCOUNTER — Ambulatory Visit (INDEPENDENT_AMBULATORY_CARE_PROVIDER_SITE_OTHER): Payer: No Typology Code available for payment source | Admitting: Physician Assistant

## 2020-11-26 VITALS — BP 111/82 | HR 102 | Temp 98.5°F | Ht 62.0 in | Wt 216.4 lb

## 2020-11-26 DIAGNOSIS — L02216 Cutaneous abscess of umbilicus: Secondary | ICD-10-CM

## 2020-11-26 NOTE — Progress Notes (Signed)
Procedure Note  Date: 11/26/20 1:26 PM  Preforming Provider: Edison Simon, PA-C  Pre-Procedure Diagnosis: Umbilical Wound abscess  Post-Procedure Diagnosis: Same  Anesthesia: 10 ccs of 1% lidocaine with epinephrine  Findings: Small amount of purulent drainage which was sent for Cx  Details of Procedure:  All risks, benefits, and alternatives to above procedure(s) were discussed with the patient and informed consent was obtained. The patient's umbilical wound was prepped and draped in standard sterile fashion. 10 ccs of 1% lidocaine with epinephrine with injected intradermally and adequate anesthesia achieved. Using an 11 blade scalpel, an incision was made over the patient's previous incision site. Minimal amount of purulent drainage was expressed and sent for Cx. Fibrinous tissue was sharply debrided from the wound with scissor. The wound was probed to ensure all loculations were broken up. The wound was then irrigated with copious amount of NS and packed with 1/2 inch iodoform gauze. The patient tolerated this well without immediate complications. All sharps were accounted for and disposed of properly at the end of the procedure.   Complications: None apparent  Plan: Continue current ABx regimen, will follow up culture results. Reviewed wound care instructions. She will maintain her current follow up appointment on 06/06  --  Edison Simon, PA-C [facility] 11/26/2020, 1:26 PM [phone] M-F: 7am - 4pm

## 2020-11-26 NOTE — Patient Instructions (Signed)
Continue taking the Antibiotic until gone. Remove the packing material, wash the area with soap and water and rinse well. Place new packing material gently using a q-tip into the wound. Cover with a dry dressing.  Keep your scheduled appointment for Monday.

## 2020-11-30 ENCOUNTER — Ambulatory Visit (INDEPENDENT_AMBULATORY_CARE_PROVIDER_SITE_OTHER): Payer: No Typology Code available for payment source | Admitting: Physician Assistant

## 2020-11-30 ENCOUNTER — Other Ambulatory Visit: Payer: Self-pay

## 2020-11-30 ENCOUNTER — Encounter: Payer: Self-pay | Admitting: Physician Assistant

## 2020-11-30 VITALS — BP 124/84 | HR 83 | Temp 98.4°F | Ht 62.0 in | Wt 219.4 lb

## 2020-11-30 DIAGNOSIS — L02216 Cutaneous abscess of umbilicus: Secondary | ICD-10-CM

## 2020-11-30 DIAGNOSIS — K429 Umbilical hernia without obstruction or gangrene: Secondary | ICD-10-CM

## 2020-11-30 DIAGNOSIS — K8020 Calculus of gallbladder without cholecystitis without obstruction: Secondary | ICD-10-CM

## 2020-11-30 DIAGNOSIS — Z09 Encounter for follow-up examination after completed treatment for conditions other than malignant neoplasm: Secondary | ICD-10-CM

## 2020-11-30 NOTE — Progress Notes (Signed)
[facility] POST-OP OFFICE VISIT  11/30/2020  HPI: Sarah [name] is a 50 y.o. female 18 days s/p robotic assisted laparoscopic cholecystectomy and umbilical hernia repair with Dr Hampton Abbot, which has been complicated by umbilical wound infection s/p I&D on 06/02  Since last visit, she has reported significant improvement Her erythema has nearly resolved She still has some serosanguinous drainage from the wound, but she is packing this as instructed She does notice some discoloration ("whitish") to the her umbilicus as well Cx with GNR; susceptibilities pending  She continues on Augmentin; no issues with this  Vital signs: BP 124/84   Pulse 83   Temp 98.4 F (36.9 C)   Ht 5\' 2"  (1.575 m)   Wt 219 lb 6.4 oz (99.5 kg)   SpO2 97%   BMI 40.13 kg/m    Physical Exam: Constitutional:  Well appearing female, NAD Abdomen: Soft, non-tender, non-distended, no rebound/guarding Skin: Her umbilical incision is now healing via secondary intention, visible wound bed appears to be granulating. She does have some white discoloration to her umbilicus, likely superficial necrosis,strength of these tissues remains intact. No purulence. Previously seen erythema now resolved. The remaining laparoscopic incisions are well healed  Assessment/Plan: This is a 50 y.o. female 18 days s/p robotic assisted laparoscopic cholecystectomy and umbilical hernia repair, which has been complicated by umbilical wound infection s/p I&D on 06/02   - She has made objective improvement in the last 4 days. Will continue Augmentin as prescribed. Cx with GNR but susceptibilities pending  - Reviewed wound care; switched to plain packing today  - She will follow up in ~1 week for wound check  -- Edison Simon, PA-C [facility] 11/30/2020, 10:26 AM [phone] M-F: 7am - 4pm

## 2020-11-30 NOTE — Patient Instructions (Signed)
OK to return to work tomorrow. Start packing the area with Plain 1/4 inch packing.  Follow up her next week.

## 2020-12-03 LAB — ANAEROBIC AND AEROBIC CULTURE

## 2020-12-07 ENCOUNTER — Encounter: Payer: Self-pay | Admitting: Surgery

## 2020-12-08 ENCOUNTER — Encounter: Payer: No Typology Code available for payment source | Admitting: Physician Assistant

## 2020-12-10 ENCOUNTER — Other Ambulatory Visit: Payer: Self-pay

## 2020-12-10 ENCOUNTER — Ambulatory Visit (INDEPENDENT_AMBULATORY_CARE_PROVIDER_SITE_OTHER): Payer: No Typology Code available for payment source | Admitting: Physician Assistant

## 2020-12-10 ENCOUNTER — Encounter: Payer: Self-pay | Admitting: Physician Assistant

## 2020-12-10 VITALS — BP 121/81 | HR 85 | Temp 98.0°F | Ht 62.0 in | Wt 219.0 lb

## 2020-12-10 DIAGNOSIS — L02216 Cutaneous abscess of umbilicus: Secondary | ICD-10-CM

## 2020-12-10 DIAGNOSIS — Z09 Encounter for follow-up examination after completed treatment for conditions other than malignant neoplasm: Secondary | ICD-10-CM

## 2020-12-10 DIAGNOSIS — K429 Umbilical hernia without obstruction or gangrene: Secondary | ICD-10-CM

## 2020-12-10 DIAGNOSIS — K8020 Calculus of gallbladder without cholecystitis without obstruction: Secondary | ICD-10-CM

## 2020-12-10 NOTE — Progress Notes (Signed)
[facility] POST-OP OFFICE VISIT  12/10/2020  HPI: Sarah [name] is a 50 y.o. female 1 month s/p robotic assisted laparoscopic cholecystectomy and umbilical hernia repair with Dr Hampton Abbot, which has been complicated by umbilical wound infection s/p I&D on 06/02  She has done well at home Wound continues to have small amount of serous drainage, she is using some packing to control this There remains a fibrinous area in her umbilicus  No fever, chills, erythema She has completed her course of Abx.   Vital signs: BP 121/81   Pulse 85   Temp 98 F (36.7 C)   Ht 5\' 2"  (1.575 m)   Wt 219 lb (99.3 kg)   SpO2 98%   BMI 40.06 kg/m    Physical Exam: Constitutional: Well appearing female, NAD Abdomen: Soft, non-tender, non-distended, no rebound/guarding Skin: Umbilicus is healing well, wound bed is 100% granulation tissue, I was able to debride a significant amount of fibrinous tissue from the wound bed, no erythema, no drainage   Assessment/Plan: This is a 50 y.o. female 1 month s/p robotic assisted laparoscopic cholecystectomy and umbilical hernia repair with Dr Hampton Abbot, which has been complicated by umbilical wound infection s/p I&D on 06/02   - She will continue local wound care with packing as needed, cover with gauze, secure  - Reviewed wound care; okay to shower  - She has completed lifting restrictions  - No further need for Abx  - she would like to return to clinic in about 3 weeks prior to a beach trip for wound check  -- Edison Simon, PA-C [facility] 12/10/2020, 3:44 PM [phone] M-F: 7am - 4pm

## 2020-12-10 NOTE — Patient Instructions (Addendum)
You may keep some packing in the area just to help wick any drainage out.   Follow up before your beach trip just to check the area.   GENERAL POST-OPERATIVE PATIENT INSTRUCTIONS   WOUND CARE INSTRUCTIONS: Try to keep the wound dry and avoid ointments on the wound unless directed to do so.  If the wound becomes bright red and painful or starts to drain infected material that is not clear, please contact your physician immediately.  If the wound is mildly pink and has a thick firm ridge underneath it, this is normal, and is referred to as a healing ridge.  This will resolve over the next 4-6 weeks.  BATHING: You may shower if you have been informed of this by your surgeon. However, Please do not submerge in a tub, hot tub, or pool until incisions are completely sealed or have been told by your surgeon that you may do so.  DIET:  You may eat any foods that you can tolerate.  It is a good idea to eat a high fiber diet and take in plenty of fluids to prevent constipation.  If you do become constipated you may want to take a mild laxative or take ducolax tablets on a daily basis until your bowel habits are regular.  Constipation can be very uncomfortable, along with straining, after recent surgery.  ACTIVITY:  You are encouraged to walk and engage in light activity for the next two weeks.  You should not lift more than 20 pounds for 4-6 weeks after surgery as it could put you at increased risk for complications.  Twenty pounds is roughly equivalent to a plastic bag of groceries. At that time- Listen to your body when lifting, if you have pain when lifting, stop and then try again in a few days. Soreness after doing exercises or activities of daily living is normal as you get back in to your normal routine.  MEDICATIONS:  Try to take narcotic medications and anti-inflammatory medications, such as tylenol, ibuprofen, naprosyn, etc., with food.  This will minimize stomach upset from the medication.  Should  you develop nausea and vomiting from the pain medication, or develop a rash, please discontinue the medication and contact your physician.  You should not drive, make important decisions, or operate machinery when taking narcotic pain medication.  SUNBLOCK Use sun block to incision area over the next year if this area will be exposed to sun. This helps decrease scarring and will allow you avoid a permanent darkened area over your incision.  QUESTIONS:  Please feel free to call our office if you have any questions, and we will be glad to assist you.

## 2020-12-25 ENCOUNTER — Encounter: Payer: No Typology Code available for payment source | Admitting: Internal Medicine

## 2020-12-31 ENCOUNTER — Encounter: Payer: Self-pay | Admitting: Physician Assistant

## 2020-12-31 ENCOUNTER — Ambulatory Visit (INDEPENDENT_AMBULATORY_CARE_PROVIDER_SITE_OTHER): Payer: No Typology Code available for payment source | Admitting: Physician Assistant

## 2020-12-31 ENCOUNTER — Other Ambulatory Visit: Payer: Self-pay

## 2020-12-31 VITALS — BP 114/80 | HR 90 | Temp 98.4°F | Ht 62.0 in | Wt 215.0 lb

## 2020-12-31 DIAGNOSIS — L02216 Cutaneous abscess of umbilicus: Secondary | ICD-10-CM

## 2020-12-31 DIAGNOSIS — Z09 Encounter for follow-up examination after completed treatment for conditions other than malignant neoplasm: Secondary | ICD-10-CM

## 2020-12-31 DIAGNOSIS — K8020 Calculus of gallbladder without cholecystitis without obstruction: Secondary | ICD-10-CM

## 2020-12-31 NOTE — Patient Instructions (Signed)
Keep the area clean and dry. No need to pack the area. When you shower remove the gauze, let the warm soapy water run over the area, rinse well, and pat dry and replace the gauze.   Follow-up with our office as needed.  Please call and ask to speak with a nurse if you develop questions or concerns.

## 2020-12-31 NOTE — Progress Notes (Signed)
[facility] POST-OP OFFICE VISIT  12/31/2020  HPI: Sarah [name] is a 50 y.o. female ~1.5 months s/p robotic assisted laparoscopic cholecystectomy and umbilical hernia repair with Dr Hampton Abbot, which has been complicated by umbilical wound infection s/p I&D on 06/02  Since her last visit on 06/16, she has done well The wound continues to heal. She does still intermittently notice serous drainage from her umbilicus No erythema, fever, chills No other complaints  Vital signs: BP 114/80   Pulse 90   Temp 98.4 F (36.9 C)   Ht 5\' 2"  (1.575 m)   Wt 215 lb (97.5 kg)   SpO2 98%   BMI 39.32 kg/m    Physical Exam: Well appearing female, NAD Abdomen: Soft, non-tender, non-distended, no rebound/guarding Skin: Umbilicus is healing well, wound bed is 100% granulation tissue, wound is now 2 x 4 mm and is superficial, there was a small amount of fibrinous tissue which I removed, no erythema, no drainage   Assessment/Plan: This is a 50 y.o. female ~1.5 months s/p robotic assisted laparoscopic cholecystectomy and umbilical hernia repair with Dr Hampton Abbot, which has been complicated by umbilical wound infection s/p I&D on 06/02   - Continue local wound care with superficial dressing  - Reviewed wound care; suspect this will be completely healed in ~1 week  - She can follow up as needed; she understands to call with questions or concerns  -- Edison Simon, PA-C [facility] 12/31/2020, 1:36 PM [phone] M-F: 7am - 4pm

## 2021-01-15 ENCOUNTER — Other Ambulatory Visit: Payer: Self-pay | Admitting: Internal Medicine

## 2021-01-15 ENCOUNTER — Other Ambulatory Visit (HOSPITAL_COMMUNITY)
Admission: RE | Admit: 2021-01-15 | Discharge: 2021-01-15 | Disposition: A | Discharge status: Home / Self Care | Payer: No Typology Code available for payment source | Source: Ambulatory Visit | Attending: Internal Medicine | Admitting: Internal Medicine

## 2021-01-15 ENCOUNTER — Ambulatory Visit (INDEPENDENT_AMBULATORY_CARE_PROVIDER_SITE_OTHER): Payer: No Typology Code available for payment source | Admitting: Internal Medicine

## 2021-01-15 ENCOUNTER — Other Ambulatory Visit: Payer: Self-pay

## 2021-01-15 ENCOUNTER — Encounter: Payer: Self-pay | Admitting: Internal Medicine

## 2021-01-15 VITALS — BP 122/80 | HR 70 | Temp 98.1°F | Ht 62.84 in | Wt 219.2 lb

## 2021-01-15 DIAGNOSIS — Z1211 Encounter for screening for malignant neoplasm of colon: Secondary | ICD-10-CM

## 2021-01-15 DIAGNOSIS — Z124 Encounter for screening for malignant neoplasm of cervix: Secondary | ICD-10-CM | POA: Diagnosis present

## 2021-01-15 DIAGNOSIS — T148XXA Other injury of unspecified body region, initial encounter: Secondary | ICD-10-CM

## 2021-01-15 DIAGNOSIS — K219 Gastro-esophageal reflux disease without esophagitis: Secondary | ICD-10-CM | POA: Diagnosis not present

## 2021-01-15 DIAGNOSIS — L089 Local infection of the skin and subcutaneous tissue, unspecified: Secondary | ICD-10-CM

## 2021-01-15 DIAGNOSIS — Z1231 Encounter for screening mammogram for malignant neoplasm of breast: Secondary | ICD-10-CM

## 2021-01-15 DIAGNOSIS — E785 Hyperlipidemia, unspecified: Secondary | ICD-10-CM | POA: Diagnosis not present

## 2021-01-15 DIAGNOSIS — Z0001 Encounter for general adult medical examination with abnormal findings: Secondary | ICD-10-CM

## 2021-01-15 DIAGNOSIS — B373 Candidiasis of vulva and vagina: Secondary | ICD-10-CM

## 2021-01-15 DIAGNOSIS — Z13818 Encounter for screening for other digestive system disorders: Secondary | ICD-10-CM

## 2021-01-15 DIAGNOSIS — B3731 Acute candidiasis of vulva and vagina: Secondary | ICD-10-CM

## 2021-01-15 DIAGNOSIS — Z0000 Encounter for general adult medical examination without abnormal findings: Secondary | ICD-10-CM

## 2021-01-15 MED ORDER — PANTOPRAZOLE SODIUM 40 MG PO TBEC
40.0000 mg | DELAYED_RELEASE_TABLET | Freq: Every day | ORAL | 3 refills | Status: DC
  Filled 2021-01-15: qty 90, 90d supply, fill #0
  Filled 2021-04-21: qty 90, 90d supply, fill #1
  Filled 2021-07-28: qty 90, 90d supply, fill #2
  Filled 2021-12-17: qty 90, 90d supply, fill #3

## 2021-01-15 MED ORDER — CIPROFLOXACIN HCL 500 MG PO TABS
500.0000 mg | ORAL_TABLET | Freq: Two times a day (BID) | ORAL | 0 refills | Status: AC
  Filled 2021-01-15: qty 10, 5d supply, fill #0

## 2021-01-15 MED ORDER — FLUCONAZOLE 150 MG PO TABS
150.0000 mg | ORAL_TABLET | Freq: Once | ORAL | 0 refills | Status: AC
  Filled 2021-01-15: qty 2, 2d supply, fill #0

## 2021-01-15 MED ORDER — MUPIROCIN 2 % EX OINT
1.0000 "application " | TOPICAL_OINTMENT | Freq: Two times a day (BID) | CUTANEOUS | 0 refills | Status: DC
  Filled 2021-01-15: qty 30, 15d supply, fill #0

## 2021-01-15 NOTE — Patient Instructions (Signed)
Dove antibacterial soap/bodywash

## 2021-01-15 NOTE — Progress Notes (Signed)
Chief Complaint  Patient presents with   Annual Exam   Annual  1. Wound infection since 11/24/20 with repeat I&D abscess/drainage tx'ed augmentin bid x 10 days  3 bacteria grew and 2/3 could be tx'ed augmentin but not citrobacter  Spoke with id today who rec cipro or bactrim but sulfa allergic     Review of Systems  Constitutional:  Negative for weight loss.  HENT:  Negative for hearing loss.   Eyes:  Negative for blurred vision.  Respiratory:  Negative for shortness of breath.   Cardiovascular:  Negative for chest pain.  Gastrointestinal:  Negative for abdominal pain.  Musculoskeletal:  Negative for falls and joint pain.  Skin:  Negative for rash.  Neurological:  Negative for headaches.  Psychiatric/Behavioral:  Negative for depression.   Past Medical History:  Diagnosis Date   Allergy    Chicken pox    Chronic pain    GERD (gastroesophageal reflux disease)    History of kidney stones    Hyperlipidemia    PONV (postoperative nausea and vomiting)    Shingles    age 50   Sleep apnea    uses cpap machine    Past Surgical History:  Procedure Laterality Date   ABDOMINAL HYSTERECTOMY  01/2001   DUB h/o abnormal pap ovaries intact age early 81s abnormal pap   CESAREAN SECTION  12/1987   CESAREAN SECTION  05/1993   COLONOSCOPY WITH PROPOFOL N/A 01/29/2016   Procedure: COLONOSCOPY WITH PROPOFOL;  Surgeon: Manya Silvas, MD;  Location: [facility];  Service: Endoscopy;  Laterality: N/A;   LITHOTRIPSY  [id number]   UMBILICAL HERNIA REPAIR N/A 11/12/2020   Procedure: HERNIA REPAIR UMBILICAL ADULT, open;  Surgeon: Olean Ree, MD;  Location: [facility];  Service: General;  Laterality: N/A;   Family History  Problem Relation Age of Onset   Breast cancer Mother 35   Cancer Mother        breast   Colon cancer Father 36   Lung cancer Father    Diabetes Father    Hypertension Paternal Grandfather    Breast cancer Maternal Grandmother        30's   Cancer Maternal Grandmother         breast    Cancer Cousin        breast    Social History   Socioeconomic History   Marital status: Widowed    Spouse name: Not on file   Number of children: Not on file   Years of education: Not on file   Highest education level: Not on file  Occupational History   Not on file  Tobacco Use   Smoking status: Former    Packs/day: 0.50    Years: 10.00    Pack years: 5.00    Types: Cigarettes    Quit date: 06/27/2010    Years since quitting: 10.5   Smokeless tobacco: Never   Tobacco comments:    quit 2015---currently using e cig  Vaping Use   Vaping Use: Former   Quit date: 02/27/2017   Devices: quit 2018  Substance and Sexual Activity   Alcohol use: Yes    Alcohol/week: 0.0 standard drinks    Comment: occasional and very rarely    Drug use: No   Sexual activity: Yes    Partners: Male    Birth control/protection: Surgical    Comment: Husband  Other Topics Concern   Not on file  Social History Narrative   Married but husband of 38  years died 08/2019 of throat cancer which spread   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   cholecalciferol (VITAMIN D3) 25 MCG (1000 UNIT) tablet Take 1,000 Units by mouth daily.   ciprofloxacin (CIPRO) 500 MG tablet Take 1 tablet (500 mg total) by mouth 2 (two) times daily for 5 days. With food   fluconazole (DIFLUCAN) 150 MG tablet Take 1 tablet (150 mg total) by mouth once for 1 dose. Repeat 3 days prn   ibuprofen (ADVIL) 600 MG tablet Take 1 tablet  (600 mg total) by mouth every 8 (eight) hours as needed for moderate pain.   loratadine (CLARITIN) 10 MG tablet Take 10 mg by mouth daily as needed for allergies.   magnesium oxide (MAG-OX) 400 MG tablet Take 400 mg by mouth daily.   mupirocin ointment (BACTROBAN) 2 % Apply 1 application topically 2 (two) times daily.   [DISCONTINUED] pantoprazole (PROTONIX) 40 MG tablet Take 1 tablet (40 mg total) by mouth daily. 30 minutes before   Allergies  Allergen Reactions   Aspirin     bleeding   Sulfa Antibiotics Hives   Recent Results (from the past 2160 hour(s))  CBC with Differential/Platelet     Status: None   Collection Time: 10/19/20  4:19 PM  Result Value Ref Range   WBC 8.7 4.0 - 10.5 K/uL   RBC 4.49 3.87 - 5.11 Mil/uL   Hemoglobin 13.5 12.0 - 15.0 g/dL   HCT 39.6 36.0 - 46.0 %   MCV 88.1 78.0 - 100.0 fl   MCHC 34.1 30.0 - 36.0 g/dL   RDW 14.7 11.5 - 15.5 %   Platelets 237.0 150.0 - 400.0 K/uL   Neutrophils Relative % 59.8 43.0 - 77.0 %   Lymphocytes Relative 31.8 12.0 - 46.0 %   Monocytes Relative 5.6 3.0 - 12.0 %   Eosinophils Relative 2.1 0.0 - 5.0 %   Basophils Relative 0.7 0.0 - 3.0 %   Neutro Abs 5.2 1.4 - 7.7 K/uL   Lymphs Abs 2.8 0.7 - 4.0 K/uL   Monocytes Absolute 0.5 0.1 - 1.0 K/uL   Eosinophils Absolute 0.2 0.0 - 0.7 K/uL   Basophils Absolute 0.1 0.0 - 0.1 K/uL  Comprehensive Metabolic Panel (CMET)     Status: None   Collection Time: 10/19/20  4:19 PM  Result Value Ref Range   Sodium 139 135 - 145 mEq/L   Potassium 4.5 3.5 - 5.1 mEq/L   Chloride 102 96 - 112 mEq/L   CO2 31 19 - 32 mEq/L   Glucose, Bld 90 70 - 99 mg/dL   BUN 21 6 - 23 mg/dL   Creatinine, Ser 0.86 0.40 - 1.20 mg/dL   Total Bilirubin 0.4 0.2 - 1.2 mg/dL   Alkaline Phosphatase 71 39 - 117 U/L   AST 15 0 - 37 U/L   ALT 17 0 - 35 U/L   Total Protein  6.6 6.0 - 8.3 g/dL   Albumin 4.2 3.5 - 5.2 g/dL   GFR 78.02 >60.00 mL/min    Comment: Calculated using the CKD-EPI Creatinine Equation (2021)    Calcium 9.9 8.4 - 10.5 mg/dL  Lipase     Status: None   Collection Time: 10/19/20  4:19 PM  Result Value Ref Range   Lipase 41.0 11.0 - 59.0 U/L  Amylase     Status: None   Collection Time: 10/19/20  4:19 PM  Result Value Ref Range   Amylase 47 27 - 131 U/L  Surgical pathology     Status: None   Collection Time: 11/12/20 10:07 AM  Result Value Ref Range   SURGICAL PATHOLOGY      SURGICAL PATHOLOGY CASE: [id number] PATIENT: Braulio Bosch Surgical Pathology Report     Specimen Submitted: A. Gallbladder  Clinical History: symptomatic cholelithiasis, umbilical hernia    DIAGNOSIS: A. GALLBLADDER; CHOLECYSTECTOMY: - MILD CHRONIC CHOLECYSTITIS WITH CHOLELITHIASIS AND BENIGN CHOLESTEROL POLYP. - ONE BENIGN LYMPH NODE. - NEGATIVE FOR DYSPLASIA AND MALIGNANCY.  GROSS DESCRIPTION: A. Labeled: Gallbladder Received: Formalin Collection time: 11:58 AM on 11/12/2020 Placed into formalin time: 12:21 PM on 11/12/2020 Size of specimen: 6.8 x 3.2 x 2.9 cm Specimen integrity: Intact External surface: The serosa is green-pink, smooth, and glistening with a rough hepatic bed. Wall thickness: Ranges from 0.1-0.2 cm Mucosa: The mucosa is tan and velvety with scattered pedunculated areas of cholesterolosis. Cystic duct: The cystic duct is 1 x 0.4 x 0.3 cm.  The duct is patent and grossly unremarkable.  There is a potential adjacent lymph node candi date, 0.5 x 0.4 x 0.2 cm. Bile present: There is tan-green viscous bile. Stones present: There are multiple black-brown, firm, and lobulated stones, 1.4 x 1.4 x 0.5 cm in aggregate. Other findings: None grossly appreciated.  Block summary: 1 - cystic duct resection margin (en face and inked blue), lymph node candidate (submitted entirely), and representative wall with pedunculated cholesterolosis  RB 11/12/2020  Final Diagnosis performed by Allena Napoleon, MD.   Electronically signed 11/13/2020 9:18:34AM The electronic signature  indicates that the named Attending Pathologist has evaluated the specimen Technical component performed at [facility], [address] [place], [place] [address] Lab: [phone] Dir: Rush Farmer, MD, MMM  Professional component performed at [facility], [facility], [address] [place], [place] [address] Lab: [id number] Dir: Dellia Nims. Rubinas, MD   Anaerobic and Aerobic Culture     Status: Abnormal   Collection Time: 11/26/20  1:42 PM  Result Value Ref Range   Anaerobic Culture Final report    Result 1 Comment     Comment: No anaerobic growth in 72 hours.   Aerobic Culture Final report (A)    Result 1 Enterococcus faecalis (A)     Comment: For Enterococcus species, aminoglycosides (except for high-level resistance screening), cephalosporins, clindamycin, and trimethoprim-sulfamethoxazole are not effective clinically. (CLSI, M100-S26, 2016) Heavy growth    Result 2 Citrobacter freundii (A)     Comment: Heavy growth   Result 3 Klebsiella pneumoniae (A)     Comment: Heavy growth   Antimicrobial Susceptibility Comment     Comment:       ** S = Susceptible; I = Intermediate; R = Resistant **                    P = Positive; N = Negative             MICS are expressed in micrograms per  mL    Antibiotic                 RSLT#1    RSLT#2    RSLT#3    RSLT#4 Amoxicillin/Clavulanic Acid              R         S Ampicillin                                         R Cefazolin                                R Cefepime                                 S         S Ceftriaxone                              S         S Cefuroxime                               R         S Ciprofloxacin                            S         S Ertapenem                                S         S Gentamicin                               S         S Imipenem                                 R         S Levofloxacin                             S         S Meropenem                                S          S Penicillin                     S Piperacillin/Tazobactam                            S Tetracycline                             S          S Tobramycin  S         S Trimethoprim/Sulfa                       S         S Vancomycin                     S    Objective  Body mass index is 39.03 kg/m. Wt Readings from Last 3 Encounters:  01/15/21 219 lb 3.2 oz (99.4 kg)  12/31/20 215 lb (97.5 kg)  12/10/20 219 lb (99.3 kg)   Temp Readings from Last 3 Encounters:  01/15/21 98.1 F (36.7 C) (Oral)  12/31/20 98.4 F (36.9 C)  12/10/20 98 F (36.7 C)   BP Readings from Last 3 Encounters:  01/15/21 122/80  12/31/20 114/80  12/10/20 121/81   Pulse Readings from Last 3 Encounters:  01/15/21 70  12/31/20 90  12/10/20 85    Physical Exam Vitals and nursing note reviewed.  Constitutional:      Appearance: Normal appearance. She is well-developed and well-groomed. She is obese.  HENT:     Head: Normocephalic and atraumatic.  Eyes:     Conjunctiva/sclera: Conjunctivae normal.     Pupils: Pupils are equal, round, and reactive to light.  Cardiovascular:     Rate and Rhythm: Normal rate and regular rhythm.     Heart sounds: Normal heart sounds. No murmur heard. Pulmonary:     Breath sounds: Normal breath sounds.  Chest:  Breasts:    Breasts are symmetrical.     Right: Normal. No axillary adenopathy.     Left: Normal. No axillary adenopathy.  Abdominal:     Tenderness: There is no abdominal tenderness.  Genitourinary:    Exam position: Supine.     Labia:        Right: No rash.        Left: No rash.      Vagina: Normal.     Cervix: Discharge present.     Uterus: Absent.      Adnexa: Right adnexa normal and left adnexa normal.     Comments: S/p hysterectomy h/o abnormal pap  Lymphadenopathy:     Upper Body:     Right upper body: No axillary adenopathy.     Left upper body: No axillary adenopathy.  Skin:    General: Skin is warm and  dry.  Neurological:     General: No focal deficit present.     Mental Status: She is alert and oriented to person, place, and time. Mental status is at baseline.     Gait: Gait normal.  Psychiatric:        Attention and Perception: Attention and perception normal.        Mood and Affect: Mood and affect normal.        Speech: Speech normal.        Behavior: Behavior normal. Behavior is cooperative.        Thought Content: Thought content normal.        Cognition and Memory: Cognition and memory normal.        Judgment: Judgment normal.    Assessment  Plan  Annual physical exam Flu shot utd  Tdap had 12/07/11 Consider MMR vaccine Hep B immune  Hep C pending Consider shingrix in future  Declines covid 19 vaccine for now  Declines STD testing   Pap had 12/09/16 neg pap neg HPV Q5 years s/p hysterectomy h/o abnormal  pap still has ovaries  Due pap today    mammo 04/2020 neg referred   Dermatology as of 01/15/21 never seen call back when ready for referral   Disc healthy diet choices, exercise and sunscreen  rec healthy diet and exercise   Colonoscopy saw 01/29/16 Dr. Tiffany Kocher tubular adenoma + and FH dad colon cancer due 01/28/21 , referred today 01/15/21    Wound infection - Plan: mupirocin ointment (BACTROBAN) 2 %, ciprofloxacin (CIPRO) 500 MG tablet Needs broader coverage wound still draining since 11/24/20 and after tx augmentin bid x 10 days  Consulted ID and make referral to [facility]   Hyperlipidemia, unspecified hyperlipidemia type - Plan: Lipid panel  Gastroesophageal reflux disease without esophagitis - Plan: pantoprazole (PROTONIX) 40 MG tablet  Yeast vaginitis - Plan: fluconazole (DIFLUCAN) 150 MG tablet  Provider: Dr. Olivia Mackie McLean-[facility]oc[facility]

## 2021-01-20 LAB — CYTOLOGY - PAP
Adequacy: ABSENT
Comment: NEGATIVE
Diagnosis: NEGATIVE
High risk HPV: NEGATIVE

## 2021-02-03 ENCOUNTER — Other Ambulatory Visit: Payer: Self-pay

## 2021-02-03 ENCOUNTER — Other Ambulatory Visit (INDEPENDENT_AMBULATORY_CARE_PROVIDER_SITE_OTHER): Payer: No Typology Code available for payment source

## 2021-02-03 DIAGNOSIS — E785 Hyperlipidemia, unspecified: Secondary | ICD-10-CM | POA: Diagnosis not present

## 2021-02-03 DIAGNOSIS — Z13818 Encounter for screening for other digestive system disorders: Secondary | ICD-10-CM | POA: Diagnosis not present

## 2021-02-03 LAB — LIPID PANEL
Cholesterol: 188 mg/dL (ref 0–200)
HDL: 59.3 mg/dL (ref >39.00)
LDL Cholesterol: 116 mg/dL — ABNORMAL HIGH (ref 0–99)
NonHDL: 128.21
Total CHOL/HDL Ratio: 3
Triglycerides: 60 mg/dL (ref 0.0–149.0)
VLDL: 12 mg/dL (ref 0.0–40.0)

## 2021-02-04 LAB — HEPATITIS C ANTIBODY
Hepatitis C Ab: NONREACTIVE
SIGNAL TO CUT-OFF: 0.01 (ref <1.00)

## 2021-02-08 ENCOUNTER — Telehealth: Payer: No Typology Code available for payment source | Admitting: Nurse Practitioner

## 2021-02-08 ENCOUNTER — Encounter: Payer: Self-pay | Admitting: Nurse Practitioner

## 2021-02-08 ENCOUNTER — Other Ambulatory Visit: Payer: Self-pay

## 2021-02-08 DIAGNOSIS — J40 Bronchitis, not specified as acute or chronic: Secondary | ICD-10-CM

## 2021-02-08 MED ORDER — CARESTART COVID-19 HOME TEST VI KIT
PACK | 0 refills | Status: DC
  Filled 2021-02-08: qty 2, 4d supply, fill #0

## 2021-02-08 MED ORDER — AZITHROMYCIN 250 MG PO TABS
ORAL_TABLET | ORAL | 0 refills | Status: AC
  Filled 2021-02-08: qty 6, 5d supply, fill #0

## 2021-02-08 NOTE — Progress Notes (Signed)
Virtual Visit Consent   Sarah [name], you are scheduled for a virtual visit with a [facility] provider today.     Just as with appointments in the office, your consent must be obtained to participate.  Your consent will be active for this visit and any virtual visit you may have with one of our providers in the next 365 days.     If you have a MyChart account, a copy of this consent can be sent to you electronically.  All virtual visits are billed to your insurance company just like a traditional visit in the office.    As this is a virtual visit, video technology does not allow for your provider to perform a traditional examination.  This may limit your provider's ability to fully assess your condition.  If your provider identifies any concerns that need to be evaluated in person or the need to arrange testing (such as labs, EKG, etc.), we will make arrangements to do so.     Although advances in technology are sophisticated, we cannot ensure that it will always work on either your end or our end.  If the connection with a video visit is poor, the visit may have to be switched to a telephone visit.  With either a video or telephone visit, we are not always able to ensure that we have a secure connection.     I need to obtain your verbal consent now.   Are you willing to proceed with your visit today?    Sarah [name] has provided verbal consent on 02/08/2021 for a virtual visit (video or telephone).   Apolonio Schneiders, FNP   Date: 02/08/2021 9:18 AM   Virtual Visit via Video Note   I, Apolonio Schneiders, connected with  Sarah [name]  ([id number], 06/02/1971) on 02/08/21 at  9:30 AM EDT by a video-enabled telemedicine application and verified that I am speaking with the correct person using two identifiers.  Location: Patient: Virtual Visit Location Patient: Home Provider: Virtual Visit Location Provider: Office/Clinic   I discussed the limitations of evaluation and management by telemedicine  and the availability of in person appointments. The patient expressed understanding and agreed to proceed.    History of Present Illness: Sarah [name] is a 50 y.o. who identifies as a female who was assigned female at birth, and is being seen today with complaints of nasal congestion and a cough for the past 4-5 days. She has been using Mucinex for the past 4 days.   She has taken two home COVID tests that have been negative.  Most recently this morning her test was negative.   Denies a history of COVID infection.   Her son tested positive for COVID last week but she has not been around him for one week and for 5 days prior to him testing positive.   She started to have a low grade fever las night.   She denies a history of asthma  She has had bronchitis in the past denies a history of pneumonia  Denies a history of inhaler use.   History of smoking, not current.    Problems:  Patient Active Problem List   Diagnosis Date Noted   Symptomatic cholelithiasis 01/16/2020   Kidney stone on left side 01/16/2020   Ventral hernia [id number]   Umbilical hernia [id number]   Diverticulosis 01/16/2020   OSA on CPAP 12/15/2019   Trigger point with back pain (Rhomboid area) 11/18/2019   Acute upper back pain  11/18/2019   Rhomboid muscle strain, initial encounter 11/18/2019   Left lumbar radiculopathy 06/14/2019   DDD (degenerative disc disease), lumbar 06/14/2019   Spondylosis 06/14/2019   Insomnia 06/14/2019   Chronic pain syndrome 10/08/2018   Class 3 severe obesity due to excess calories without serious comorbidity with body mass index (BMI) of 40.0 to 44.9 in adult [facility]) 12/15/2017   Annual physical exam 12/09/2016   Cervical facet hypertrophy 12/07/2016   Osteoarthritis of cervical spine 12/07/2016   Cervical facet syndrome (HCC) (Left) 12/07/2016   Cervical radiculitis (Left) 12/07/2016   Myofascial pain syndrome, cervical 12/07/2016   Musculoskeletal pain 12/07/2016    Neurogenic pain 12/07/2016   Chronic hip pain (Left) 03/15/2016   History of nephrolithiasis 01/26/2016   Chronic knee pain (Left) 11/17/2015   Morbid obesity ([place]) 09/20/2015   GERD (gastroesophageal reflux disease) 09/20/2015    Allergies:  Allergies  Allergen Reactions   Aspirin     bleeding   Sulfa Antibiotics Hives   Medications:  Current Outpatient Medications:    b complex vitamins tablet, Take 1 tablet by mouth daily., Disp: , Rfl:    cholecalciferol (VITAMIN D3) 25 MCG (1000 UNIT) tablet, Take 1,000 Units by mouth daily., Disp: , Rfl:    ibuprofen (ADVIL) 600 MG tablet, Take 1 tablet (600 mg total) by mouth every 8 (eight) hours as needed for moderate pain., Disp: 60 tablet, Rfl: 1   loratadine (CLARITIN) 10 MG tablet, Take 10 mg by mouth daily as needed for allergies., Disp: , Rfl:    magnesium oxide (MAG-OX) 400 MG tablet, Take 400 mg by mouth daily., Disp: , Rfl:    mupirocin ointment (BACTROBAN) 2 %, Apply 1 application topically 2 (two) times daily., Disp: 30 g, Rfl: 0   pantoprazole (PROTONIX) 40 MG tablet, Take 1 tablet (40 mg total) by mouth daily. 30 minutes before, Disp: 90 tablet, Rfl: 3  Observations/Objective: Patient is well-developed, well-nourished in no acute distress.  Resting comfortably at home.  Head is normocephalic, atraumatic.  No labored breathing.  Speech is clear and coherent with logical content.  Patient is alert and oriented at baseline.    Assessment and Plan: 1. Bronchitis  - azithromycin (ZITHROMAX) 250 MG tablet; Take 2 tablets on day 1, then 1 tablet daily on days 2 through 5  Dispense: 6 tablet; Refill: 0    Continue Mucinex OTC as directed Increase water intake   May return to work 02/10/2021 unless she tests positive for COVID prior to that, encouraged follow up at that time if she does test positive going forward   Follow Up Instructions: I discussed the assessment and treatment plan with the patient. The patient was provided  an opportunity to ask questions and all were answered. The patient agreed with the plan and demonstrated an understanding of the instructions.  A copy of instructions were sent to the patient via MyChart.  The patient was advised to call back or seek an in-person evaluation if the symptoms worsen or if the condition fails to improve as anticipated.  Time:  I spent 15 minutes with the patient via telehealth technology discussing the above problems/concerns.    Apolonio Schneiders, FNP

## 2021-02-15 ENCOUNTER — Encounter: Payer: Self-pay | Admitting: Emergency Medicine

## 2021-02-15 ENCOUNTER — Other Ambulatory Visit: Payer: Self-pay

## 2021-02-15 ENCOUNTER — Encounter: Payer: Self-pay | Admitting: Internal Medicine

## 2021-02-15 ENCOUNTER — Ambulatory Visit
Admission: EM | Admit: 2021-02-15 | Discharge: 2021-02-15 | Disposition: A | Discharge status: Home / Self Care | Payer: No Typology Code available for payment source | Attending: Emergency Medicine | Admitting: Emergency Medicine

## 2021-02-15 DIAGNOSIS — J0100 Acute maxillary sinusitis, unspecified: Secondary | ICD-10-CM | POA: Diagnosis not present

## 2021-02-15 DIAGNOSIS — R509 Fever, unspecified: Secondary | ICD-10-CM

## 2021-02-15 MED ORDER — AMOXICILLIN-POT CLAVULANATE 875-125 MG PO TABS
1.0000 | ORAL_TABLET | Freq: Two times a day (BID) | ORAL | 0 refills | Status: DC
  Filled 2021-02-15: qty 14, 7d supply, fill #0

## 2021-02-15 MED ORDER — ACETAMINOPHEN 325 MG PO TABS
650.0000 mg | ORAL_TABLET | Freq: Once | ORAL | Status: AC
  Administered 2021-02-15: 650 mg via ORAL

## 2021-02-15 MED ORDER — FLUCONAZOLE 150 MG PO TABS
150.0000 mg | ORAL_TABLET | Freq: Every day | ORAL | 0 refills | Status: DC
  Filled 2021-02-15: qty 1, 1d supply, fill #0

## 2021-02-15 NOTE — Discharge Instructions (Addendum)
Go to the emergency department if you have acute worsening symptoms.    Take the Augmentin as directed.  Your COVID and Flu tests are pending.  You should self quarantine until the test results are back.    Take Tylenol or ibuprofen as needed for fever or discomfort.  Rest and keep yourself hydrated.    Follow-up with your primary care provider.

## 2021-02-15 NOTE — ED Triage Notes (Addendum)
Patient c/o nasal congestion and fever x 1 week.   Patient denies SOB.   Patient endorses a fever of 100.5 at it's highest.  Patient endorses chest congestion. Patient endorses productive cough w/ "yellow" sputum.   Patient endorses generalized body aches.   Patient has taken 4 at home COVID test with negative results.   Patient was prescribed a azithromycin last week with some relief of symptoms initially and then symptoms reappeared after medication finished.   Patient has taken Nyquil and Mucinex w/ no relief of symptoms.

## 2021-02-15 NOTE — ED Provider Notes (Signed)
[name]    CSN: [id number] Arrival date & time: 02/15/21  0911      History   Chief Complaint Chief Complaint  Patient presents with   Nasal Congestion   Fever     HPI Sarah [name] is a 50 y.o. female.  Patient presents with 1 week history of fever, aches, sore throat, congestion, cough productive of yellow sputum.  T-max 101.  Negative COVID test at home last week.  She denies shortness of breath, vomiting, diarrhea, or other symptoms.  Treatment at home with Mucinex and NyQuil but none taken today.  Patient had a video visit with [place] on 02/08/2021; diagnosed with bronchitis; treated with Zithromax.  Her medical history includes morbid obesity, chronic pain syndrome, osteoarthritis, neurogenic pain, radiculopathy, kidney stones, diverticulosis, GERD.  The history is provided by the patient and medical records.   Past Medical History:  Diagnosis Date   Allergy    Chicken pox    Chronic pain    GERD (gastroesophageal reflux disease)    History of kidney stones    Hyperlipidemia    PONV (postoperative nausea and vomiting)    Shingles    age 48   Sleep apnea    uses cpap machine    Surgical site infection 2022    Patient Active Problem List   Diagnosis Date Noted   Symptomatic cholelithiasis 01/16/2020   Kidney stone on left side 01/16/2020   Ventral hernia 01/16/2020   Umbilical hernia 01/16/2020   Diverticulosis 01/16/2020   OSA on CPAP 12/15/2019   Trigger point with back pain (Rhomboid area) 11/18/2019   Acute upper back pain 11/18/2019   Rhomboid muscle strain, initial encounter 11/18/2019   Left lumbar radiculopathy 06/14/2019   DDD (degenerative disc disease), lumbar 06/14/2019   Spondylosis 06/14/2019   Insomnia 06/14/2019   Chronic pain syndrome 10/08/2018   Class 3 severe obesity due to excess calories without serious comorbidity with body mass index (BMI) of 40.0 to 44.9 in adult [facility]) 12/15/2017   Annual physical exam 12/09/2016    Cervical facet hypertrophy 12/07/2016   Osteoarthritis of cervical spine 12/07/2016   Cervical facet syndrome (HCC) (Left) 12/07/2016   Cervical radiculitis (Left) 12/07/2016   Myofascial pain syndrome, cervical 12/07/2016   Musculoskeletal pain 12/07/2016   Neurogenic pain 12/07/2016   Chronic hip pain (Left) 03/15/2016   History of nephrolithiasis 01/26/2016   Chronic knee pain (Left) 11/17/2015   Morbid obesity (HCC) 09/20/2015   GERD (gastroesophageal reflux disease) 09/20/2015    Past Surgical History:  Procedure Laterality Date   ABDOMINAL HYSTERECTOMY  01/2001   DUB h/o abnormal pap ovaries intact age early 85s abnormal pap   CESAREAN SECTION  12/1987   CESAREAN SECTION  05/1993   CHOLECYSTECTOMY     COLONOSCOPY WITH PROPOFOL N/A 01/29/2016   Procedure: COLONOSCOPY WITH PROPOFOL;  Surgeon: Scot Jun, MD;  Location: [facility] ENDOSCOPY;  Service: Endoscopy;  Laterality: N/A;   HERNIA REPAIR  2022   LITHOTRIPSY  2016   UMBILICAL HERNIA REPAIR N/A 11/12/2020   Procedure: HERNIA REPAIR UMBILICAL ADULT, open;  Surgeon: Henrene Dodge, MD;  Location: [facility] ORS;  Service: General;  Laterality: N/A;    OB History   No obstetric history on file.      Home Medications    Prior to Admission medications   Medication Sig Start Date End Date Taking? Authorizing Provider  amoxicillin-clavulanate (AUGMENTIN) 875-125 MG tablet Take 1 tablet by mouth every 12 (twelve) hours. 02/15/21  Yes Sharion Balloon, NP  b complex vitamins tablet Take 1 tablet by mouth daily.   Yes [provider]  cholecalciferol (VITAMIN D3) 25 MCG (1000 UNIT) tablet Take 1,000 Units by mouth daily.   Yes [provider]  fluconazole (DIFLUCAN) 150 MG tablet Take 1 tablet (150 mg total) by mouth daily. Take one tablet today.  May repeat in 3 days. 02/15/21  Yes Sharion Balloon, NP  pantoprazole (PROTONIX) 40 MG tablet Take 1 tablet (40 mg total) by mouth daily. 30 minutes before 01/15/21  Yes  McLean-Scocuzza, Nino Glow, MD  COVID-19 At Home Antigen Test [facility] COVID-19 HOME TEST) KIT use as directed 02/08/21   Letta Median, RPH  ibuprofen (ADVIL) 600 MG tablet Take 1 tablet (600 mg total) by mouth every 8 (eight) hours as needed for moderate pain. 11/12/20   Olean Ree, MD  loratadine (CLARITIN) 10 MG tablet Take 10 mg by mouth daily as needed for allergies.    [provider]  magnesium oxide (MAG-OX) 400 MG tablet Take 400 mg by mouth daily.    [provider]  mupirocin ointment (BACTROBAN) 2 % Apply 1 application topically 2 (two) times daily. 01/15/21   McLean-Scocuzza, Nino Glow, MD    Family History Family History  Problem Relation Age of Onset   Breast cancer Mother 23   Cancer Mother        breast   Colon cancer Father 36   Lung cancer Father    Diabetes Father    Hypertension Paternal Grandfather    Breast cancer Maternal Grandmother        30's   Cancer Maternal Grandmother        breast    Cancer Cousin        breast     Social History Social History   Tobacco Use   Smoking status: Former    Packs/day: 0.50    Years: 10.00    Pack years: 5.00    Types: Cigarettes    Quit date: 06/27/2010    Years since quitting: 10.6   Smokeless tobacco: Never   Tobacco comments:    quit 2015---currently using e cig  Vaping Use   Vaping Use: Former   Quit date: 02/27/2017   Devices: quit 2018  Substance Use Topics   Alcohol use: Yes    Alcohol/week: 0.0 standard drinks    Comment: occasional and very rarely    Drug use: No     Allergies   Aspirin and Sulfa antibiotics   Review of Systems Review of Systems  Constitutional:  Positive for fatigue and fever. Negative for chills.  HENT:  Positive for congestion, postnasal drip, rhinorrhea and sore throat. Negative for ear pain.   Respiratory:  Positive for cough. Negative for shortness of breath.   Cardiovascular:  Negative for chest pain and palpitations.  Gastrointestinal:  Negative  for abdominal pain, diarrhea and vomiting.  Skin:  Negative for color change and rash.  All other systems reviewed and are negative.   Physical Exam Triage Vital Signs ED Triage Vitals  Enc Vitals Group     BP      Pulse      Resp      Temp      Temp src      SpO2      Weight      Height      Head Circumference      Peak Flow  Pain Score      Pain Loc      Pain Edu?      Excl. in [place]?    No data found.  Updated Vital Signs BP 134/83 (BP Location: Left Arm)   Pulse (!) 106   Temp (!) 101.1 F (38.4 C) (Oral)   Resp 18   SpO2 97%   Visual Acuity Right Eye Distance:   Left Eye Distance:   Bilateral Distance:    Right Eye Near:   Left Eye Near:    Bilateral Near:     Physical Exam Vitals and nursing note reviewed.  Constitutional:      General: She is not in acute distress.    Appearance: She is well-developed.  HENT:     Head: Normocephalic and atraumatic.     Right Ear: Tympanic membrane normal.     Left Ear: Tympanic membrane normal.     Nose: Congestion and rhinorrhea present.     Mouth/Throat:     Mouth: Mucous membranes are moist.     Pharynx: Oropharynx is clear.  Eyes:     Conjunctiva/sclera: Conjunctivae normal.  Cardiovascular:     Rate and Rhythm: Normal rate and regular rhythm.     Heart sounds: Normal heart sounds.  Pulmonary:     Effort: Pulmonary effort is normal. No respiratory distress.     Breath sounds: Normal breath sounds.  Abdominal:     Palpations: Abdomen is soft.     Tenderness: There is no abdominal tenderness.  Musculoskeletal:     Cervical back: Neck supple.  Skin:    General: Skin is warm and dry.  Neurological:     General: No focal deficit present.     Mental Status: She is alert and oriented to person, place, and time.     Gait: Gait normal.  Psychiatric:        Mood and Affect: Mood normal.        Behavior: Behavior normal.     UC Treatments / Results  Labs (all labs ordered are listed, but only  abnormal results are displayed) Labs Reviewed  COVID-19, FLU A+B NAA  CBC WITH DIFFERENTIAL/PLATELET  BASIC METABOLIC PANEL    EKG   Radiology No results found.  Procedures Procedures (including critical care time)  Medications Ordered in UC Medications  acetaminophen (TYLENOL) tablet 650 mg (650 mg Oral Given 02/15/21 0934)    Initial Impression / Assessment and Plan / UC Course  I have reviewed the triage vital signs and the nursing notes.  Pertinent labs & imaging results that were available during my care of the patient were reviewed by me and considered in my medical decision making (see chart for details).  Acute sinusitis, Fever.  CBC, BMP pending.  Patient declines chest x-ray.  COVID and flu pending.  Instructed patient to self quarantine until the test results are back.  Treating sinus infection with Augmentin.  Per patient request, 1 tablet of Diflucan prescribed also.  Discussed Tylenol or ibuprofen as needed for fever or discomfort.  Also discussed rest and hydration.  Instructed her to follow-up with her PCP.   Final Clinical Impressions(s) / UC Diagnoses   Final diagnoses:  Acute non-recurrent maxillary sinusitis  Fever, unspecified     Discharge Instructions      Go to the emergency department if you have acute worsening symptoms.    Take the Augmentin as directed.  Your COVID and Flu tests are pending.  You should self quarantine until  the test results are back.    Take Tylenol or ibuprofen as needed for fever or discomfort.  Rest and keep yourself hydrated.    Follow-up with your primary care provider.         ED Prescriptions     Medication Sig Dispense Auth. Provider   amoxicillin-clavulanate (AUGMENTIN) 875-125 MG tablet Take 1 tablet by mouth every 12 (twelve) hours. 14 tablet Sharion Balloon, NP   fluconazole (DIFLUCAN) 150 MG tablet Take 1 tablet (150 mg total) by mouth daily. Take one tablet today.  May repeat in 3 days. 1 tablet Sharion Balloon, NP      PDMP not reviewed this encounter.   Sharion Balloon, NP 02/15/21 1006

## 2021-02-16 LAB — CBC WITH DIFFERENTIAL/PLATELET
Basophils Absolute: 0 10*3/uL (ref 0.0–0.2)
Basos: 1 %
EOS (ABSOLUTE): 0.1 10*3/uL (ref 0.0–0.4)
Eos: 2 %
Hematocrit: 36.5 % (ref 34.0–46.6)
Hemoglobin: 12.7 g/dL (ref 11.1–15.9)
Immature Grans (Abs): 0.1 10*3/uL (ref 0.0–0.1)
Immature Granulocytes: 1 %
Lymphocytes Absolute: 0.7 10*3/uL (ref 0.7–3.1)
Lymphs: 11 %
MCH: 29.5 pg (ref 26.6–33.0)
MCHC: 34.8 g/dL (ref 31.5–35.7)
MCV: 85 fL (ref 79–97)
Monocytes Absolute: 0.6 10*3/uL (ref 0.1–0.9)
Monocytes: 9 %
Neutrophils Absolute: 4.9 10*3/uL (ref 1.4–7.0)
Neutrophils: 76 %
Platelets: 212 10*3/uL (ref 150–450)
RBC: 4.31 x10E6/uL (ref 3.77–5.28)
RDW: 13.5 % (ref 11.7–15.4)
WBC: 6.4 10*3/uL (ref 3.4–10.8)

## 2021-02-16 LAB — COVID-19, FLU A+B NAA
Influenza A, NAA: NOT DETECTED
Influenza B, NAA: NOT DETECTED
SARS-CoV-2, NAA: DETECTED — AB

## 2021-02-16 LAB — BASIC METABOLIC PANEL
BUN/Creatinine Ratio: 23 (ref 9–23)
BUN: 16 mg/dL (ref 6–24)
CO2: 20 mmol/L (ref 20–29)
Calcium: 9.2 mg/dL (ref 8.7–10.2)
Chloride: 103 mmol/L (ref 96–106)
Creatinine, Ser: 0.71 mg/dL (ref 0.57–1.00)
Glucose: 97 mg/dL (ref 65–99)
Potassium: 4.3 mmol/L (ref 3.5–5.2)
Sodium: 138 mmol/L (ref 134–144)
eGFR: 102 mL/min/{1.73_m2} (ref >59)

## 2021-02-16 NOTE — Telephone Encounter (Signed)
Called and spoke to [place]. Envy verbalized understanding and had no further questions.

## 2021-02-16 NOTE — Telephone Encounter (Signed)
Ok to take probiotics daily while on abx and for two weeks after completing abx. - to help prevent bowel issues.  Please confirm doing ok.  Reviewed - she was seen in UC.  If persistent or worsening problems, needs to be reevaluated - may need cxr, etc

## 2021-04-02 ENCOUNTER — Telehealth: Payer: Self-pay | Admitting: Internal Medicine

## 2021-04-02 NOTE — Telephone Encounter (Signed)
Lft pt vm to call [place] to sch mammo.thanks

## 2021-04-20 ENCOUNTER — Encounter: Payer: Self-pay | Admitting: Internal Medicine

## 2021-04-21 ENCOUNTER — Other Ambulatory Visit: Payer: Self-pay

## 2021-05-10 ENCOUNTER — Ambulatory Visit
Admission: RE | Admit: 2021-05-10 | Discharge: 2021-05-10 | Disposition: A | Discharge status: Home / Self Care | Payer: No Typology Code available for payment source | Source: Ambulatory Visit | Attending: Internal Medicine | Admitting: Internal Medicine

## 2021-05-10 ENCOUNTER — Other Ambulatory Visit: Payer: Self-pay

## 2021-05-10 DIAGNOSIS — Z1231 Encounter for screening mammogram for malignant neoplasm of breast: Secondary | ICD-10-CM | POA: Insufficient documentation

## 2021-05-14 ENCOUNTER — Other Ambulatory Visit: Payer: Self-pay

## 2021-05-14 ENCOUNTER — Telehealth: Payer: No Typology Code available for payment source | Admitting: Internal Medicine

## 2021-05-14 ENCOUNTER — Encounter: Payer: Self-pay | Admitting: Internal Medicine

## 2021-05-14 VITALS — Ht 62.84 in | Wt 229.0 lb

## 2021-05-14 NOTE — Patient Instructions (Signed)
[facility]  Tirzepatide Injection/mounjaro What is this medication? TIRZEPATIDE (tir ZEP a tide) treats type 2 diabetes. It works by increasing insulin levels in your body, which decreases your blood sugar (glucose). Changes to diet and exercise are often combined with this medication. This medicine may be used for other purposes; ask your health care provider or pharmacist if you have questions. COMMON BRAND NAME(S): MOUNJARO What should I tell my care team before I take this medication? They need to know if you have any of these conditions: Endocrine tumors (MEN 2) or if someone in your family had these tumors Eye disease, vision problems Gallbladder disease History of pancreatitis Kidney disease Stomach or intestine problems Thyroid cancer or if someone in your family had thyroid cancer An unusual or allergic reaction to tirzepatide, other medications, foods, dyes, or preservatives Pregnant or trying to get pregnant Breast-feeding How should I use this medication? This medication is injected under the skin. You will be taught how to prepare and give it. It is given once every week (every 7 days). Keep taking it unless your health care provider tells you to stop. If you use this medication with insulin, you should inject this medication and the insulin separately. Do not mix them together. Do not give the injections right next to each other. Change (rotate) injection sites with each injection. This medication comes with INSTRUCTIONS FOR USE. Ask your pharmacist for directions on how to use this medication. Read the information carefully. Talk to your pharmacist or care team if you have questions. It is important that you put your used needles and syringes in a special sharps container. Do not put them in a trash can. If you do not have a sharps container, call your pharmacist or care team to get one. A special MedGuide will be given to you by the pharmacist with each prescription and refill. Be  sure to read this information carefully each time. Talk to your care team about the use of this medication in children. Special care may be needed. Overdosage: If you think you have taken too much of this medicine contact a poison control center or emergency room at once. NOTE: This medicine is only for you. Do not share this medicine with others. What if I miss a dose? If you miss a dose, take it as soon as you can unless it is more than 4 days (96 hours) late. If it is more than 4 days late, skip the missed dose. Take the next dose at the normal time. Do not take 2 doses within 3 days of each other. What may interact with this medication? Alcohol containing beverages Antiviral medications for HIV or AIDS Aspirin and aspirin-like medications Beta-blockers like atenolol, metoprolol, propranolol Certain medications for blood pressure, heart disease, irregular heart beat Chromium Clonidine Diuretics Female hormones, such as estrogens or progestins, birth control pills Fenofibrate Gemfibrozil Guanethidine Isoniazid Lanreotide Female hormones or anabolic steroids MAOIs like Carbex, Eldepryl, Marplan, Nardil, and Parnate Medications for weight loss Medications for allergies, asthma, cold, or cough Medications for depression, anxiety, or psychotic disturbances Niacin Nicotine NSAIDs, medications for pain and inflammation, like ibuprofen or naproxen Octreotide Other medications for diabetes, like glyburide, glipizide, or glimepiride Pasireotide Pentamidine Phenytoin Probenecid Quinolone antibiotics such as ciprofloxacin, levofloxacin, ofloxacin Reserpine Some herbal dietary supplements Steroid medications such as prednisone or cortisone Sulfamethoxazole; trimethoprim Thyroid hormones Warfarin This list may not describe all possible interactions. Give your health care provider a list of all the medicines, herbs, non-prescription drugs, or dietary  supplements you use. Also tell them if  you smoke, drink alcohol, or use illegal drugs. Some items may interact with your medicine. What should I watch for while using this medication? Visit your care team for regular checks on your progress. Drink plenty of fluids while taking this medication. Check with your care team if you get an attack of severe diarrhea, nausea, and vomiting. The loss of too much body fluid can make it dangerous for you to take this medication. A test called the HbA1C (A1C) will be monitored. This is a simple blood test. It measures your blood sugar control over the last 2 to 3 months. You will receive this test every 3 to 6 months. Learn how to check your blood sugar. Learn the symptoms of low and high blood sugar and how to manage them. Always carry a quick-source of sugar with you in case you have symptoms of low blood sugar. Examples include hard sugar candy or glucose tablets. Make sure others know that you can choke if you eat or drink when you develop serious symptoms of low blood sugar, such as seizures or unconsciousness. They must get medical help at once. Tell your care team if you have high blood sugar. You might need to change the dose of your medication. If you are sick or exercising more than usual, you might need to change the dose of your medication. Do not skip meals. Ask your care team if you should avoid alcohol. Many nonprescription cough and cold products contain sugar or alcohol. These can affect blood sugar. Pens should never be shared. Even if the needle is changed, sharing may result in passing of viruses like hepatitis or HIV. Wear a medical ID bracelet or chain, and carry a card that describes your disease and details of your medication and dosage times. Birth control may not work properly while you are taking this medication. If you take birth control pills by mouth, your care team may recommend another type of birth control for 4 weeks after you start this medication and for 4 weeks after  each increase in your dose of this medication. Ask your care team which birth control methods you should use. What side effects may I notice from receiving this medication? Side effects that you should report to your care team as soon as possible: Allergic reactions--skin rash, itching, hives, swelling of the face, lips, tongue, or throat Change in vision Dehydration--increased thirst, dry mouth, feeling faint or lightheaded, headache, dark yellow or brown urine Gallbladder problems--severe stomach pain, nausea, vomiting, fever Kidney injury--decrease in the amount of urine, swelling of the ankles, hands, or feet Pancreatitis--severe stomach pain that spreads to your back or gets worse after eating or when touched, fever, nausea, vomiting Thyroid cancer--new mass or lump in the neck, pain or trouble swallowing, trouble breathing, hoarseness Side effects that usually do not require medical attention (report these to your care team if they continue or are bothersome): Constipation Diarrhea Loss of Appetite Nausea Stomach pain Upset stomach Vomiting This list may not describe all possible side effects. Call your doctor for medical advice about side effects. You may report side effects to FDA at 1-800-FDA-1088. Where should I keep my medication? Keep out of the reach of children and pets. Refrigeration (preferred): Store unopened pens in a refrigerator between 2 and 8 degrees C (36 and 46 degrees F). Keep it in the original carton until you are ready to take it. Do not freeze or use if the medication has  been frozen. Protect from light. Get rid of any unused medication after the expiration date on the label. Room Temperature: The pen may be stored at room temperature below 30 degrees C (86 degrees F) for up to a total of 21 days if needed. Protect from light. Avoid exposure to extreme heat. If it is stored at room temperature, throw away any unused medication after 21 days or after it expires,  whichever is first. The pen has glass parts. Handle it carefully. If you drop the pen on a hard surface, do not use it. Use a new pen for your injection. To get rid of medications that are no longer needed or have expired: Take the medication to a medication take-back program. Check with your pharmacy or law enforcement to find a location. If you cannot return the medication, ask your pharmacist or care team how to get rid of this medication safely. NOTE: This sheet is a summary. It may not cover all possible information. If you have questions about this medicine, talk to your doctor, pharmacist, or health care provider.  2022 Elsevier/Gold Standard (2020-11-11 00:00:00)  Semaglutide Injection/ozempic What is this medication? SEMAGLUTIDE (SEM a GLOO tide) treats type 2 diabetes. It works by increasing insulin levels in your body, which decreases your blood sugar (glucose). It also reduces the amount of sugar released into the blood and slows down your digestion. It can also be used to lower the risk of heart attack and stroke in people with type 2 diabetes. Changes to diet and exercise are often combined with this medication. This medicine may be used for other purposes; ask your health care provider or pharmacist if you have questions. COMMON BRAND NAME(S): OZEMPIC What should I tell my care team before I take this medication? They need to know if you have any of these conditions: Endocrine tumors (MEN 2) or if someone in your family had these tumors Eye disease, vision problems History of pancreatitis Kidney disease Stomach problems Thyroid cancer or if someone in your family had thyroid cancer An unusual or allergic reaction to semaglutide, other medications, foods, dyes, or preservatives Pregnant or trying to get pregnant Breast-feeding How should I use this medication? This medication is for injection under the skin of your upper leg (thigh), stomach area, or upper arm. It is given  once every week (every 7 days). You will be taught how to prepare and give this medication. Use exactly as directed. Take your medication at regular intervals. Do not take it more often than directed. If you use this medication with insulin, you should inject this medication and the insulin separately. Do not mix them together. Do not give the injections right next to each other. Change (rotate) injection sites with each injection. It is important that you put your used needles and syringes in a special sharps container. Do not put them in a trash can. If you do not have a sharps container, call your pharmacist or care team to get one. A special MedGuide will be given to you by the pharmacist with each prescription and refill. Be sure to read this information carefully each time. This medication comes with INSTRUCTIONS FOR USE. Ask your pharmacist for directions on how to use this medication. Read the information carefully. Talk to your pharmacist or care team if you have questions. Talk to your care team about the use of this medication in children. Special care may be needed. Overdosage: If you think you have taken too much of this medicine  contact a poison control center or emergency room at once. NOTE: This medicine is only for you. Do not share this medicine with others. What if I miss a dose? If you miss a dose, take it as soon as you can within 5 days after the missed dose. Then take your next dose at your regular weekly time. If it has been longer than 5 days after the missed dose, do not take the missed dose. Take the next dose at your regular time. Do not take double or extra doses. If you have questions about a missed dose, contact your care team for advice. What may interact with this medication? Other medications for diabetes Many medications may cause changes in blood sugar, these include: Alcohol containing beverages Antiviral medications for HIV or AIDS Aspirin and aspirin-like  medications Certain medications for blood pressure, heart disease, irregular heart beat Chromium Diuretics Female hormones, such as estrogens or progestins, birth control pills Fenofibrate Gemfibrozil Isoniazid Lanreotide Female hormones or anabolic steroids MAOIs like Carbex, Eldepryl, Marplan, Nardil, and Parnate Medications for weight loss Medications for allergies, asthma, cold, or cough Medications for depression, anxiety, or psychotic disturbances Niacin Nicotine NSAIDs, medications for pain and inflammation, like ibuprofen or naproxen Octreotide Pasireotide Pentamidine Phenytoin Probenecid Quinolone antibiotics such as ciprofloxacin, levofloxacin, ofloxacin Some herbal dietary supplements Steroid medications such as prednisone or cortisone Sulfamethoxazole; trimethoprim Thyroid hormones Some medications can hide the warning symptoms of low blood sugar (hypoglycemia). You may need to monitor your blood sugar more closely if you are taking one of these medications. These include: Beta-blockers, often used for high blood pressure or heart problems (examples include atenolol, metoprolol, propranolol) Clonidine Guanethidine Reserpine This list may not describe all possible interactions. Give your health care provider a list of all the medicines, herbs, non-prescription drugs, or dietary supplements you use. Also tell them if you smoke, drink alcohol, or use illegal drugs. Some items may interact with your medicine. What should I watch for while using this medication? Visit your care team for regular checks on your progress. Drink plenty of fluids while taking this medication. Check with your care team if you get an attack of severe diarrhea, nausea, and vomiting. The loss of too much body fluid can make it dangerous for you to take this medication. A test called the HbA1C (A1C) will be monitored. This is a simple blood test. It measures your blood sugar control over the last 2 to  3 months. You will receive this test every 3 to 6 months. Learn how to check your blood sugar. Learn the symptoms of low and high blood sugar and how to manage them. Always carry a quick-source of sugar with you in case you have symptoms of low blood sugar. Examples include hard sugar candy or glucose tablets. Make sure others know that you can choke if you eat or drink when you develop serious symptoms of low blood sugar, such as seizures or unconsciousness. They must get medical help at once. Tell your care team if you have high blood sugar. You might need to change the dose of your medication. If you are sick or exercising more than usual, you might need to change the dose of your medication. Do not skip meals. Ask your care team if you should avoid alcohol. Many nonprescription cough and cold products contain sugar or alcohol. These can affect blood sugar. Pens should never be shared. Even if the needle is changed, sharing may result in passing of viruses like hepatitis or HIV.  Wear a medical ID bracelet or chain, and carry a card that describes your disease and details of your medication and dosage times. Do not become pregnant while taking this medication. Women should inform their care team if they wish to become pregnant or think they might be pregnant. There is a potential for serious side effects to an unborn child. Talk to your care team for more information. What side effects may I notice from receiving this medication? Side effects that you should report to your care team as soon as possible: Allergic reactions--skin rash, itching, hives, swelling of the face, lips, tongue, or throat Change in vision Dehydration--increased thirst, dry mouth, feeling faint or lightheaded, headache, dark yellow or brown urine Gallbladder problems--severe stomach pain, nausea, vomiting, fever Heart palpitations--rapid, pounding, or irregular heartbeat Kidney injury--decrease in the amount of urine, swelling  of the ankles, hands, or feet Pancreatitis--severe stomach pain that spreads to your back or gets worse after eating or when touched, fever, nausea, vomiting Thyroid cancer--new mass or lump in the neck, pain or trouble swallowing, trouble breathing, hoarseness Side effects that usually do not require medical attention (report to your care team if they continue or are bothersome): Diarrhea Loss of appetite Nausea Stomach pain Vomiting This list may not describe all possible side effects. Call your doctor for medical advice about side effects. You may report side effects to FDA at 1-800-FDA-1088. Where should I keep my medication? Keep out of the reach of children. Store unopened pens in a refrigerator between 2 and 8 degrees C (36 and 46 degrees F). Do not freeze. Protect from light and heat. After you first use the pen, it can be stored for 56 days at room temperature between 15 and 30 degrees C (59 and 86 degrees F) or in a refrigerator. Throw away your used pen after 56 days or after the expiration date, whichever comes first. Do not store your pen with the needle attached. If the needle is left on, medication may leak from the pen. NOTE: This sheet is a summary. It may not cover all possible information. If you have questions about this medicine, talk to your doctor, pharmacist, or health care provider.  2022 Elsevier/Gold Standard (2020-09-17 00:00:00)

## 2021-05-14 NOTE — Progress Notes (Signed)
Virtual Visit via Video Note  I connected with Sarah [name]  on 05/14/21 at  3:30 PM EST by a video enabled telemedicine application and verified that I am speaking with the correct person using two identifiers.  Location patient: home, [place] Location provider:work or home office Persons participating in the virtual visit: patient, provider  I discussed the limitations of evaluation and management by telemedicine and the availability of in person appointments. The patient expressed understanding and agreed to proceed.   HPI:  Acute telemedicine visit for : Obesity disc mounjaro co work on and lost a lot of weight  Reviewed side effects n/v/d/constipation/thyroid cancer if FH pt not sure if wants to try this class of medications   ROS: See pertinent positives and negatives per HPI.  Past Medical History:  Diagnosis Date   Allergy    Chicken pox    Chronic pain    GERD (gastroesophageal reflux disease)    History of kidney stones    Hyperlipidemia    PONV (postoperative nausea and vomiting)    Shingles    age 50   Sleep apnea    uses cpap machine    Surgical site infection 2022    Past Surgical History:  Procedure Laterality Date   ABDOMINAL HYSTERECTOMY  01/2001   DUB h/o abnormal pap ovaries intact age early 79s abnormal pap   CESAREAN SECTION  12/1987   CESAREAN SECTION  05/1993   CHOLECYSTECTOMY     COLONOSCOPY WITH PROPOFOL N/A 01/29/2016   Procedure: COLONOSCOPY WITH PROPOFOL;  Surgeon: Manya Silvas, MD;  Location: [facility];  Service: Endoscopy;  Laterality: N/A;   HERNIA REPAIR  2022   LITHOTRIPSY  [id number]   UMBILICAL HERNIA REPAIR N/A 11/12/2020   Procedure: HERNIA REPAIR UMBILICAL ADULT, open;  Surgeon: Olean Ree, MD;  Location: [facility];  Service: General;  Laterality: N/A;     Current Outpatient Medications:    b complex vitamins tablet, Take 1 tablet by mouth daily., Disp: , Rfl:    cholecalciferol (VITAMIN D3) 25 MCG (1000 UNIT) tablet, Take  1,000 Units by mouth daily., Disp: , Rfl:    ibuprofen (ADVIL) 600 MG tablet, Take 1 tablet (600 mg total) by mouth every 8 (eight) hours as needed for moderate pain., Disp: 60 tablet, Rfl: 1   loratadine (CLARITIN) 10 MG tablet, Take 10 mg by mouth daily as needed for allergies., Disp: , Rfl:    magnesium oxide (MAG-OX) 400 MG tablet, Take 400 mg by mouth daily., Disp: , Rfl:    mupirocin ointment (BACTROBAN) 2 %, Apply 1 application topically 2 (two) times daily., Disp: 30 g, Rfl: 0   pantoprazole (PROTONIX) 40 MG tablet, Take 1 tablet (40 mg total) by mouth daily. 30 minutes before, Disp: 90 tablet, Rfl: 3   amoxicillin-clavulanate (AUGMENTIN) 875-125 MG tablet, Take 1 tablet by mouth every 12 (twelve) hours., Disp: 14 tablet, Rfl: 0   COVID-19 At Home Antigen Test (CARESTART COVID-19 HOME TEST) KIT, use as directed (Patient not taking: Reported on 05/14/2021), Disp: 2 kit, Rfl: 0   fluconazole (DIFLUCAN) 150 MG tablet, Take 1 tablet (150 mg total) by mouth daily. Take one tablet today.  May repeat in 3 days. (Patient not taking: Reported on 05/14/2021), Disp: 1 tablet, Rfl: 0  EXAM:  VITALS per patient if applicable:  GENERAL: alert, oriented, appears well and in no acute distress  HEENT: atraumatic, conjunttiva clear, no obvious abnormalities on inspection of external nose and ears  NECK: normal movements of  the head and neck  LUNGS: on inspection no signs of respiratory distress, breathing rate appears normal, no obvious gross SOB, gasping or wheezing  CV: no obvious cyanosis  MS: moves all visible extremities without noticeable abnormality  PSYCH/NEURO: pleasant and cooperative, no obvious depression or anxiety, speech and thought processing grossly intact  ASSESSMENT AND PLAN:  Discussed the following assessment and plan:  Morbid obesity with BMI of 40.0-44.9, adult ([place]) Disc adipex declines due to h/o palpitations/pvcs, not sure about ozempic/mounjaro due to side effects  thyroid cancer and has FH cancer  Disc plenity   -we discussed possible serious and likely etiologies, options for evaluation and workup, limitations of telemedicine visit vs in person visit, treatment, treatment risks and precautions. Pt is agreeable to treatment via telemedicine at this moment.      I discussed the assessment and treatment plan with the patient. The patient was provided an opportunity to ask questions and all were answered. The patient agreed with the plan and demonstrated an understanding of the instructions.    No charge  Delorise Jackson, MD

## 2021-06-15 ENCOUNTER — Encounter: Payer: Self-pay | Admitting: Internal Medicine

## 2021-06-18 ENCOUNTER — Other Ambulatory Visit: Payer: Self-pay

## 2021-06-18 MED ORDER — OZEMPIC (0.25 OR 0.5 MG/DOSE) 2 MG/1.5ML ~~LOC~~ SOPN
0.2500 mg | PEN_INJECTOR | SUBCUTANEOUS | 2 refills | Status: DC
  Filled 2021-06-18: qty 1.5, 56d supply, fill #0

## 2021-06-18 NOTE — Addendum Note (Signed)
Addended by: Orland Mustard on: 06/18/2021 09:47 AM   Modules accepted: Orders

## 2021-07-15 ENCOUNTER — Encounter: Payer: Self-pay | Admitting: Gastroenterology

## 2021-07-15 NOTE — H&P (Signed)
Procedure canceled due to incomplete prep. Pt still with liquid brown stool. Will rescheduled with alternative prep.  Annamaria Helling, DO [facility]

## 2021-07-16 ENCOUNTER — Ambulatory Visit: Payer: No Typology Code available for payment source | Admitting: Certified Registered Nurse Anesthetist

## 2021-07-16 ENCOUNTER — Other Ambulatory Visit: Payer: Self-pay

## 2021-07-16 ENCOUNTER — Ambulatory Visit
Admission: RE | Admit: 2021-07-16 | Discharge: 2021-07-16 | Disposition: A | Discharge status: Home / Self Care | Payer: No Typology Code available for payment source | Attending: Gastroenterology | Admitting: Gastroenterology

## 2021-07-16 ENCOUNTER — Encounter: Payer: Self-pay | Admitting: Gastroenterology

## 2021-07-16 ENCOUNTER — Encounter
Admission: RE | Disposition: A | Discharge status: Home / Self Care | Payer: Self-pay | Source: Home / Self Care | Attending: Gastroenterology

## 2021-07-16 DIAGNOSIS — Z86010 Personal history of colonic polyps: Secondary | ICD-10-CM | POA: Diagnosis not present

## 2021-07-16 DIAGNOSIS — Z8371 Family history of colonic polyps: Secondary | ICD-10-CM | POA: Diagnosis not present

## 2021-07-16 DIAGNOSIS — Z538 Procedure and treatment not carried out for other reasons: Secondary | ICD-10-CM | POA: Diagnosis not present

## 2021-07-16 SURGERY — COLONOSCOPY WITH PROPOFOL
Anesthesia: General

## 2021-07-16 MED ORDER — NA SULFATE-K SULFATE-MG SULF 17.5-3.13-1.6 GM/177ML PO SOLN
ORAL | 0 refills | Status: DC
  Filled 2021-07-16: qty 354, 1d supply, fill #0

## 2021-07-16 MED ORDER — LIDOCAINE HCL (PF) 2 % IJ SOLN
INTRAMUSCULAR | Status: AC
  Filled 2021-07-16: qty 5

## 2021-07-16 MED ORDER — PROPOFOL 500 MG/50ML IV EMUL
INTRAVENOUS | Status: AC
  Filled 2021-07-16: qty 50

## 2021-07-16 MED ORDER — SODIUM CHLORIDE 0.9 % IV SOLN: INTRAVENOUS | Status: DC

## 2021-07-18 ENCOUNTER — Encounter: Payer: Self-pay | Admitting: Gastroenterology

## 2021-07-18 NOTE — H&P (Signed)
Pre-Procedure H&P   Patient ID: Sarah [name] is a 51 y.o. female.  Gastroenterology Provider: Annamaria Helling, DO  Referring Provider: Dr. Terese Door PCP: McLean-Scocuzza, Nino Glow, MD  Date: 07/19/2021  HPI Ms. Sarah [name] is a 51 y.o. female who presents today for Colonoscopy for Surveillance-personal history colon polyps; family history of colon cancer (father at age 71).   Past Medical History:  Diagnosis Date   Allergy    Chicken pox    Chronic pain    GERD (gastroesophageal reflux disease)    History of kidney stones    Hyperlipidemia    PONV (postoperative nausea and vomiting)    Shingles    age 53   Sleep apnea    uses cpap machine    Surgical site infection 2022    Past Surgical History:  Procedure Laterality Date   ABDOMINAL HYSTERECTOMY  01/2001   DUB h/o abnormal pap ovaries intact age early 24s abnormal pap   CESAREAN SECTION  12/1987   CESAREAN SECTION  05/1993   CHOLECYSTECTOMY     COLONOSCOPY WITH PROPOFOL N/A 01/29/2016   Procedure: COLONOSCOPY WITH PROPOFOL;  Surgeon: Manya Silvas, MD;  Location: [facility];  Service: Endoscopy;  Laterality: N/A;   HERNIA REPAIR  2022   LITHOTRIPSY  [id number]   UMBILICAL HERNIA REPAIR N/A 11/12/2020   Procedure: HERNIA REPAIR UMBILICAL ADULT, open;  Surgeon: Olean Ree, MD;  Location: [facility];  Service: General;  Laterality: N/A;    Family History Father history of colon cancer at age 44 No h/o GI disease or malignancy  Review of Systems  Constitutional:  Negative for activity change, appetite change, chills, diaphoresis, fatigue, fever and unexpected weight change.  HENT:  Negative for trouble swallowing and voice change.   Respiratory:  Negative for shortness of breath and wheezing.   Cardiovascular:  Negative for chest pain, palpitations and leg swelling.  Gastrointestinal:  Negative for abdominal distention, abdominal pain, anal bleeding, blood in stool, constipation, diarrhea,  nausea, rectal pain and vomiting.  Musculoskeletal:  Negative for arthralgias and myalgias.  Skin:  Negative for color change and pallor.  Neurological:  Negative for dizziness, syncope and weakness.  Psychiatric/Behavioral:  Negative for confusion.   All other systems reviewed and are negative.   Medications No current facility-administered medications on file prior to encounter.   Current Outpatient Medications on File Prior to Encounter  Medication Sig Dispense Refill   b complex vitamins tablet Take 1 tablet by mouth daily.     cholecalciferol (VITAMIN D3) 25 MCG (1000 UNIT) tablet Take 1,000 Units by mouth daily.     fluconazole (DIFLUCAN) 150 MG tablet Take 1 tablet (150 mg total) by mouth daily. Take one tablet today.  May repeat in 3 days. 1 tablet 0   ibuprofen (ADVIL) 600 MG tablet Take 1 tablet (600 mg total) by mouth every 8 (eight) hours as needed for moderate pain. 60 tablet 1   loratadine (CLARITIN) 10 MG tablet Take 10 mg by mouth daily as needed for allergies.     magnesium oxide (MAG-OX) 400 MG tablet Take 400 mg by mouth daily.     mupirocin ointment (BACTROBAN) 2 % Apply 1 application topically 2 (two) times daily. 30 g 0   pantoprazole (PROTONIX) 40 MG tablet Take 1 tablet (40 mg total) by mouth daily. 30 minutes before 90 tablet 3   Semaglutide,0.25 or 0.5MG /DOS, (OZEMPIC, 0.25 OR 0.5 MG/DOSE,) 2 MG/1.5ML SOPN Inject 0.25 mg into the skin once  a week. 1.5 mL 2   amoxicillin-clavulanate (AUGMENTIN) 875-125 MG tablet Take 1 tablet by mouth every 12 (twelve) hours. (Patient not taking: Reported on 07/16/2021) 14 tablet 0   COVID-19 At Home Antigen Test (CARESTART COVID-19 HOME TEST) KIT use as directed (Patient not taking: Reported on 05/14/2021) 2 kit 0    Pertinent medications related to GI and procedure were reviewed by me with the patient prior to the procedure   Current Facility-Administered Medications:    0.9 %  sodium chloride infusion, , Intravenous,  Continuous, Annamaria Helling, DO, Last Rate: 20 mL/hr at 07/19/21 0852, 20 mL/hr at 07/19/21 0076      Allergies  Allergen Reactions   Aspirin     bleeding   Sulfa Antibiotics Hives   Allergies were reviewed by me prior to the procedure  Objective    Vitals:   07/19/21 0842  BP: 114/80  Pulse: 81  Resp: 20  Temp: (!) 97.3 F (36.3 C)  TempSrc: Temporal  SpO2: 100%  Weight: 103 kg  Height: $Remove'5\' 2"'WpnclGH$  (1.575 m)     Physical Exam Vitals and nursing note reviewed.  Constitutional:      General: She is not in acute distress.    Appearance: Normal appearance. She is obese. She is not ill-appearing, toxic-appearing or diaphoretic.  HENT:     Head: Normocephalic and atraumatic.     Nose: Nose normal.     Mouth/Throat:     Mouth: Mucous membranes are moist.     Pharynx: Oropharynx is clear.  Eyes:     General: No scleral icterus.    Extraocular Movements: Extraocular movements intact.  Cardiovascular:     Rate and Rhythm: Normal rate and regular rhythm.     Heart sounds: Normal heart sounds. No murmur heard.   No friction rub. No gallop.  Pulmonary:     Effort: Pulmonary effort is normal. No respiratory distress.     Breath sounds: Normal breath sounds. No wheezing, rhonchi or rales.  Abdominal:     General: Bowel sounds are normal. There is no distension.     Palpations: Abdomen is soft.     Tenderness: There is no abdominal tenderness. There is no guarding or rebound.  Musculoskeletal:     Cervical back: Neck supple.     Right lower leg: No edema.     Left lower leg: No edema.  Skin:    General: Skin is warm and dry.     Coloration: Skin is not jaundiced or pale.  Neurological:     General: No focal deficit present.     Mental Status: She is alert and oriented to person, place, and time. Mental status is at baseline.  Psychiatric:        Mood and Affect: Mood normal.        Behavior: Behavior normal.        Thought Content: Thought content normal.         Judgment: Judgment normal.     Assessment:  Ms. Sarah [name] is a 51 y.o. female  who presents today for Colonoscopy for Surveillance-personal history colon polyps; family history of colon cancer (father at age 37).  Plan:  Colonoscopy with possible intervention today  Colonoscopy with possible biopsy, control of bleeding, polypectomy, and interventions as necessary has been discussed with the patient/patient representative. Informed consent was obtained from the patient/patient representative after explaining the indication, nature, and risks of the procedure including but not limited to death, bleeding, perforation,  missed neoplasm/lesions, cardiorespiratory compromise, and reaction to medications. Opportunity for questions was given and appropriate answers were provided. Patient/patient representative has verbalized understanding is amenable to undergoing the procedure.   Annamaria Helling, DO  [facility] Gastroenterology  Portions of the record may have been created with voice recognition software. Occasional wrong-word or 'sound-a-like' substitutions may have occurred due to the inherent limitations of voice recognition software.  Read the chart carefully and recognize, using context, where substitutions may have occurred.

## 2021-07-19 ENCOUNTER — Ambulatory Visit
Admission: RE | Admit: 2021-07-19 | Discharge: 2021-07-19 | Disposition: A | Discharge status: Home / Self Care | Payer: No Typology Code available for payment source | Attending: Gastroenterology | Admitting: Gastroenterology

## 2021-07-19 ENCOUNTER — Encounter
Admission: RE | Disposition: A | Discharge status: Home / Self Care | Payer: Self-pay | Source: Home / Self Care | Attending: Gastroenterology

## 2021-07-19 ENCOUNTER — Ambulatory Visit: Payer: No Typology Code available for payment source | Admitting: Certified Registered"

## 2021-07-19 ENCOUNTER — Encounter: Payer: Self-pay | Admitting: Gastroenterology

## 2021-07-19 DIAGNOSIS — G4730 Sleep apnea, unspecified: Secondary | ICD-10-CM | POA: Insufficient documentation

## 2021-07-19 DIAGNOSIS — Z1211 Encounter for screening for malignant neoplasm of colon: Secondary | ICD-10-CM | POA: Diagnosis present

## 2021-07-19 DIAGNOSIS — K5730 Diverticulosis of large intestine without perforation or abscess without bleeding: Secondary | ICD-10-CM | POA: Insufficient documentation

## 2021-07-19 DIAGNOSIS — K219 Gastro-esophageal reflux disease without esophagitis: Secondary | ICD-10-CM | POA: Diagnosis not present

## 2021-07-19 DIAGNOSIS — G8929 Other chronic pain: Secondary | ICD-10-CM | POA: Insufficient documentation

## 2021-07-19 DIAGNOSIS — K640 First degree hemorrhoids: Secondary | ICD-10-CM | POA: Insufficient documentation

## 2021-07-19 DIAGNOSIS — Z800 Family history of malignant neoplasm of digestive organs: Secondary | ICD-10-CM | POA: Diagnosis not present

## 2021-07-19 DIAGNOSIS — K635 Polyp of colon: Secondary | ICD-10-CM | POA: Diagnosis not present

## 2021-07-19 HISTORY — PX: COLONOSCOPY: SHX5424

## 2021-07-19 LAB — HM COLONOSCOPY

## 2021-07-19 SURGERY — COLONOSCOPY
Anesthesia: General

## 2021-07-19 MED ORDER — PROPOFOL 500 MG/50ML IV EMUL
INTRAVENOUS | Status: DC | PRN
Start: 2021-07-19 — End: 2021-07-19
  Administered 2021-07-19: 150 ug/kg/min via INTRAVENOUS

## 2021-07-19 MED ORDER — PROPOFOL 10 MG/ML IV BOLUS
INTRAVENOUS | Status: AC
  Filled 2021-07-19: qty 20

## 2021-07-19 MED ORDER — LIDOCAINE HCL (CARDIAC) PF 100 MG/5ML IV SOSY
PREFILLED_SYRINGE | INTRAVENOUS | Status: DC | PRN
  Administered 2021-07-19: 50 mg via INTRAVENOUS

## 2021-07-19 MED ORDER — PROPOFOL 10 MG/ML IV BOLUS
INTRAVENOUS | Status: DC | PRN
Start: 2021-07-19 — End: 2021-07-19
  Administered 2021-07-19: 60 mg via INTRAVENOUS
  Administered 2021-07-19: 20 mg via INTRAVENOUS

## 2021-07-19 MED ORDER — SODIUM CHLORIDE 0.9 % IV SOLN
INTRAVENOUS | Status: DC
  Administered 2021-07-19: 20 mL/h via INTRAVENOUS

## 2021-07-19 MED ORDER — PROPOFOL 500 MG/50ML IV EMUL
INTRAVENOUS | Status: AC
  Filled 2021-07-19: qty 50

## 2021-07-19 NOTE — Anesthesia Preprocedure Evaluation (Addendum)
Anesthesia Evaluation  Patient identified by MRN, date of birth, ID band Patient awake    Reviewed: Allergy & Precautions, NPO status , Patient's Chart, lab work & pertinent test results  History of Anesthesia Complications (+) PONV and history of anesthetic complications  Airway Mallampati: IV   Neck ROM: Full    Dental  (+)    Pulmonary sleep apnea and Continuous Positive Airway Pressure Ventilation , former smoker (quit 2012),    Pulmonary exam normal breath sounds clear to auscultation       Cardiovascular Exercise Tolerance: Good negative cardio ROS Normal cardiovascular exam Rhythm:Regular Rate:Normal     Neuro/Psych PSYCHIATRIC DISORDERS Anxiety Chronic pain    GI/Hepatic GERD  ,  Endo/Other  Class 3 obesity  Renal/GU Renal disease (nephrolithiasis)     Musculoskeletal   Abdominal   Peds  Hematology negative hematology ROS (+)   Anesthesia Other Findings   Reproductive/Obstetrics                            Anesthesia Physical Anesthesia Plan  ASA: 3  Anesthesia Plan: General   Post-op Pain Management:    Induction: Intravenous  PONV Risk Score and Plan: 4 or greater and Propofol infusion, TIVA and Treatment may vary due to age or medical condition  Airway Management Planned: Natural Airway  Additional Equipment:   Intra-op Plan:   Post-operative Plan:   Informed Consent: I have reviewed the patients History and Physical, chart, labs and discussed the procedure including the risks, benefits and alternatives for the proposed anesthesia with the patient or authorized representative who has indicated his/her understanding and acceptance.       Plan Discussed with: CRNA  Anesthesia Plan Comments: (LMA/GETA backup discussed.  Patient consented for risks of anesthesia including but not limited to:  - adverse reactions to medications - damage to eyes, teeth, lips or  other oral mucosa - nerve damage due to positioning  - sore throat or hoarseness - damage to heart, brain, nerves, lungs, other parts of body or loss of life  Informed patient about role of CRNA in peri- and intra-operative care.  Patient voiced understanding.)        Anesthesia Quick Evaluation

## 2021-07-19 NOTE — Op Note (Signed)
[facility] Gastroenterology Patient Name: Sarah [name] Procedure Date: 07/19/2021 9:34 AM MRN: [id number] Account #: [id number] Date of Birth: 1970-10-01 Admit Type: Outpatient Age: 51 Room: [facility] ENDO ROOM 2 Gender: Female Note Status: Finalized Instrument Name: Colonscope [id number] Procedure:             Colonoscopy Indications:           High risk colon cancer surveillance: Personal history                         of colonic polyps Providers:             Rueben Bash, DO Referring MD:          Nino Glow Mclean-Scocuzza MD, MD (Referring MD) Medicines:             Monitored Anesthesia Care Complications:         No immediate complications. Estimated blood loss:                         Minimal. Procedure:             Pre-Anesthesia Assessment:                        - Prior to the procedure, a History and Physical was                         performed, and patient medications and allergies were                         reviewed. The patient is competent. The risks and                         benefits of the procedure and the sedation options and                         risks were discussed with the patient. All questions                         were answered and informed consent was obtained.                         Patient identification and proposed procedure were                         verified by the physician, the nurse, the anesthetist                         and the technician in the endoscopy suite. Mental                         Status Examination: alert and oriented. Airway                         Examination: normal oropharyngeal airway and neck                         mobility. Respiratory Examination: clear to  auscultation. CV Examination: RRR, no murmurs, no S3                         or S4. Prophylactic Antibiotics: The patient does not                         require prophylactic antibiotics. Prior                          Anticoagulants: The patient has taken no previous                         anticoagulant or antiplatelet agents. ASA Grade                         Assessment: III - A patient with severe systemic                         disease. After reviewing the risks and benefits, the                         patient was deemed in satisfactory condition to                         undergo the procedure. The anesthesia plan was to use                         monitored anesthesia care (MAC). Immediately prior to                         administration of medications, the patient was                         re-assessed for adequacy to receive sedatives. The                         heart rate, respiratory rate, oxygen saturations,                         blood pressure, adequacy of pulmonary ventilation, and                         response to care were monitored throughout the                         procedure. The physical status of the patient was                         re-assessed after the procedure.                        After obtaining informed consent, the colonoscope was                         passed under direct vision. Throughout the procedure,                         the patient's blood pressure, pulse, and oxygen  saturations were monitored continuously. The                         Colonoscope was introduced through the anus and                         advanced to the the cecum, identified by appendiceal                         orifice and ileocecal valve. The colonoscopy was                         performed without difficulty. The patient tolerated                         the procedure well. The quality of the bowel                         preparation was evaluated using the BBPS [facility] Bowel                         Preparation Scale) with scores of: Right Colon = 3                         (entire mucosa seen well with no residual staining,                          small fragments of stool or opaque liquid), Transverse                         Colon = 3 (entire mucosa seen well with no residual                         staining, small fragments of stool or opaque liquid)                         and Left Colon = 2 (minor amount of residual staining,                         small fragments of stool and/or opaque liquid, but                         mucosa seen well). The total BBPS score equals 8. The                         quality of the bowel preparation was excellent. The                         ileocecal valve, appendiceal orifice, and rectum were                         photographed. Findings:      The perianal and digital rectal examinations were normal. Pertinent       negatives include normal sphincter tone.      Multiple small-mouthed diverticula were found in the entire colon.       Estimated blood loss: none.  Non-bleeding internal hemorrhoids were found during retroflexion. The       hemorrhoids were Grade I (internal hemorrhoids that do not prolapse).       Estimated blood loss: none.      Two sessile polyps were found in the sigmoid colon. The polyps were 1 to       2 mm in size. These polyps were removed with a cold biopsy forceps.       Resection and retrieval were complete. Estimated blood loss was minimal.      The exam was otherwise without abnormality on direct and retroflexion       views. Impression:            - Diverticulosis in the entire examined colon.                        - Non-bleeding internal hemorrhoids.                        - Two 1 to 2 mm polyps in the sigmoid colon, removed                         with a cold biopsy forceps. Resected and retrieved.                        - The examination was otherwise normal on direct and                         retroflexion views. Recommendation:        - Discharge patient to home.                        - Resume previous diet.                        - Continue present  medications.                        - Await pathology results.                        - Repeat colonoscopy for surveillance based on                         pathology results.                        - Return to referring physician as previously                         scheduled. Procedure Code(s):     --- Professional ---                        [id number], Colonoscopy, flexible; with biopsy, single or                         multiple Diagnosis Code(s):     --- Professional ---                        Z86.010, Personal history of colonic polyps  K64.0, First degree hemorrhoids                        K63.5, Polyp of colon                        K57.30, Diverticulosis of large intestine without                         perforation or abscess without bleeding CPT copyright 2019 American Medical Association. All rights reserved. The codes documented in this report are preliminary and upon coder review may  be revised to meet current compliance requirements. Attending Participation:      I personally performed the entire procedure. Volney American, DO Annamaria Helling DO, DO 07/19/2021 10:28:49 AM This report has been signed electronically. Number of Addenda: 0 Note Initiated On: 07/19/2021 9:34 AM Scope Withdrawal Time: 0 hours 20 minutes 36 seconds  Total Procedure Duration: 0 hours 32 minutes 0 seconds  Estimated Blood Loss:  Estimated blood loss was minimal.      [facility]

## 2021-07-19 NOTE — Transfer of Care (Signed)
Immediate Anesthesia Transfer of Care Note  Patient: Sarah [name]  Procedure(s) Performed: COLONOSCOPY  Patient Location: PACU and Endoscopy Unit  Anesthesia Type:General  Level of Consciousness: drowsy and patient cooperative  Airway & Oxygen Therapy: Patient Spontanous Breathing  Post-op Assessment: Report given to RN and Post -op Vital signs reviewed and stable  Post vital signs: Reviewed and stable  Last Vitals:  Vitals Value Taken Time  BP 103/66 07/19/21 1028  Temp    Pulse 80 07/19/21 1029  Resp 17 07/19/21 1029  SpO2 100 % 07/19/21 1029  Vitals shown include unvalidated device data.  Last Pain:  Vitals:   07/19/21 0842  TempSrc: Temporal  PainSc: 0-No pain         Complications: No notable events documented.

## 2021-07-19 NOTE — Anesthesia Procedure Notes (Signed)
Procedure Name: MAC Date/Time: 07/19/2021 9:47 AM Performed by: Jerrye Noble, CRNA Pre-anesthesia Checklist: Patient identified, Emergency Drugs available, Suction available and Patient being monitored Patient Re-evaluated:Patient Re-evaluated prior to induction Oxygen Delivery Method: Nasal cannula

## 2021-07-19 NOTE — Anesthesia Postprocedure Evaluation (Signed)
Anesthesia Post Note  Patient: Sarah [name]  Procedure(s) Performed: COLONOSCOPY  Patient location during evaluation: PACU Anesthesia Type: General Level of consciousness: awake and alert, oriented and patient cooperative Pain management: pain level controlled Vital Signs Assessment: post-procedure vital signs reviewed and stable Respiratory status: spontaneous breathing, nonlabored ventilation and respiratory function stable Cardiovascular status: blood pressure returned to baseline and stable Postop Assessment: adequate PO intake Anesthetic complications: no   No notable events documented.   Last Vitals:  Vitals:   07/19/21 1048 07/19/21 1058  BP: 106/83 109/78  Pulse: 79 80  Resp: (!) 22 15  Temp:    SpO2: 100% 98%    Last Pain:  Vitals:   07/19/21 1058  TempSrc:   PainSc: 0-No pain                 Darrin Nipper

## 2021-07-19 NOTE — Interval H&P Note (Signed)
History and Physical Interval Note: Preprocedure H&P from 07/19/21  was reviewed and there was no interval change after seeing and examining the patient.  Written consent was obtained from the patient after discussion of risks, benefits, and alternatives. Patient has consented to proceed with Colonoscopy with possible intervention   07/19/2021 9:44 AM  Sarah [name]  has presented today for surgery, with the diagnosis of History of adenomatous polyp of colon (Z86.010).  The various methods of treatment have been discussed with the patient and family. After consideration of risks, benefits and other options for treatment, the patient has consented to  Procedure(s): COLONOSCOPY (N/A) as a surgical intervention.  The patient's history has been reviewed, patient examined, no change in status, stable for surgery.  I have reviewed the patient's chart and labs.  Questions were answered to the patient's satisfaction.     Annamaria Helling

## 2021-07-20 ENCOUNTER — Encounter: Payer: Self-pay | Admitting: Gastroenterology

## 2021-07-20 LAB — SURGICAL PATHOLOGY

## 2021-07-22 ENCOUNTER — Encounter: Payer: Self-pay | Admitting: Internal Medicine

## 2021-07-28 ENCOUNTER — Other Ambulatory Visit: Payer: Self-pay | Admitting: Internal Medicine

## 2021-07-28 ENCOUNTER — Other Ambulatory Visit: Payer: Self-pay

## 2021-07-28 DIAGNOSIS — Z6841 Body Mass Index (BMI) 40.0 and over, adult: Secondary | ICD-10-CM

## 2021-07-28 DIAGNOSIS — R110 Nausea: Secondary | ICD-10-CM

## 2021-07-28 MED ORDER — OZEMPIC (0.25 OR 0.5 MG/DOSE) 2 MG/1.5ML ~~LOC~~ SOPN
0.5000 mg | PEN_INJECTOR | SUBCUTANEOUS | 2 refills | Status: DC
  Filled 2021-07-28: qty 1.5, 28d supply, fill #0

## 2021-07-28 MED ORDER — ONDANSETRON HCL 4 MG PO TABS
4.0000 mg | ORAL_TABLET | Freq: Three times a day (TID) | ORAL | 2 refills | Status: DC | PRN
  Filled 2021-07-28 – 2021-08-02 (×2): qty 40, 14d supply, fill #0

## 2021-08-02 ENCOUNTER — Other Ambulatory Visit: Payer: Self-pay

## 2021-08-03 ENCOUNTER — Encounter: Payer: Self-pay | Admitting: Internal Medicine

## 2021-08-09 ENCOUNTER — Other Ambulatory Visit: Payer: Self-pay

## 2021-08-09 DIAGNOSIS — J019 Acute sinusitis, unspecified: Secondary | ICD-10-CM | POA: Insufficient documentation

## 2021-08-09 DIAGNOSIS — Z87440 Personal history of urinary (tract) infections: Secondary | ICD-10-CM | POA: Insufficient documentation

## 2021-08-09 DIAGNOSIS — T7840XA Allergy, unspecified, initial encounter: Secondary | ICD-10-CM | POA: Insufficient documentation

## 2021-08-09 HISTORY — DX: Personal history of urinary (tract) infections: Z87.440

## 2021-08-09 HISTORY — DX: Acute sinusitis, unspecified: J01.90

## 2021-08-09 MED ORDER — AMOXICILLIN-POT CLAVULANATE 875-125 MG PO TABS
ORAL_TABLET | ORAL | 0 refills | Status: DC
  Filled 2021-08-09: qty 14, 7d supply, fill #0

## 2021-09-19 ENCOUNTER — Encounter: Payer: Self-pay | Admitting: Internal Medicine

## 2021-09-21 NOTE — Telephone Encounter (Signed)
Patient last seen virtually 05/14/21 for Obesity.  ? ?Does Patient need a new appointment to change prescription or okay to send in?  ?

## 2021-09-22 ENCOUNTER — Other Ambulatory Visit: Payer: Self-pay

## 2021-09-22 MED ORDER — WEGOVY 0.5 MG/0.5ML ~~LOC~~ SOAJ
0.5000 mg | SUBCUTANEOUS | 0 refills | Status: DC
  Filled 2021-09-22 – 2021-09-30 (×2): qty 2, 28d supply, fill #0

## 2021-09-22 MED ORDER — WEGOVY 1.7 MG/0.75ML ~~LOC~~ SOAJ
1.7000 mg | SUBCUTANEOUS | 0 refills | Status: DC
  Filled 2021-09-22: qty 3, fill #0
  Filled 2021-12-17: qty 3, 28d supply, fill #0

## 2021-09-22 MED ORDER — WEGOVY 2.4 MG/0.75ML ~~LOC~~ SOAJ
2.4000 mg | SUBCUTANEOUS | 0 refills | Status: DC
  Filled 2021-09-22: qty 3, fill #0

## 2021-09-22 MED ORDER — WEGOVY 0.25 MG/0.5ML ~~LOC~~ SOAJ
0.2500 mg | SUBCUTANEOUS | 0 refills | Status: DC
  Filled 2021-09-22 – 2021-09-28 (×3): qty 2, 28d supply, fill #0

## 2021-09-22 MED ORDER — WEGOVY 1 MG/0.5ML ~~LOC~~ SOAJ
1.0000 mg | SUBCUTANEOUS | 0 refills | Status: DC
  Filled 2021-09-22: qty 2, fill #0
  Filled 2021-11-15: qty 2, 28d supply, fill #0

## 2021-09-27 ENCOUNTER — Telehealth: Payer: Self-pay | Admitting: Internal Medicine

## 2021-09-27 NOTE — Telephone Encounter (Signed)
Prior authorization has been submitted for patient's [facility] ? ?Awaiting approval or denial.  ? ?

## 2021-09-28 ENCOUNTER — Other Ambulatory Visit: Payer: Self-pay

## 2021-09-29 ENCOUNTER — Other Ambulatory Visit: Payer: Self-pay

## 2021-09-30 ENCOUNTER — Other Ambulatory Visit: Payer: Self-pay

## 2021-10-05 ENCOUNTER — Other Ambulatory Visit: Payer: Self-pay

## 2021-11-15 ENCOUNTER — Other Ambulatory Visit: Payer: Self-pay

## 2021-11-17 ENCOUNTER — Telehealth (INDEPENDENT_AMBULATORY_CARE_PROVIDER_SITE_OTHER): Payer: No Typology Code available for payment source | Admitting: Internal Medicine

## 2021-11-17 ENCOUNTER — Encounter: Payer: Self-pay | Admitting: Internal Medicine

## 2021-11-17 ENCOUNTER — Other Ambulatory Visit: Payer: Self-pay

## 2021-11-17 VITALS — Ht 62.0 in | Wt 239.0 lb

## 2021-11-17 DIAGNOSIS — B379 Candidiasis, unspecified: Secondary | ICD-10-CM | POA: Diagnosis not present

## 2021-11-17 DIAGNOSIS — M549 Dorsalgia, unspecified: Secondary | ICD-10-CM

## 2021-11-17 DIAGNOSIS — M533 Sacrococcygeal disorders, not elsewhere classified: Secondary | ICD-10-CM | POA: Diagnosis not present

## 2021-11-17 DIAGNOSIS — Z6841 Body Mass Index (BMI) 40.0 and over, adult: Secondary | ICD-10-CM

## 2021-11-17 DIAGNOSIS — G8929 Other chronic pain: Secondary | ICD-10-CM

## 2021-11-17 HISTORY — DX: Body Mass Index (BMI) 40.0 and over, adult: Z684

## 2021-11-17 MED ORDER — FLUCONAZOLE 150 MG PO TABS
150.0000 mg | ORAL_TABLET | Freq: Every day | ORAL | 0 refills | Status: DC
  Filled 2021-11-17: qty 2, 2d supply, fill #0

## 2021-11-17 NOTE — Patient Instructions (Signed)

## 2021-11-17 NOTE — Progress Notes (Addendum)
Telephone Note  I connected with Ynez Eugenio  on 11/17/21 at 11:32 AM EDT by telephone and verified that I am speaking with the correct person using two identifiers.  Location patient: [place] Location provider:work or home office Persons participating in the virtual visit: patient, provider  I discussed the limitations and requested verbal permission for telemedicine visit. The patient expressed understanding and agreed to proceed.   HPI:  Acute telemedicine visit for : Vaginal itching tried diflucan x 1 last week but did not help no new products in vaginal area  Obesity lost 10 lbs on wegovy 0.5 now and been on tx x 7 weeks will increase to 1 mg next week  3. Chronic back pain  As of 11/2021 per pt  Can you please put in a referral to Dr. Dossie Arbour at [facility] pain clinic? I need a toredol/norflex shot right now due to pain in back, and sacroiliac joints from moving furniture and according to my insurance I can't get it unless you put in a referral. I really need it asap if at all possible.  Thank you so much!  -Pertinent past medical history: see below -Pertinent medication allergies: Allergies  Allergen Reactions   Aspirin     bleeding   Sulfa Antibiotics Hives   -COVID-19 vaccine status:  Immunization History  Administered Date(s) Administered   Influenza-Unspecified 04/08/2015, 04/03/2018, 04/09/2019, 03/27/2020, 04/08/2021   Tdap 12/07/2011     ROS: See pertinent positives and negatives per HPI.  Past Medical History:  Diagnosis Date   Allergy    Chicken pox    Chronic pain    GERD (gastroesophageal reflux disease)    History of kidney stones    Hyperlipidemia    PONV (postoperative nausea and vomiting)    Shingles    age 51   Sleep apnea    uses cpap machine    Surgical site infection 2022    Past Surgical History:  Procedure Laterality Date   ABDOMINAL HYSTERECTOMY  01/2001   DUB h/o abnormal pap ovaries intact age early 27s abnormal pap   CESAREAN SECTION   12/1987   CESAREAN SECTION  05/1993   CHOLECYSTECTOMY     COLONOSCOPY N/A 07/19/2021   Procedure: COLONOSCOPY;  Surgeon: Annamaria Helling, DO;  Location: [place];  Service: Gastroenterology;  Laterality: N/A;   COLONOSCOPY WITH PROPOFOL N/A 01/29/2016   Procedure: COLONOSCOPY WITH PROPOFOL;  Surgeon: Manya Silvas, MD;  Location: [facility] ENDOSCOPY;  Service: Endoscopy;  Laterality: N/A;   HERNIA REPAIR  2022   LITHOTRIPSY  7106   UMBILICAL HERNIA REPAIR N/A 11/12/2020   Procedure: HERNIA REPAIR UMBILICAL ADULT, open;  Surgeon: Olean Ree, MD;  Location: [facility] ORS;  Service: General;  Laterality: N/A;     Current Outpatient Medications:    b complex vitamins tablet, Take 1 tablet by mouth daily., Disp: , Rfl:    cholecalciferol (VITAMIN D3) 25 MCG (1000 UNIT) tablet, Take 1,000 Units by mouth daily., Disp: , Rfl:    fluconazole (DIFLUCAN) 150 MG tablet, Take 1 tablet (150 mg total) by mouth daily. Take one tablet today.  May repeat in 3 days., Disp: 2 tablet, Rfl: 0   ibuprofen (ADVIL) 600 MG tablet, Take 1 tablet (600 mg total) by mouth every 8 (eight) hours as needed for moderate pain., Disp: 60 tablet, Rfl: 1   loratadine (CLARITIN) 10 MG tablet, Take 10 mg by mouth daily as needed for allergies., Disp: , Rfl:    magnesium oxide (MAG-OX) 400 MG tablet,  Take 400 mg by mouth daily., Disp: , Rfl:    mupirocin ointment (BACTROBAN) 2 %, Apply 1 application topically 2 (two) times daily., Disp: 30 g, Rfl: 0   Na Sulfate-K Sulfate-Mg Sulf 17.5-3.13-1.6 GM/177ML SOLN, Take 1 Bottle by mouth as directed One kit contains 2 bottles.  Take both bottles at the times instructed by your provider., Disp: 354 mL, Rfl: 0   ondansetron (ZOFRAN) 4 MG tablet, Take 1 tablet (4 mg total) by mouth every 8 (eight) hours as needed., Disp: 40 tablet, Rfl: 2   pantoprazole (PROTONIX) 40 MG tablet, Take 1 tablet (40 mg total) by mouth daily. 30 minutes before, Disp: 90 tablet, Rfl: 3   Semaglutide-Weight  Management (WEGOVY) 0.5 MG/0.5ML SOAJ, Inject 0.5 mg into the skin once a week., Disp: 2 mL, Rfl: 0   amoxicillin-clavulanate (AUGMENTIN) 875-125 MG tablet, Take 1 tablet by mouth every 12 (twelve) hours. (Patient not taking: Reported on 07/16/2021), Disp: 14 tablet, Rfl: 0   amoxicillin-clavulanate (AUGMENTIN) 875-125 MG tablet, 1 tablet every 12 hours for 7 days (Patient not taking: Reported on 11/17/2021), Disp: 14 tablet, Rfl: 0   COVID-19 At Home Antigen Test (CARESTART COVID-19 HOME TEST) KIT, use as directed (Patient not taking: Reported on 05/14/2021), Disp: 2 kit, Rfl: 0   Semaglutide,0.25 or 0.5MG /DOS, (OZEMPIC, 0.25 OR 0.5 MG/DOSE,) 2 MG/1.5ML SOPN, Inject 0.5 mg into the skin once a week. (Patient not taking: Reported on 11/17/2021), Disp: 1.5 mL, Rfl: 2   Semaglutide-Weight Management (WEGOVY) 0.25 MG/0.5ML SOAJ, Inject 0.25 mg into the skin once a week. (Patient not taking: Reported on 11/17/2021), Disp: 2 mL, Rfl: 0   Semaglutide-Weight Management (WEGOVY) 1 MG/0.5ML SOAJ, Inject 1 mg into the skin once a week. (Patient not taking: Reported on 11/17/2021), Disp: 2 mL, Rfl: 0   Semaglutide-Weight Management (WEGOVY) 1.7 MG/0.75ML SOAJ, Inject 1.7 mg into the skin once a week. (Patient not taking: Reported on 11/17/2021), Disp: 3 mL, Rfl: 0   Semaglutide-Weight Management (WEGOVY) 2.4 MG/0.75ML SOAJ, Inject 2.4 mg into the skin once a week. (Patient not taking: Reported on 11/17/2021), Disp: 3 mL, Rfl: 0  EXAM:  VITALS per patient if applicable:  GENERAL: alert, oriented, appears well and in no acute distress  PSYCH/NEURO: pleasant and cooperative, no obvious depression or anxiety, speech and thought processing grossly intact  ASSESSMENT AND PLAN:  Discussed the following assessment and plan:  Yeast infection - Plan: fluconazole (DIFLUCAN) 150 MG tablet, Urine cytology ancillary only([place]) if not better in 1-2 weeks  BMI 40.0-44.9, adult ([place]) On wegovy 0.5 will increase to 1 mg  weekly in the future  Chronic back pain  My chart message 11/2021  Can you please put in a referral to Dr. Dossie Arbour at [facility] pain clinic? I need a toredol/norflex shot right now due to pain in back, and sacroiliac joints from moving furniture and according to my insurance I can't get it unless you put in a referral. I really need it asap if at all possible.  Thank you so much!   HM Flu shot utd  Tdap had 12/07/11 Consider MMR vaccine Hep B immune  Hep C pending Consider shingrix in future  Declines covid 19 vaccine for now  Declines STD testing   Pap had 12/09/16 neg pap neg HPV Q5 years s/p hysterectomy h/o abnormal pap still has ovaries  Due pap today    mammo 04/2020 neg referred    Dermatology as of 01/15/21 never seen call back when ready for referral  Disc healthy diet choices, exercise and sunscreen  rec healthy diet and exercise   Colonoscopy saw 01/29/16 Dr. Tiffany Kocher tubular adenoma + and FH dad colon cancer due 01/28/21 , referred today 01/15/21    -we discussed possible serious and likely etiologies, options for evaluation and workup, limitations of telemedicine visit vs in person visit, treatment, treatment risks and precautions. Pt is agreeable to treatment via telemedicine at this moment.     I discussed the assessment and treatment plan with the patient. The patient was provided an opportunity to ask questions and all were answered. The patient agreed with the plan and demonstrated an understanding of the instructions.    Time spent 11 minutes  Delorise Jackson, MD

## 2021-11-18 NOTE — Addendum Note (Signed)
Encounter addended by: Annie Paras on: 11/18/2021 10:41 AM  Actions taken: Letter saved

## 2021-12-01 ENCOUNTER — Other Ambulatory Visit: Payer: Self-pay | Admitting: Pain Medicine

## 2021-12-01 ENCOUNTER — Other Ambulatory Visit: Payer: Self-pay

## 2021-12-01 ENCOUNTER — Encounter: Payer: Self-pay | Admitting: Internal Medicine

## 2021-12-01 DIAGNOSIS — M5441 Lumbago with sciatica, right side: Secondary | ICD-10-CM | POA: Insufficient documentation

## 2021-12-01 DIAGNOSIS — M5416 Radiculopathy, lumbar region: Secondary | ICD-10-CM | POA: Insufficient documentation

## 2021-12-01 HISTORY — DX: Radiculopathy, lumbar region: M54.16

## 2021-12-01 HISTORY — DX: Lumbago with sciatica, right side: M54.41

## 2021-12-01 MED ORDER — PREDNISONE 20 MG PO TABS
ORAL_TABLET | ORAL | 0 refills | Status: AC
  Filled 2021-12-01: qty 18, 9d supply, fill #0

## 2021-12-01 NOTE — Progress Notes (Signed)
(  12/01/2021) the patient was moving some furniture this past weekend and has developed radicular right lower extremity symptoms.  Steroid taper pack called in today.

## 2021-12-02 NOTE — Addendum Note (Signed)
Addended by: Orland Mustard on: 12/02/2021 12:48 PM   Modules accepted: Orders

## 2021-12-17 ENCOUNTER — Other Ambulatory Visit: Payer: Self-pay

## 2021-12-27 ENCOUNTER — Telehealth: Payer: Self-pay | Admitting: Internal Medicine

## 2021-12-27 NOTE — Telephone Encounter (Signed)
Lmtcb to office regarding her message

## 2021-12-27 NOTE — Telephone Encounter (Signed)
This message was in the My chart appointments. Patient having right side  upper abd pain.   Appointment For: Sarah [name] ([id number]) Visit Type: OFFICE VISIT (1004)   12/30/2021     2:00 PM  20 mins.  McLean-Scocuzza, Nino Glow, MD [facility]   Patient Comments: Right side Upper abdominal pain

## 2021-12-30 ENCOUNTER — Ambulatory Visit: Payer: No Typology Code available for payment source | Admitting: Internal Medicine

## 2021-12-30 NOTE — Telephone Encounter (Signed)
Patient returned office phone call. 

## 2021-12-30 NOTE — Telephone Encounter (Signed)
Lmtcb to office regarding her message

## 2022-01-02 ENCOUNTER — Telehealth: Payer: No Typology Code available for payment source | Admitting: Family Medicine

## 2022-01-02 ENCOUNTER — Other Ambulatory Visit: Payer: Self-pay

## 2022-01-02 DIAGNOSIS — J40 Bronchitis, not specified as acute or chronic: Secondary | ICD-10-CM | POA: Diagnosis not present

## 2022-01-02 MED ORDER — PROMETHAZINE-DM 6.25-15 MG/5ML PO SYRP
5.0000 mL | ORAL_SOLUTION | Freq: Four times a day (QID) | ORAL | 0 refills | Status: DC | PRN
  Filled 2022-01-02: qty 118, 6d supply, fill #0

## 2022-01-02 MED ORDER — AZITHROMYCIN 250 MG PO TABS
ORAL_TABLET | ORAL | 0 refills | Status: AC
  Filled 2022-01-02: qty 6, 5d supply, fill #0

## 2022-01-02 NOTE — Progress Notes (Signed)
Virtual Visit Consent   Renley Gutman Reynolds, you are scheduled for a virtual visit with a [facility] provider today. Just as with appointments in the office, your consent must be obtained to participate. Your consent will be active for this visit and any virtual visit you may have with one of our providers in the next 365 days. If you have a MyChart account, a copy of this consent can be sent to you electronically.  As this is a virtual visit, video technology does not allow for your provider to perform a traditional examination. This may limit your provider's ability to fully assess your condition. If your provider identifies any concerns that need to be evaluated in person or the need to arrange testing (such as labs, EKG, etc.), we will make arrangements to do so. Although advances in technology are sophisticated, we cannot ensure that it will always work on either your end or our end. If the connection with a video visit is poor, the visit may have to be switched to a telephone visit. With either a video or telephone visit, we are not always able to ensure that we have a secure connection.  By engaging in this virtual visit, you consent to the provision of healthcare and authorize for your insurance to be billed (if applicable) for the services provided during this visit. Depending on your insurance coverage, you may receive a charge related to this service.  I need to obtain your verbal consent now. Are you willing to proceed with your visit today? Lesette Frary Husain has provided verbal consent on 01/02/2022 for a virtual visit (video or telephone). Georgana Curio, FNP  Date: 01/02/2022 11:55 AM  Virtual Visit via Video Note   I, Georgana Curio, connected with  Shadana Pry Tabar  ([id number], 07-Aug-1970) on 01/02/22 at 11:15 AM EDT by a video-enabled telemedicine application and verified that I am speaking with the correct person using two identifiers.  Location: Patient: Virtual Visit Location Patient:  Home Provider: Virtual Visit Location Provider: Office/Clinic   I discussed the limitations of evaluation and management by telemedicine and the availability of in person appointments. The patient expressed understanding and agreed to proceed.    History of Present Illness: ISIDRA MINGS is a 51 y.o. who identifies as a female who was assigned female at birth, and is being seen today for Cough, congestion,fever, throat pain, mucinex no help, nyquil. Sx for 5 days. Worse. No wheezing or sob. Yellow phelgm,covid test at home neg.ASA and sulfa allergyu.   HPI: HPI  Problems:  Patient Active Problem List   Diagnosis Date Noted   Acute right lumbar radiculopathy 12/01/2021   Acute right-sided low back pain with right-sided sciatica 12/01/2021   BMI 40.0-44.9, adult (HCC) 11/17/2021   Allergy, unspecified, initial encounter 08/09/2021   Acute sinusitis, unspecified 08/09/2021   Personal history of urinary (tract) infections 08/09/2021   Symptomatic cholelithiasis 01/16/2020   Kidney stone on left side 01/16/2020   Ventral hernia 01/16/2020   Umbilical hernia 01/16/2020   Diverticulosis 01/16/2020   OSA on CPAP 12/15/2019   Trigger point with back pain (Rhomboid area) 11/18/2019   Acute upper back pain 11/18/2019   Rhomboid muscle strain, initial encounter 11/18/2019   Left lumbar radiculopathy 06/14/2019   DDD (degenerative disc disease), lumbar 06/14/2019   Spondylosis 06/14/2019   Insomnia 06/14/2019   Chronic pain syndrome 10/08/2018   Class 3 severe obesity due to excess calories without serious comorbidity with body mass index (BMI) of 40.0  to 44.9 in adult [facility]) 12/15/2017   Annual physical exam 12/09/2016   Cervical facet hypertrophy 12/07/2016   Osteoarthritis of cervical spine 12/07/2016   Cervical facet syndrome (HCC) (Left) 12/07/2016   Cervical radiculitis (Left) 12/07/2016   Myofascial pain syndrome, cervical 12/07/2016   Musculoskeletal pain 12/07/2016   Neurogenic  pain 12/07/2016   Chronic hip pain (Left) 03/15/2016   History of nephrolithiasis 01/26/2016   Chronic knee pain (Left) 11/17/2015   Morbid obesity ([place]) 09/20/2015   Gastro-esophageal reflux disease without esophagitis 09/20/2015    Allergies:  Allergies  Allergen Reactions   Aspirin     bleeding   Sulfa Antibiotics Hives   Medications:  Current Outpatient Medications:    azithromycin (ZITHROMAX) 250 MG tablet, Take 2 tablets on day 1, then 1 tablet daily on days 2 through 5, Disp: 6 tablet, Rfl: 0   promethazine-dextromethorphan (PROMETHAZINE-DM) 6.25-15 MG/5ML syrup, Take 5 mLs by mouth 4 (four) times daily as needed for cough., Disp: 118 mL, Rfl: 0   amoxicillin-clavulanate (AUGMENTIN) 875-125 MG tablet, Take 1 tablet by mouth every 12 (twelve) hours. (Patient not taking: Reported on 07/16/2021), Disp: 14 tablet, Rfl: 0   amoxicillin-clavulanate (AUGMENTIN) 875-125 MG tablet, 1 tablet every 12 hours for 7 days (Patient not taking: Reported on 11/17/2021), Disp: 14 tablet, Rfl: 0   b complex vitamins tablet, Take 1 tablet by mouth daily., Disp: , Rfl:    cholecalciferol (VITAMIN D3) 25 MCG (1000 UNIT) tablet, Take 1,000 Units by mouth daily., Disp: , Rfl:    COVID-19 At Home Antigen Test [facility] COVID-19 HOME TEST) KIT, use as directed (Patient not taking: Reported on 05/14/2021), Disp: 2 kit, Rfl: 0   fluconazole (DIFLUCAN) 150 MG tablet, Take 1 tablet (150 mg total) by mouth daily. Take one tablet today.  May repeat in 3 days., Disp: 2 tablet, Rfl: 0   ibuprofen (ADVIL) 600 MG tablet, Take 1 tablet (600 mg total) by mouth every 8 (eight) hours as needed for moderate pain., Disp: 60 tablet, Rfl: 1   loratadine (CLARITIN) 10 MG tablet, Take 10 mg by mouth daily as needed for allergies., Disp: , Rfl:    magnesium oxide (MAG-OX) 400 MG tablet, Take 400 mg by mouth daily., Disp: , Rfl:    mupirocin ointment (BACTROBAN) 2 %, Apply 1 application topically 2 (two) times daily., Disp: 30 g,  Rfl: 0   Na Sulfate-K Sulfate-Mg Sulf 17.5-3.13-1.6 GM/177ML SOLN, Take 1 Bottle by mouth as directed One kit contains 2 bottles.  Take both bottles at the times instructed by your provider., Disp: 354 mL, Rfl: 0   ondansetron (ZOFRAN) 4 MG tablet, Take 1 tablet (4 mg total) by mouth every 8 (eight) hours as needed., Disp: 40 tablet, Rfl: 2   pantoprazole (PROTONIX) 40 MG tablet, Take 1 tablet (40 mg total) by mouth daily. 30 minutes before, Disp: 90 tablet, Rfl: 3   Semaglutide,0.25 or 0.5MG /DOS, (OZEMPIC, 0.25 OR 0.5 MG/DOSE,) 2 MG/1.5ML SOPN, Inject 0.5 mg into the skin once a week. (Patient not taking: Reported on 11/17/2021), Disp: 1.5 mL, Rfl: 2   Semaglutide-Weight Management (WEGOVY) 0.25 MG/0.5ML SOAJ, Inject 0.25 mg into the skin once a week. (Patient not taking: Reported on 11/17/2021), Disp: 2 mL, Rfl: 0   Semaglutide-Weight Management (WEGOVY) 0.5 MG/0.5ML SOAJ, Inject 0.5 mg into the skin once a week., Disp: 2 mL, Rfl: 0   Semaglutide-Weight Management (WEGOVY) 1 MG/0.5ML SOAJ, Inject 1 mg into the skin once a week. (Patient not taking: Reported on  11/17/2021), Disp: 2 mL, Rfl: 0   Semaglutide-Weight Management (WEGOVY) 1.7 MG/0.75ML SOAJ, Inject 1.7 mg into the skin once a week. (Patient not taking: Reported on 11/17/2021), Disp: 3 mL, Rfl: 0   Semaglutide-Weight Management (WEGOVY) 2.4 MG/0.75ML SOAJ, Inject 2.4 mg into the skin once a week. (Patient not taking: Reported on 11/17/2021), Disp: 3 mL, Rfl: 0  Observations/Objective: Patient is well-developed, well-nourished in no acute distress.  Resting comfortably  at home.  Head is normocephalic, atraumatic.  No labored breathing.  Speech is clear and coherent with logical content.  Patient is alert and oriented at baseline.    Assessment and Plan: 1. Bronchitis  Increase fluids, humidifier at night, med use and side effects discussed, urgent care for persistent or worsening sx.   Follow Up Instructions: I discussed the  assessment and treatment plan with the patient. The patient was provided an opportunity to ask questions and all were answered. The patient agreed with the plan and demonstrated an understanding of the instructions.  A copy of instructions were sent to the patient via MyChart unless otherwise noted below.     The patient was advised to call back or seek an in-person evaluation if the symptoms worsen or if the condition fails to improve as anticipated.  Time:  I spent 10 minutes with the patient via telehealth technology discussing the above problems/concerns.    Dellia Nims, FNP

## 2022-01-02 NOTE — Patient Instructions (Signed)
Acute Bronchitis, Adult ? ?Acute bronchitis is sudden inflammation of the main airways (bronchi) that come off the windpipe (trachea) in the lungs. The swelling causes the airways to get smaller and make more mucus than normal. This can make it hard to breathe and can cause coughing or noisy breathing (wheezing). ?Acute bronchitis may last several weeks. The cough may last longer. Allergies, asthma, and exposure to smoke may make the condition worse. ?What are the causes? ?This condition can be caused by germs and by substances that irritate the lungs, including: ?Cold and flu viruses. The most common cause of this condition is the virus that causes the common cold. ?Bacteria. This is less common. ?Breathing in substances that irritate the lungs, including: ?Smoke from cigarettes and other forms of tobacco. ?Dust and pollen. ?Fumes from household cleaning products, gases, or burned fuel. ?Indoor or outdoor air pollution. ?What increases the risk? ?The following factors may make you more likely to develop this condition: ?A weak body's defense system, also called the immune system. ?A condition that affects your lungs and breathing, such as asthma. ?What are the signs or symptoms? ?Common symptoms of this condition include: ?Coughing. This may bring up clear, yellow, or green mucus from your lungs (sputum). ?Wheezing. ?Runny or stuffy nose. ?Having too much mucus in your lungs (chest congestion). ?Shortness of breath. ?Aches and pains, including sore throat or chest. ?How is this diagnosed? ?This condition is usually diagnosed based on: ?Your symptoms and medical history. ?A physical exam. ?You may also have other tests, including tests to rule out other conditions, such as pneumonia. These tests include: ?A test of lung function. ?Test of a mucus sample to look for the presence of bacteria. ?Tests to check the oxygen level in your blood. ?Blood tests. ?Chest X-ray. ?How is this treated? ?Most cases of acute  bronchitis clear up over time without treatment. Your health care provider may recommend: ?Drinking more fluids to help thin your mucus so it is easier to cough up. ?Taking inhaled medicine (inhaler) to improve air flow in and out of your lungs. ?Using a vaporizer or a humidifier. These are machines that add water to the air to help you breathe better. ?Taking a medicine that thins mucus and clears congestion (expectorant). ?Taking a medicine that prevents or stops coughing (cough suppressant). ?It is notcommon to take an antibiotic medicine for this condition. ?Follow these instructions at home: ? ?Take over-the-counter and prescription medicines only as told by your health care provider. ?Use an inhaler, vaporizer, or humidifier as told by your health care provider. ?Take two teaspoons (10 mL) of honey at bedtime to lessen coughing at night. ?Drink enough fluid to keep your urine pale yellow. ?Do not use any products that contain nicotine or tobacco. These products include cigarettes, chewing tobacco, and vaping devices, such as e-cigarettes. If you need help quitting, ask your health care provider. ?Get plenty of rest. ?Return to your normal activities as told by your health care provider. Ask your health care provider what activities are safe for you. ?Keep all follow-up visits. This is important. ?How is this prevented? ?To lower your risk of getting this condition again: ?Wash your hands often with soap and water for at least 20 seconds. If soap and water are not available, use hand sanitizer. ?Avoid contact with people who have cold symptoms. ?Try not to touch your mouth, nose, or eyes with your hands. ?Avoid breathing in smoke or chemical fumes. Breathing smoke or chemical fumes will make your   condition worse. ?Get the flu shot every year. ?Contact a health care provider if: ?Your symptoms do not improve after 2 weeks. ?You have trouble coughing up the mucus. ?Your cough keeps you awake at night. ?You have a  fever. ?Get help right away if you: ?Cough up blood. ?Feel pain in your chest. ?Have severe shortness of breath. ?Faint or keep feeling like you are going to faint. ?Have a severe headache. ?Have a fever or chills that get worse. ?These symptoms may represent a serious problem that is an emergency. Do not wait to see if the symptoms will go away. Get medical help right away. Call your local emergency services (911 in the [place] Do not drive yourself to the hospital. ?Summary ?Acute bronchitis is inflammation of the main airways (bronchi) that come off the windpipe (trachea) in the lungs. The swelling causes the airways to get smaller and make more mucus than normal. ?Drinking more fluids can help thin your mucus so it is easier to cough up. ?Take over-the-counter and prescription medicines only as told by your health care provider. ?Do not use any products that contain nicotine or tobacco. These products include cigarettes, chewing tobacco, and vaping devices, such as e-cigarettes. If you need help quitting, ask your health care provider. ?Contact a health care provider if your symptoms do not improve after 2 weeks. ?This information is not intended to replace advice given to you by your health care provider. Make sure you discuss any questions you have with your health care provider. ?Document Revised: 10/14/2020 Document Reviewed: 10/14/2020 ?Elsevier Patient Education ? 2023 [employer] ? ?

## 2022-01-03 ENCOUNTER — Other Ambulatory Visit: Payer: Self-pay

## 2022-01-03 ENCOUNTER — Other Ambulatory Visit: Payer: Self-pay | Admitting: Internal Medicine

## 2022-01-03 DIAGNOSIS — B379 Candidiasis, unspecified: Secondary | ICD-10-CM

## 2022-01-03 MED ORDER — FLUCONAZOLE 150 MG PO TABS
150.0000 mg | ORAL_TABLET | Freq: Every day | ORAL | 0 refills | Status: DC
  Filled 2022-01-03: qty 2, 3d supply, fill #0

## 2022-01-21 ENCOUNTER — Encounter: Payer: Self-pay | Admitting: Internal Medicine

## 2022-01-21 ENCOUNTER — Ambulatory Visit (INDEPENDENT_AMBULATORY_CARE_PROVIDER_SITE_OTHER): Payer: No Typology Code available for payment source

## 2022-01-21 ENCOUNTER — Ambulatory Visit (INDEPENDENT_AMBULATORY_CARE_PROVIDER_SITE_OTHER): Payer: No Typology Code available for payment source | Admitting: Internal Medicine

## 2022-01-21 ENCOUNTER — Other Ambulatory Visit: Payer: Self-pay

## 2022-01-21 VITALS — BP 140/70 | HR 78 | Temp 98.5°F | Ht 62.0 in | Wt 239.4 lb

## 2022-01-21 DIAGNOSIS — Z23 Encounter for immunization: Secondary | ICD-10-CM

## 2022-01-21 DIAGNOSIS — Z0001 Encounter for general adult medical examination with abnormal findings: Secondary | ICD-10-CM | POA: Diagnosis not present

## 2022-01-21 DIAGNOSIS — Z1329 Encounter for screening for other suspected endocrine disorder: Secondary | ICD-10-CM | POA: Diagnosis not present

## 2022-01-21 DIAGNOSIS — E559 Vitamin D deficiency, unspecified: Secondary | ICD-10-CM | POA: Diagnosis not present

## 2022-01-21 DIAGNOSIS — E538 Deficiency of other specified B group vitamins: Secondary | ICD-10-CM | POA: Diagnosis not present

## 2022-01-21 DIAGNOSIS — Z1389 Encounter for screening for other disorder: Secondary | ICD-10-CM

## 2022-01-21 DIAGNOSIS — Z0000 Encounter for general adult medical examination without abnormal findings: Secondary | ICD-10-CM

## 2022-01-21 DIAGNOSIS — Z1231 Encounter for screening mammogram for malignant neoplasm of breast: Secondary | ICD-10-CM

## 2022-01-21 DIAGNOSIS — E8881 Metabolic syndrome: Secondary | ICD-10-CM

## 2022-01-21 DIAGNOSIS — R051 Acute cough: Secondary | ICD-10-CM | POA: Diagnosis not present

## 2022-01-21 DIAGNOSIS — M25512 Pain in left shoulder: Secondary | ICD-10-CM

## 2022-01-21 DIAGNOSIS — R635 Abnormal weight gain: Secondary | ICD-10-CM | POA: Diagnosis not present

## 2022-01-21 DIAGNOSIS — R5383 Other fatigue: Secondary | ICD-10-CM | POA: Diagnosis not present

## 2022-01-21 LAB — CBC WITH DIFFERENTIAL/PLATELET
Basophils Absolute: 0 10*3/uL (ref 0.0–0.1)
Basophils Relative: 0.6 % (ref 0.0–3.0)
Eosinophils Absolute: 0.2 10*3/uL (ref 0.0–0.7)
Eosinophils Relative: 3.1 % (ref 0.0–5.0)
HCT: 39.8 % (ref 36.0–46.0)
Hemoglobin: 13.3 g/dL (ref 12.0–15.0)
Lymphocytes Relative: 42.2 % (ref 12.0–46.0)
Lymphs Abs: 3 10*3/uL (ref 0.7–4.0)
MCHC: 33.4 g/dL (ref 30.0–36.0)
MCV: 90.4 fl (ref 78.0–100.0)
Monocytes Absolute: 0.5 10*3/uL (ref 0.1–1.0)
Monocytes Relative: 6.6 % (ref 3.0–12.0)
Neutro Abs: 3.3 10*3/uL (ref 1.4–7.7)
Neutrophils Relative %: 47.5 % (ref 43.0–77.0)
Platelets: 258 10*3/uL (ref 150.0–400.0)
RBC: 4.4 Mil/uL (ref 3.87–5.11)
RDW: 15.1 % (ref 11.5–15.5)
WBC: 7 10*3/uL (ref 4.0–10.5)

## 2022-01-21 LAB — LIPID PANEL
Cholesterol: 218 mg/dL — ABNORMAL HIGH (ref 0–200)
HDL: 66.2 mg/dL (ref >39.00)
LDL Cholesterol: 133 mg/dL — ABNORMAL HIGH (ref 0–99)
NonHDL: 152.08
Total CHOL/HDL Ratio: 3
Triglycerides: 93 mg/dL (ref 0.0–149.0)
VLDL: 18.6 mg/dL (ref 0.0–40.0)

## 2022-01-21 LAB — COMPREHENSIVE METABOLIC PANEL
ALT: 18 U/L (ref 0–35)
AST: 16 U/L (ref 0–37)
Albumin: 4.5 g/dL (ref 3.5–5.2)
Alkaline Phosphatase: 74 U/L (ref 39–117)
BUN: 16 mg/dL (ref 6–23)
CO2: 28 mEq/L (ref 19–32)
Calcium: 9.3 mg/dL (ref 8.4–10.5)
Chloride: 106 mEq/L (ref 96–112)
Creatinine, Ser: 0.8 mg/dL (ref 0.40–1.20)
GFR: 84.35 mL/min (ref >60.00)
Glucose, Bld: 84 mg/dL (ref 70–99)
Potassium: 4.4 mEq/L (ref 3.5–5.1)
Sodium: 141 mEq/L (ref 135–145)
Total Bilirubin: 0.4 mg/dL (ref 0.2–1.2)
Total Protein: 6.6 g/dL (ref 6.0–8.3)

## 2022-01-21 LAB — TSH: TSH: 2.23 u[IU]/mL (ref 0.35–5.50)

## 2022-01-21 LAB — VITAMIN D 25 HYDROXY (VIT D DEFICIENCY, FRACTURES): VITD: 23.8 ng/mL — ABNORMAL LOW (ref 30.00–100.00)

## 2022-01-21 LAB — VITAMIN B12: Vitamin B-12: 464 pg/mL (ref 211–911)

## 2022-01-21 MED ORDER — HYDROCOD POLI-CHLORPHE POLI ER 10-8 MG/5ML PO SUER
5.0000 mL | Freq: Every evening | ORAL | 0 refills | Status: DC | PRN
  Filled 2022-01-21: qty 115, 23d supply, fill #0

## 2022-01-21 NOTE — Progress Notes (Signed)
Chief Complaint  Patient presents with   Annual Exam    CPE and pt is fasting   Annual 1. Early 12/2021 had cough and still has lingering cough tx promethazine/dm and zpack tried otc mucinex  2. Obesity unable to lose on wegovy 1.7 weekly but having constipation will stop  3. Left shoulder pain will f/u with pain clinic      Review of Systems  Constitutional:  Negative for weight loss.  HENT:  Negative for hearing loss.   Eyes:  Negative for blurred vision.  Respiratory:  Negative for shortness of breath.   Cardiovascular:  Negative for chest pain.  Gastrointestinal:  Negative for abdominal pain and blood in stool.  Genitourinary:  Negative for dysuria.  Musculoskeletal:  Negative for falls and joint pain.  Skin:  Negative for rash.  Neurological:  Negative for headaches.  Psychiatric/Behavioral:  Negative for depression.    Past Medical History:  Diagnosis Date   Allergy    Chicken pox    Chronic pain    GERD (gastroesophageal reflux disease)    History of kidney stones    Hyperlipidemia    PONV (postoperative nausea and vomiting)    Shingles    age 51   Sleep apnea    uses cpap machine    Surgical site infection 2022   Past Surgical History:  Procedure Laterality Date   ABDOMINAL HYSTERECTOMY  01/2001   DUB h/o abnormal pap ovaries intact age early 34s abnormal pap   CESAREAN SECTION  12/1987   CESAREAN SECTION  05/1993   CHOLECYSTECTOMY     COLONOSCOPY N/A 07/19/2021   Procedure: COLONOSCOPY;  Surgeon: Annamaria Helling, DO;  Location: [place];  Service: Gastroenterology;  Laterality: N/A;   COLONOSCOPY WITH PROPOFOL N/A 01/29/2016   Procedure: COLONOSCOPY WITH PROPOFOL;  Surgeon: Manya Silvas, MD;  Location: [facility] ENDOSCOPY;  Service: Endoscopy;  Laterality: N/A;   HERNIA REPAIR  2022   LITHOTRIPSY  [id number]   UMBILICAL HERNIA REPAIR N/A 11/12/2020   Procedure: HERNIA REPAIR UMBILICAL ADULT, open;  Surgeon: Olean Ree, MD;  Location: [facility] ORS;   Service: General;  Laterality: N/A;   Family History  Problem Relation Age of Onset   Breast cancer Mother 36   Cancer Mother        breast   Colon cancer Father 35   Lung cancer Father    Diabetes Father    Hypertension Paternal Grandfather    Breast cancer Maternal Grandmother        30's   Cancer Maternal Grandmother        breast    Cancer Cousin        breast    Social History   Socioeconomic History   Marital status: Widowed    Spouse name: Not on file   Number of children: Not on file   Years of education: Not on file   Highest education level: Not on file  Occupational History   Not on file  Tobacco Use   Smoking status: Former    Packs/day: 0.50    Years: 10.00    Total pack years: 5.00    Types: Cigarettes    Quit date: 06/27/2010    Years since quitting: 11.5   Smokeless tobacco: Never   Tobacco comments:    quit 2015  Vaping Use   Vaping Use: Former   Quit date: 02/27/2017   Devices: quit 2018  Substance and Sexual Activity   Alcohol use: Yes  Alcohol/week: 0.0 standard drinks of alcohol    Comment: occasional and very rarely    Drug use: No   Sexual activity: Yes    Partners: Male    Birth control/protection: Surgical    Comment: Husband  Other Topics Concern   Not on file  Social History Narrative   Married but husband of 106 years died 2019/10/03 of throat cancer which spread   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   chlorpheniramine-HYDROcodone  (TUSSIONEX PENNKINETIC ER) 10-8 MG/5ML Take 5 mLs by mouth at bedtime as needed.   cholecalciferol (VITAMIN D3) 25 MCG (1000 UNIT) tablet Take 1,000 Units by mouth daily.   fluconazole (DIFLUCAN) 150 MG tablet Take 1 tablet (150 mg total) by mouth daily. Take one tablet today.  May repeat in 3 days.   loratadine (CLARITIN) 10 MG tablet Take 10 mg by mouth daily as needed for allergies.   magnesium oxide (MAG-OX) 400 MG tablet Take 400 mg by mouth daily.   ondansetron (ZOFRAN) 4 MG tablet Take 1 tablet (4 mg total) by mouth every 8 (eight) hours as needed.   pantoprazole (PROTONIX) 40 MG tablet Take 1 tablet (40 mg total) by mouth daily. 30 minutes before   Semaglutide-Weight Management (WEGOVY) 1.7 MG/0.75ML SOAJ Inject 1.7 mg into the skin once a week.   Semaglutide-Weight Management (WEGOVY) 2.4 MG/0.75ML SOAJ Inject 2.4 mg into the skin once a week.   [DISCONTINUED] promethazine-dextromethorphan (PROMETHAZINE-DM) 6.25-15 MG/5ML syrup Take 5 mLs by mouth 4 (four) times daily as needed for cough.   [DISCONTINUED] Semaglutide-Weight Management (WEGOVY) 0.25 MG/0.5ML SOAJ Inject 0.25 mg into the skin once a week.   [DISCONTINUED] Semaglutide-Weight Management (WEGOVY) 0.5 MG/0.5ML SOAJ Inject 0.5 mg into the skin once a week.   [DISCONTINUED] Semaglutide-Weight Management (WEGOVY) 1 MG/0.5ML SOAJ Inject 1 mg into the skin once a week.   Allergies  Allergen Reactions   Aspirin     bleeding   Sulfa Antibiotics Hives   No results found for this or any previous visit (from the past Oct 03, 2158 hour(s)). Objective  Body mass index is 43.79 kg/m. Wt Readings from Last 3 Encounters:  01/21/22 239 lb 6.4 oz (108.6 kg)  11/17/21 239 lb (108.4 kg)  07/19/21 227 lb (103 kg)   Temp Readings from Last 3 Encounters:  01/21/22 98.5 F (36.9 C) (Oral)  07/19/21 (!) 97.1 F (36.2 C) (Temporal)  02/15/21 (!) 101.1 F (38.4 C) (Oral)   BP Readings from Last 3 Encounters:  01/21/22 140/70  07/19/21 109/78   02/15/21 134/83   Pulse Readings from Last 3 Encounters:  01/21/22 78  07/19/21 80  02/15/21 (!) 106    Physical Exam Vitals and nursing note reviewed.  Constitutional:      Appearance: Normal appearance. She is well-developed and well-groomed.  HENT:     Head: Normocephalic and atraumatic.  Eyes:     Conjunctiva/sclera: Conjunctivae  normal.     Pupils: Pupils are equal, round, and reactive to light.  Cardiovascular:     Rate and Rhythm: Normal rate and regular rhythm.     Heart sounds: Normal heart sounds. No murmur heard. Pulmonary:     Effort: Pulmonary effort is normal.     Breath sounds: Normal breath sounds.  Abdominal:     General: Abdomen is flat. Bowel sounds are normal.     Tenderness: There is no abdominal tenderness.  Musculoskeletal:        General: No tenderness.  Skin:    General: Skin is warm and dry.  Neurological:     General: No focal deficit present.     Mental Status: She is alert and oriented to person, place, and time. Mental status is at baseline.     Cranial Nerves: Cranial nerves 2-12 are intact.     Motor: Motor function is intact.     Coordination: Coordination is intact.     Gait: Gait is intact.  Psychiatric:        Attention and Perception: Attention and perception normal.        Mood and Affect: Mood and affect normal.        Speech: Speech normal.        Behavior: Behavior normal. Behavior is cooperative.        Thought Content: Thought content normal.        Cognition and Memory: Cognition and memory normal.        Judgment: Judgment normal.     Assessment  Plan  Annual physical exam - Plan: Comprehensive metabolic panel, Lipid panel, CBC with Differential/Platelet See below  Labs today  Acute cough - Plan: DG Chest 2 View, chlorpheniramine-HYDROcodone (TUSSIONEX PENNKINETIC ER) 10-8 MG/5ML  Left shoulder pain f/u pain clinic declines ortho for now  Fatigue, unspecified type - Plan: Comprehensive metabolic panel, CBC with  Differential/Platelet, TSH, Urinalysis, Routine w reflex microscopic Insulin resistance - Plan: Insulin, random  B12 and vit D   HM Flu shot utd  Tdap given 01/21/22 Consider MMR vaccine Hep B immune  Hep C 02/03/21 negative  Consider shingrix in future hold for today Declines covid 19 vaccine for now  Declines STD testing   Pap had 12/09/16 neg pap neg HPV Q5 years s/p hysterectomy h/o abnormal pap still has ovaries  01/15/21 negative repeat pap in 5 years    mammo 05/11/21 neg referred ordered    Dermatology as of 01/15/21 never seen call back when ready for referral   Disc healthy diet choices, exercise and sunscreen  rec healthy diet and exercise   Colonoscopy 07/19/21 repeat 5 years [facility] h/o tubular adenoma + and FH dad colon cancer due 01/28/21 , referred today 01/15/21      Provider: Dr. Olivia Mackie McLean-Scocuzza-[facility]

## 2022-01-21 NOTE — Patient Instructions (Addendum)
Tdap (Tetanus, Diphtheria, Pertussis) Vaccine: What You Need to Know 1. Why get vaccinated? Tdap vaccine can prevent tetanus, diphtheria, and pertussis. Diphtheria and pertussis spread from person to person. Tetanus enters the body through cuts or wounds. TETANUS (T) causes painful stiffening of the muscles. Tetanus can lead to serious health problems, including being unable to open the mouth, having trouble swallowing and breathing, or death. DIPHTHERIA (D) can lead to difficulty breathing, heart failure, paralysis, or death. PERTUSSIS (aP), also known as "whooping cough," can cause uncontrollable, violent coughing that makes it hard to breathe, eat, or drink. Pertussis can be extremely serious especially in babies and young children, causing pneumonia, convulsions, brain damage, or death. In teens and adults, it can cause weight loss, loss of bladder control, passing out, and rib fractures from severe coughing. 2. Tdap vaccine Tdap is only for children 7 years and older, adolescents, and adults.  Adolescents should receive a single dose of Tdap, preferably at age 10 or 71 years. Pregnant people should get a dose of Tdap during every pregnancy, preferably during the early part of the third trimester, to help protect the newborn from pertussis. Infants are most at risk for severe, life-threatening complications from pertussis. Adults who have never received Tdap should get a dose of Tdap. Also, adults should receive a booster dose of either Tdap or Td (a different vaccine that protects against tetanus and diphtheria but not pertussis) every 10 years, or after 5 years in the case of a severe or dirty wound or burn. Tdap may be given at the same time as other vaccines. 3. Talk with your health care provider Tell your vaccine provider if the person getting the vaccine: Has had an allergic reaction after a previous dose of any vaccine that protects against tetanus, diphtheria, or pertussis, or has any  severe, life-threatening allergies Has had a coma, decreased level of consciousness, or prolonged seizures within 7 days after a previous dose of any pertussis vaccine (DTP, DTaP, or Tdap) Has seizures or another nervous system problem Has ever had Guillain-Barr Syndrome (also called "GBS") Has had severe pain or swelling after a previous dose of any vaccine that protects against tetanus or diphtheria In some cases, your health care provider may decide to postpone Tdap vaccination until a future visit. People with minor illnesses, such as a cold, may be vaccinated. People who are moderately or severely ill should usually wait until they recover before getting Tdap vaccine.  Your health care provider can give you more information. 4. Risks of a vaccine reaction Pain, redness, or swelling where the shot was given, mild fever, headache, feeling tired, and nausea, vomiting, diarrhea, or stomachache sometimes happen after Tdap vaccination. People sometimes faint after medical procedures, including vaccination. Tell your provider if you feel dizzy or have vision changes or ringing in the ears.  As with any medicine, there is a very remote chance of a vaccine causing a severe allergic reaction, other serious injury, or death. 5. What if there is a serious problem? An allergic reaction could occur after the vaccinated person leaves the clinic. If you see signs of a severe allergic reaction (hives, swelling of the face and throat, difficulty breathing, a fast heartbeat, dizziness, or weakness), call 9-1-1 and get the person to the nearest hospital. For other signs that concern you, call your health care provider.  Adverse reactions should be reported to the Vaccine Adverse Event Reporting System (VAERS). Your health care provider will usually file this report, or you  can do it yourself. Visit the VAERS website at www.vaers.SamedayNews.es or call 573-298-5577. VAERS is only for reporting reactions, and VAERS staff  members do not give medical advice. 6. The National Vaccine Injury Compensation Program The [employer] Vaccine Injury Compensation Program (VICP) is a federal program that was created to compensate people who may have been injured by certain vaccines. Claims regarding alleged injury or death due to vaccination have a time limit for filing, which may be as short as two years. Visit the VICP website at GoldCloset.com.ee or call 940-779-5626 to learn about the program and about filing a claim. 7. How can I learn more? Ask your health care provider. Call your local or state health department. Visit the website of the Food and Drug Administration (FDA) for vaccine package inserts and additional information at TraderRating.uy. Contact the Centers for Disease Control and Prevention (CDC): Call 757-296-6147 (1-800-CDC-INFO) or Visit CDC's website at http://hunter.com/. Source: CDC Vaccine Information Statement Tdap (Tetanus, Diphtheria, Pertussis) Vaccine (01/31/2020) This same material is available at http://www.wolf.info/ for no charge. This information is not intended to replace advice given to you by your health care provider. Make sure you discuss any questions you have with your health care provider. Document Revised: 05/12/2021 Document Reviewed: 03/15/2021 Elsevier Patient Education  [place].  Cough, Adult Coughing is a reflex that clears your throat and your airways (respiratory system). Coughing helps to heal and protect your lungs. It is normal to cough occasionally, but a cough that happens with other symptoms or lasts a long time may be a sign of a condition that needs treatment. An acute cough may only last 2-3 weeks, while a chronic cough may last 8 or more weeks. Coughing is commonly caused by: Infection of the respiratory systemby viruses or bacteria. Breathing in substances that irritate your lungs. Allergies. Asthma. Mucus that runs  down the back of your throat (postnasal drip). Smoking. Acid backing up from the stomach into the esophagus (gastroesophageal reflux). Certain medicines. Chronic lung problems. Other medical conditions such as heart failure or a blood clot in the lung (pulmonary embolism). Follow these instructions at home: Medicines Take over-the-counter and prescription medicines only as told by your health care provider. Talk with your health care provider before you take a cough suppressant medicine. Lifestyle  Avoid cigarette smoke. Do not use any products that contain nicotine or tobacco, such as cigarettes, e-cigarettes, and chewing tobacco. If you need help quitting, ask your health care provider. Drink enough fluid to keep your urine pale yellow. Avoid caffeine. Do not drink alcohol if your health care provider tells you not to drink. General instructions  Pay close attention to changes in your cough. Tell your health care provider about them. Always cover your mouth when you cough. Avoid things that make you cough, such as perfume, candles, cleaning products, or campfire or tobacco smoke. If the air is dry, use a cool mist vaporizer or humidifier in your bedroom or your home to help loosen secretions. If your cough is worse at night, try to sleep in a semi-upright position. Rest as needed. Keep all follow-up visits as told by your health care provider. This is important. Contact a health care provider if you: Have new symptoms. Cough up pus. Have a cough that does not get better after 2-3 weeks or gets worse. Cannot control your cough with cough suppressant medicines and you are losing sleep. Have pain that gets worse or pain that is not helped with medicine. Have a fever. Have  unexplained weight loss. Have night sweats. Get help right away if: You cough up blood. You have difficulty breathing. Your heartbeat is very fast. These symptoms may represent a serious problem that is an  emergency. Do not wait to see if the symptoms will go away. Get medical help right away. Call your local emergency services (911 in the [place] Do not drive yourself to the hospital. Summary Coughing is a reflex that clears your throat and your airways. It is normal to cough occasionally, but a cough that happens with other symptoms or lasts a long time may be a sign of a condition that needs treatment. Take over-the-counter and prescription medicines only as told by your health care provider. Always cover your mouth when you cough. Contact a health care provider if you have new symptoms or a cough that does not get better after 2-3 weeks or gets worse. This information is not intended to replace advice given to you by your health care provider. Make sure you discuss any questions you have with your health care provider. Document Revised: 07/02/2018 Document Reviewed: 07/02/2018 Elsevier Patient Education  [place].

## 2022-01-22 LAB — URINALYSIS, ROUTINE W REFLEX MICROSCOPIC
Bilirubin Urine: NEGATIVE
Glucose, UA: NEGATIVE
Hgb urine dipstick: NEGATIVE
Ketones, ur: NEGATIVE
Leukocytes,Ua: NEGATIVE
Nitrite: NEGATIVE
Protein, ur: NEGATIVE
Specific Gravity, Urine: 1.015 (ref 1.001–1.035)
pH: 7 (ref 5.0–8.0)

## 2022-01-24 ENCOUNTER — Telehealth: Payer: Self-pay

## 2022-01-24 LAB — INSULIN, RANDOM: Insulin: 7.9 u[IU]/mL

## 2022-01-24 NOTE — Telephone Encounter (Signed)
Lvm for pt to return call in regards to lab results   Per Dr.Tracy: Cholesterol elevated  -rec healthy diet and exercise  Does pt want to try pravastatin low dose medication for cholesterol?  Vit D low rec take otc D3 4000 IU daily total  Liver kidneys normal  Blood cts normal  Thyroid and B12 normal  Urine negative  Insulin lab normal

## 2022-02-28 ENCOUNTER — Telehealth: Payer: No Typology Code available for payment source | Admitting: Physician Assistant

## 2022-02-28 DIAGNOSIS — L03113 Cellulitis of right upper limb: Secondary | ICD-10-CM | POA: Diagnosis not present

## 2022-02-28 DIAGNOSIS — T63461A Toxic effect of venom of wasps, accidental (unintentional), initial encounter: Secondary | ICD-10-CM

## 2022-02-28 MED ORDER — PREDNISONE 20 MG PO TABS: 40.0000 mg | ORAL_TABLET | Freq: Every day | ORAL | 0 refills | Status: DC

## 2022-02-28 MED ORDER — CEPHALEXIN 500 MG PO CAPS: 500.0000 mg | ORAL_CAPSULE | Freq: Four times a day (QID) | ORAL | 0 refills | Status: DC

## 2022-02-28 NOTE — Progress Notes (Signed)
E-Visit for Insect Sting  Thank you for describing the insect sting for [place].  Here is how we plan to help!  Based on the information you have shared with me it looks like you have:  A sting that we will treat with a short dose of prednisone and an antibiotic for surrounding cellulitis.  The 2 greatest risks from insect stings are allergic reaction, which can be fatal in some people and infection, which is more common and less serious.  Bees, wasps, yellow jackets, and hornets belong to a class of insects called Hymenoptera.  Most insect stings cause only minor discomfort.  Stings can happen anywhere on the body and can be painful.  Most stings are from honey bees or yellow jackets.  Fire ants can sting multiple times.  The sites of the stings are more likely to become infected.    Based on your information I have: and I have sent in prednisone 40 mg by mouth daily for 5 days and Keflex 500 mg take 1 tablet 4 times daily for 5 days to the pharmacy you selected.    What can be used to prevent Insect Stings?  Insect repellant with at least 20% DEET.  Wearing long pants and shirts with socks and shoes.  Wear dark or drab-colored clothes rather than bright colors.  Avoid using perfumes and hair sprays; these attract insects.  HOME CARE ADVICE:  1. Stinger removal: The stinger looks like a tiny black dot in the sting. Use a fingernail, credit card edge, or knife-edge to scrape it off.  Don't pull it out because it squeezes out more venom. If the stinger is below the skin surface, leave it alone.  It will be shed with normal skin healing. 2. Use cold compresses to the area of the sting for 10-20 minutes.  You may repeat this as needed to relieve symptoms of pain and swelling. 3.  For pain relief, take acetominophen 650 mg 4-6 hours as needed or ibuprofen 400 mg every 6-8 hours as needed or naproxen 250-500 mg every 12 hours as needed. 4.  You can also use hydrocortisone cream 0.5% or 1% up to  4 times daily as needed for itching. 5.  If the sting becomes very itchy, take Benadryl 25-50 mg, follow directions on box. 6.  Wash the area 2-3 times daily with antibacterial soap and warm water. 7. Call your Doctor if: Fever, a severe headache, or rash occur in the next 2 weeks. Sting area begins to look infected. Redness and swelling worsens after home treatment. Your current symptoms become worse.    MAKE SURE YOU:  Understand these instructions. Will watch your condition. Will get help right away if you are not doing well or get worse.  Thank you for choosing an e-visit.  Your e-visit answers were reviewed by a board certified advanced clinical practitioner to complete your personal care plan. Depending upon the condition, your plan could have included both over the counter or prescription medications.  Please review your pharmacy choice. Make sure the pharmacy is open so you can pick up prescription now. If there is a problem, you may contact your provider through [employer] and have the prescription routed to another pharmacy.  Your safety is important to [place]. If you have drug allergies check your prescription carefully.   For the next 24 hours you can use MyChart to ask questions about today's visit, request a non-urgent call back, or ask for a work or school excuse. You  will get an email in the next two days asking about your experience. I hope that your e-visit has been valuable and will speed your recovery.   I provided 5 minutes of non face-to-face time during this encounter for chart review and documentation.

## 2022-06-15 ENCOUNTER — Other Ambulatory Visit: Payer: Self-pay

## 2022-06-15 ENCOUNTER — Telehealth: Payer: No Typology Code available for payment source | Admitting: Family Medicine

## 2022-06-15 DIAGNOSIS — J019 Acute sinusitis, unspecified: Secondary | ICD-10-CM | POA: Diagnosis not present

## 2022-06-15 DIAGNOSIS — B9689 Other specified bacterial agents as the cause of diseases classified elsewhere: Secondary | ICD-10-CM

## 2022-06-15 MED ORDER — FLUCONAZOLE 150 MG PO TABS
150.0000 mg | ORAL_TABLET | ORAL | 0 refills | Status: DC
  Filled 2022-06-15: qty 2, 3d supply, fill #0

## 2022-06-15 MED ORDER — AMOXICILLIN-POT CLAVULANATE 875-125 MG PO TABS
1.0000 | ORAL_TABLET | Freq: Two times a day (BID) | ORAL | 0 refills | Status: AC
  Filled 2022-06-15: qty 14, 7d supply, fill #0

## 2022-06-15 NOTE — Progress Notes (Signed)
Virtual Visit Consent   Sarah [name], you are scheduled for a virtual visit with a [facility] provider today. Just as with appointments in the office, your consent must be obtained to participate. Your consent will be active for this visit and any virtual visit you may have with one of our providers in the next 365 days. If you have a MyChart account, a copy of this consent can be sent to you electronically.  As this is a virtual visit, video technology does not allow for your provider to perform a traditional examination. This may limit your provider's ability to fully assess your condition. If your provider identifies any concerns that need to be evaluated in person or the need to arrange testing (such as labs, EKG, etc.), we will make arrangements to do so. Although advances in technology are sophisticated, we cannot ensure that it will always work on either your end or our end. If the connection with a video visit is poor, the visit may have to be switched to a telephone visit. With either a video or telephone visit, we are not always able to ensure that we have a secure connection.  By engaging in this virtual visit, you consent to the provision of healthcare and authorize for your insurance to be billed (if applicable) for the services provided during this visit. Depending on your insurance coverage, you may receive a charge related to this service.  I need to obtain your verbal consent now. Are you willing to proceed with your visit today? Sarah [name] has provided verbal consent on 06/15/2022 for a virtual visit (video or telephone). Sarah Mayo, NP  Date: 06/15/2022 8:23 AM  Virtual Visit via Video Note   I, Sarah [name], connected with  Sarah [name]  ([id number], January 25, 1971) on 06/15/22 at  8:45 AM EST by a video-enabled telemedicine application and verified that I am speaking with the correct person using two identifiers.  Location: Patient: Virtual Visit Location Patient:  Home Provider: Virtual Visit Location Provider: Home Office   I discussed the limitations of evaluation and management by telemedicine and the availability of in person appointments. The patient expressed understanding and agreed to proceed.    History of Present Illness: Sarah [name] is a 51 y.o. who identifies as a female who was assigned female at birth, and is being seen today for sinus infection. Onset was a few weeks back- tired treating with OTC- without improvement. Associated symptoms- congestion, facial pain, burning in sinus, ear ache- right side, and sore throat. Denies fevers, chills, chest pain, shortness of breath, covid home test negative.   Problems:  Patient Active Problem List   Diagnosis Date Noted   Acute right lumbar radiculopathy 12/01/2021   Acute right-sided low back pain with right-sided sciatica 12/01/2021   BMI 40.0-44.9, adult ([place]) 11/17/2021   Allergy, unspecified, initial encounter 08/09/2021   Acute sinusitis, unspecified 08/09/2021   Personal history of urinary (tract) infections 08/09/2021   Symptomatic cholelithiasis 01/16/2020   Kidney stone on left side 01/16/2020   Ventral hernia 41/66/0630   Umbilical hernia 16/06/930   Diverticulosis 01/16/2020   OSA on CPAP 12/15/2019   Trigger point with back pain (Rhomboid area) 11/18/2019   Acute upper back pain 11/18/2019   Rhomboid muscle strain, initial encounter 11/18/2019   Left lumbar radiculopathy 06/14/2019   DDD (degenerative disc disease), lumbar 06/14/2019   Spondylosis 06/14/2019   Insomnia 06/14/2019   Chronic pain syndrome 10/08/2018   Class 3 severe  obesity due to excess calories without serious comorbidity with body mass index (BMI) of 40.0 to 44.9 in adult [facility]) 12/15/2017   Annual physical exam 12/09/2016   Cervical facet hypertrophy 12/07/2016   Osteoarthritis of cervical spine 12/07/2016   Cervical facet syndrome (HCC) (Left) 12/07/2016   Cervical radiculitis (Left) 12/07/2016    Myofascial pain syndrome, cervical 12/07/2016   Musculoskeletal pain 12/07/2016   Neurogenic pain 12/07/2016   Chronic hip pain (Left) 03/15/2016   History of nephrolithiasis 01/26/2016   Chronic knee pain (Left) 11/17/2015   Morbid obesity ([place]) 09/20/2015   Gastro-esophageal reflux disease without esophagitis 09/20/2015    Allergies:  Allergies  Allergen Reactions   Aspirin     bleeding   Sulfa Antibiotics Hives   Medications:  Current Outpatient Medications:    b complex vitamins tablet, Take 1 tablet by mouth daily., Disp: , Rfl:    cephALEXin (KEFLEX) 500 MG capsule, Take 1 capsule (500 mg total) by mouth 4 (four) times daily., Disp: 20 capsule, Rfl: 0   chlorpheniramine-HYDROcodone (TUSSIONEX PENNKINETIC ER) 10-8 MG/5ML, Take 5 mLs by mouth at bedtime as needed., Disp: 115 mL, Rfl: 0   cholecalciferol (VITAMIN D3) 25 MCG (1000 UNIT) tablet, Take 1,000 Units by mouth daily., Disp: , Rfl:    fluconazole (DIFLUCAN) 150 MG tablet, Take 1 tablet (150 mg total) by mouth daily. Take one tablet today.  May repeat in 3 days., Disp: 2 tablet, Rfl: 0   loratadine (CLARITIN) 10 MG tablet, Take 10 mg by mouth daily as needed for allergies., Disp: , Rfl:    magnesium oxide (MAG-OX) 400 MG tablet, Take 400 mg by mouth daily., Disp: , Rfl:    ondansetron (ZOFRAN) 4 MG tablet, Take 1 tablet (4 mg total) by mouth every 8 (eight) hours as needed., Disp: 40 tablet, Rfl: 2   pantoprazole (PROTONIX) 40 MG tablet, Take 1 tablet (40 mg total) by mouth daily. 30 minutes before, Disp: 90 tablet, Rfl: 3   predniSONE (DELTASONE) 20 MG tablet, Take 2 tablets (40 mg total) by mouth daily with breakfast., Disp: 10 tablet, Rfl: 0   Semaglutide-Weight Management (WEGOVY) 1.7 MG/0.75ML SOAJ, Inject 1.7 mg into the skin once a week., Disp: 3 mL, Rfl: 0   Semaglutide-Weight Management (WEGOVY) 2.4 MG/0.75ML SOAJ, Inject 2.4 mg into the skin once a week., Disp: 3 mL, Rfl: 0  Observations/Objective: Patient is  well-developed, well-nourished in no acute distress.  Resting comfortably  at home.  Head is normocephalic, atraumatic.  No labored breathing.  Speech is clear and coherent with logical content.  Patient is alert and oriented at baseline.    Assessment and Plan:  1. Acute bacterial sinusitis  - amoxicillin-clavulanate (AUGMENTIN) 875-125 MG tablet; Take 1 tablet by mouth 2 (two) times daily for 7 days.  Dispense: 14 tablet; Refill: 0 - fluconazole (DIFLUCAN) 150 MG tablet; Take 1 tablet (150 mg total) by mouth as directed. May repeat in 3 days  Dispense: 2 tablet; Refill: 0   -Take meds as prescribed -Rest -Use a cool mist humidifier especially during the winter months when heat dries out the air. - Use saline nose sprays frequently to help soothe nasal passages and promote drainage. -Saline irrigations of the nose can be very helpful if done frequently.             * 4X daily for 1 week*             * Use of a nettie pot can be helpful with  this.  *Follow directions with this* *Boiled or distilled water only -stay hydrated by drinking plenty of fluids - Keep thermostat turn down low to prevent drying out sinuses - For any cough or congestion- robitussin DM or Delsym as needed - For fever or aches or pains- take tylenol or ibuprofen as directed on bottle             * for fevers greater than 101 orally you may alternate ibuprofen and tylenol every 3 hours.  If you do not improve you will need a follow up visit in person.                Reviewed side effects, risks and benefits of medication.     Patient acknowledged agreement and understanding of the plan.   Past Medical, Surgical, Social History, Allergies, and Medications have been Reviewed.   Follow Up Instructions: I discussed the assessment and treatment plan with the patient. The patient was provided an opportunity to ask questions and all were answered. The patient agreed with the plan and demonstrated an  understanding of the instructions.  A copy of instructions were sent to the patient via MyChart unless otherwise noted below.    The patient was advised to call back or seek an in-person evaluation if the symptoms worsen or if the condition fails to improve as anticipated.  Time:  I spent 10 minutes with the patient via telehealth technology discussing the above problems/concerns.    Sarah Mayo, NP

## 2022-06-15 NOTE — Patient Instructions (Signed)
Sarah [name], thank you for joining Perlie Mayo, NP for today's virtual visit.  While this provider is not your primary care provider (PCP), if your PCP is located in our provider database this encounter information will be shared with them immediately following your visit.   [facility] account gives you access to today's visit and all your visits, tests, and labs performed at [facility] " click here if you don't have a [facility] account or go to [email or web address]  Consent: (Patient) Sarah [name] provided verbal consent for this virtual visit at the beginning of the encounter.  Current Medications:  Current Outpatient Medications:    amoxicillin-clavulanate (AUGMENTIN) 875-125 MG tablet, Take 1 tablet by mouth 2 (two) times daily for 7 days., Disp: 14 tablet, Rfl: 0   fluconazole (DIFLUCAN) 150 MG tablet, Take 1 tablet (150 mg total) by mouth as directed. May repeat in 3 days, Disp: 2 tablet, Rfl: 0   b complex vitamins tablet, Take 1 tablet by mouth daily., Disp: , Rfl:    chlorpheniramine-HYDROcodone (TUSSIONEX PENNKINETIC ER) 10-8 MG/5ML, Take 5 mLs by mouth at bedtime as needed., Disp: 115 mL, Rfl: 0   cholecalciferol (VITAMIN D3) 25 MCG (1000 UNIT) tablet, Take 1,000 Units by mouth daily., Disp: , Rfl:    loratadine (CLARITIN) 10 MG tablet, Take 10 mg by mouth daily as needed for allergies., Disp: , Rfl:    magnesium oxide (MAG-OX) 400 MG tablet, Take 400 mg by mouth daily., Disp: , Rfl:    ondansetron (ZOFRAN) 4 MG tablet, Take 1 tablet (4 mg total) by mouth every 8 (eight) hours as needed., Disp: 40 tablet, Rfl: 2   pantoprazole (PROTONIX) 40 MG tablet, Take 1 tablet (40 mg total) by mouth daily. 30 minutes before, Disp: 90 tablet, Rfl: 3   predniSONE (DELTASONE) 20 MG tablet, Take 2 tablets (40 mg total) by mouth daily with breakfast., Disp: 10 tablet, Rfl: 0   Semaglutide-Weight Management (WEGOVY) 1.7 MG/0.75ML SOAJ, Inject 1.7 mg into  the skin once a week., Disp: 3 mL, Rfl: 0   Semaglutide-Weight Management (WEGOVY) 2.4 MG/0.75ML SOAJ, Inject 2.4 mg into the skin once a week., Disp: 3 mL, Rfl: 0   Medications ordered in this encounter:  Meds ordered this encounter  Medications   amoxicillin-clavulanate (AUGMENTIN) 875-125 MG tablet    Sig: Take 1 tablet by mouth 2 (two) times daily for 7 days.    Dispense:  14 tablet    Refill:  0    Order Specific Question:   Supervising Provider    Answer:   Chase Picket [[id number]]   fluconazole (DIFLUCAN) 150 MG tablet    Sig: Take 1 tablet (150 mg total) by mouth as directed. May repeat in 3 days    Dispense:  2 tablet    Refill:  0    Order Specific Question:   Supervising Provider    Answer:   Chase Picket [[id number]]     *If you need refills on other medications prior to your next appointment, please contact your pharmacy*  Follow-Up: Call back or seek an in-person evaluation if the symptoms worsen or if the condition fails to improve as anticipated.  [place] [phone]  Other Instructions  -Take meds as prescribed -Rest -Use a cool mist humidifier especially during the winter months when heat dries out the air. - Use saline nose sprays frequently to help soothe nasal passages and promote drainage. -Saline irrigations of  the nose can be very helpful if done frequently.             * 4X daily for 1 week*             * Use of a nettie pot can be helpful with this.  *Follow directions with this* *Boiled or distilled water only -stay hydrated by drinking plenty of fluids - Keep thermostat turn down low to prevent drying out sinuses - For any cough or congestion- robitussin DM or Delsym as needed - For fever or aches or pains- take tylenol or ibuprofen as directed on bottle             * for fevers greater than 101 orally you may alternate ibuprofen and tylenol every 3 hours.  If you do not improve you will need a follow up visit in  person.                  If you have been instructed to have an in-person evaluation today at a local Urgent Care facility, please use the link below. It will take you to a list of all of our available [facility], including address, phone number and hours of operation. Please do not delay care.  [facility]  If you or a family member do not have a primary care provider, use the link below to schedule a visit and establish care. When you choose a [facility] primary care physician or advanced practice provider, you gain a long-term partner in health. Find a Primary Care Provider  Learn more about [facility]'s in-office and virtual care options: [facility]

## 2022-06-23 ENCOUNTER — Telehealth: Payer: Self-pay | Admitting: Adult Health

## 2022-06-23 NOTE — Telephone Encounter (Signed)
ATC Patient. LVM to return call.

## 2022-06-23 NOTE — Telephone Encounter (Signed)
pt stated that cpap machine died, [facility] stated pt needed order sent over for new one. pt LOV on 08/20/2019.I advised pt she may need to be seen but wanted me to double check. pt is wondering if order could be sent to [facility] if not she would like mychart visit  [phone]

## 2022-06-23 NOTE — Telephone Encounter (Signed)
I spoke with the patient. I let her know we will have to see her back in the office before we can order her another machine. She asked if there is anyway she can do a Video Visit? She said her dog just had surgery and she can not leave him alone.   Sarah [name] will you advise since [name] is our of the office? Thank you!

## 2022-06-24 NOTE — Telephone Encounter (Signed)
Report in [facility] stops 09/2020, this may be due to 3G shut off.  Lm for Melissa with [facility] to verify.

## 2022-06-24 NOTE — Telephone Encounter (Signed)
As long as we can get a compliance report  we can do virtual video visit to get her new machine

## 2022-06-28 NOTE — Telephone Encounter (Addendum)
Spoke to [place] with [facility]. She stated that it appears that patient is currently using late husbands machine.  Patient will need to bring in [place] card for download.  Spoke to patient and relayed above message. She stated that she is unsure if machine has SD card, however she will check when she gets home.  In office appt scheduled 07/29/2022 at 11:30. Offered sooner appt in [place] with NP and patient declined.  Nothing further needed.

## 2022-07-28 ENCOUNTER — Encounter: Payer: Self-pay | Admitting: Family Medicine

## 2022-07-28 ENCOUNTER — Ambulatory Visit (INDEPENDENT_AMBULATORY_CARE_PROVIDER_SITE_OTHER): Payer: 59 | Admitting: Family Medicine

## 2022-07-28 ENCOUNTER — Telehealth: Payer: Self-pay

## 2022-07-28 VITALS — BP 126/84 | HR 77 | Temp 98.1°F | Ht 62.0 in | Wt 248.0 lb

## 2022-07-28 DIAGNOSIS — Z1231 Encounter for screening mammogram for malignant neoplasm of breast: Secondary | ICD-10-CM | POA: Diagnosis not present

## 2022-07-28 DIAGNOSIS — Z6841 Body Mass Index (BMI) 40.0 and over, adult: Secondary | ICD-10-CM

## 2022-07-28 DIAGNOSIS — K5790 Diverticulosis of intestine, part unspecified, without perforation or abscess without bleeding: Secondary | ICD-10-CM

## 2022-07-28 DIAGNOSIS — G894 Chronic pain syndrome: Secondary | ICD-10-CM

## 2022-07-28 DIAGNOSIS — M5136 Other intervertebral disc degeneration, lumbar region: Secondary | ICD-10-CM

## 2022-07-28 NOTE — Telephone Encounter (Signed)
Noted.  Will close encounter.

## 2022-07-28 NOTE — Patient Instructions (Addendum)
It was a pleasure meeting you today. Thank you for allowing me to take part in your health care.  Our goals for today as we discussed include:  Schedule annual appointment  Schedule fasting lab appointment for 1 week prior to office visit  Referral sent for Mammogram. Please call to schedule appointment. [facility] [place], [place] [address] [phone]    If you have any questions or concerns, please do not hesitate to call the office at [phone].  I look forward to our next visit and until then take care and stay safe.  Regards,   Carollee Leitz, MD   [facility]

## 2022-07-28 NOTE — Progress Notes (Signed)
   SUBJECTIVE:   Chief Complaint  Patient presents with   Establish Care    Transfer of Care   HPI Patient presents to clinic to transfer care.  No acute concerns.  Not currently on any medications.  Works at [employer].  Obesity class III.   Has been unable to obtain [facility] to help with weight loss. Has now decided to try [facility] program.  Recently started 2 weeks ago.  Also taking citrus bergamot daily.   PERTINENT PMH / PSH: OSA on CPAP GERD Obesity class III   OBJECTIVE:  BP 126/84   Pulse 77   Temp 98.1 F (36.7 C)   Ht '5\' 2"'$  (1.575 m)   Wt 248 lb (112.5 kg)   SpO2 99%   BMI 45.36 kg/m    Physical Exam Vitals reviewed.  Constitutional:      General: She is not in acute distress.    Appearance: She is not ill-appearing.  HENT:     Head: Normocephalic.     Nose: Nose normal.     Mouth/Throat:     Mouth: Mucous membranes are moist.  Eyes:     Conjunctiva/sclera: Conjunctivae normal.  Cardiovascular:     Rate and Rhythm: Normal rate and regular rhythm.     Heart sounds: Normal heart sounds.  Pulmonary:     Effort: Pulmonary effort is normal.     Breath sounds: Normal breath sounds.  Abdominal:     General: Abdomen is flat. Bowel sounds are normal.     Palpations: Abdomen is soft.  Musculoskeletal:        General: Normal range of motion.     Cervical back: Normal range of motion.     Right lower leg: No edema.     Left lower leg: No edema.  Skin:    General: Skin is warm.     Capillary Refill: Capillary refill takes less than 2 seconds.  Neurological:     Mental Status: She is alert and oriented to person, place, and time. Mental status is at baseline.  Psychiatric:        Mood and Affect: Mood normal.        Behavior: Behavior normal.        Thought Content: Thought content normal.        Judgment: Judgment normal.     ASSESSMENT/PLAN:  Breast cancer screening by mammogram -     3D Screening Mammogram, Left and Right; Future  Class 3  severe obesity due to excess calories without serious comorbidity with body mass index (BMI) of 40.0 to 44.9 in adult [facility]) Assessment & Plan: Mancel Parsons remains on backorder.  Patient not started medication. Discontinue Wegovy Continue healthy lifestyle changes.  Orders: -     Hemoglobin A1c; Future -     Comprehensive metabolic panel; Future -     CBC with Differential/Platelet; Future -     Lipid panel; Future -     VITAMIN D 25 Hydroxy (Vit-D Deficiency, Fractures); Future -     Vitamin B12; Future   HCM Last Pap 7/22.  TAH 2002 for DUB.   Patient reports history of stage IV cervical dysplasia.  If indicated will need Paps every 5 years. Referral for yearly mammogram Colonoscopy due 2028 Tdap up-to-date Recommend shingles vaccine   PDMP reviewed  Return in about 5 months (around 12/26/2022) for annual visit with fasting labs 1 week prior.  Carollee Leitz, MD

## 2022-07-28 NOTE — Telephone Encounter (Signed)
Lm for patient to request that she bring SD card to visit.

## 2022-07-29 ENCOUNTER — Encounter: Payer: Self-pay | Admitting: Primary Care

## 2022-07-29 ENCOUNTER — Ambulatory Visit: Payer: 59 | Admitting: Primary Care

## 2022-07-29 VITALS — BP 122/68 | HR 77 | Temp 97.7°F | Ht 62.0 in | Wt 250.6 lb

## 2022-07-29 DIAGNOSIS — G4733 Obstructive sleep apnea (adult) (pediatric): Secondary | ICD-10-CM | POA: Diagnosis not present

## 2022-07-29 NOTE — Progress Notes (Signed)
$'@Patient't$  ID: Sarah [name], female    DOB: 08-29-1970, 52 y.o.   MRN: [id number]  Chief Complaint  Patient presents with   Follow-up    Needs new cpap machine. Wearing cpap avg 8hr nightly. Pressure and mask is okay.     Referring provider: McLean-Scocuzza, Olivia Mackie *  HPI: 52 year old female, former smoker. PMH significant for OSA on CPAP, GERD, obesity.   07/29/2022 - Interim hx  Patient presents today for overdue follow-up. HST 10/10/19 showed moderate OSA, AHI 19.3/hour with SpO2 low 71%.   She is doing well. She reports compliant with CPAP use nightly. She does not particularly like wearing CPAP but does sleep well when using. Using nasal pillow mask. She has tried several different mask, can not tolerate full face mask.  Her original sleep study was done in April 2021 which showed moderate OSA, AHI 19.3/hour with SpO2 low 71%. She has a second hand CPAP machine available to her and was able to get set up with this machine. Current pressure settings 5-15cm h20. She received a message that machine has exceeded motor life. DME is [facility]    Allergies  Allergen Reactions   Aspirin     bleeding   Sulfa Antibiotics Hives    Immunization History  Administered Date(s) Administered   Influenza,inj,Quad PF,6+ Mos 04/11/2022   Influenza-Unspecified 04/08/2015, 04/03/2018, 04/09/2019, 03/27/2020, 04/08/2021, 04/04/2022   Tdap 12/07/2011, 01/21/2022    Past Medical History:  Diagnosis Date   Allergy    Chicken pox    Chronic pain    GERD (gastroesophageal reflux disease)    History of kidney stones    Hyperlipidemia    PONV (postoperative nausea and vomiting)    Shingles    age 2   Sleep apnea    uses cpap machine    Surgical site infection 2022    Tobacco History: Social History   Tobacco Use  Smoking Status Former   Packs/day: 0.50   Years: 10.00   Total pack years: 5.00   Types: Cigarettes   Quit date: 06/27/2010   Years since quitting: 12.0  Smokeless Tobacco Never   Tobacco Comments   quit 2015   Counseling given: Not Answered Tobacco comments: quit 2015   Outpatient Medications Prior to Visit  Medication Sig Dispense Refill   b complex vitamins tablet Take 1 tablet by mouth daily.     cholecalciferol (VITAMIN D3) 25 MCG (1000 UNIT) tablet Take 1,000 Units by mouth daily.     loratadine (CLARITIN) 10 MG tablet Take 10 mg by mouth daily as needed for allergies.     magnesium oxide (MAG-OX) 400 MG tablet Take 400 mg by mouth daily.     fluconazole (DIFLUCAN) 150 MG tablet Take 1 tablet (150 mg total) by mouth as directed. May repeat in 3 days 2 tablet 0   pantoprazole (PROTONIX) 40 MG tablet Take 1 tablet (40 mg total) by mouth daily. 30 minutes before 90 tablet 3   No facility-administered medications prior to visit.   Review of Systems  Review of Systems  Constitutional: Negative.   HENT: Negative.    Respiratory: Negative.    Cardiovascular: Negative.    Physical Exam  BP 122/68 (BP Location: Left Arm, Cuff Size: Large)   Pulse 77   Temp 97.7 F (36.5 C) (Temporal)   Ht '5\' 2"'$  (1.575 m)   Wt 250 lb 9.6 oz (113.7 kg)   SpO2 97%   BMI 45.84 kg/m  Physical Exam Constitutional:  General: She is not in acute distress.    Appearance: Normal appearance. She is not ill-appearing.  HENT:     Head: Normocephalic and atraumatic.     Mouth/Throat:     Mouth: Mucous membranes are moist.     Pharynx: Oropharynx is clear.  Cardiovascular:     Rate and Rhythm: Normal rate and regular rhythm.  Pulmonary:     Effort: Pulmonary effort is normal.     Breath sounds: Normal breath sounds.  Musculoskeletal:        General: Normal range of motion.  Skin:    General: Skin is warm and dry.  Neurological:     General: No focal deficit present.     Mental Status: She is alert and oriented to person, place, and time. Mental status is at baseline.  Psychiatric:        Mood and Affect: Mood normal.        Behavior: Behavior normal.         Thought Content: Thought content normal.        Judgment: Judgment normal.      Lab Results:  CBC    Component Value Date/Time   WBC 7.0 01/21/2022 0935   RBC 4.40 01/21/2022 0935   HGB 13.3 01/21/2022 0935   HGB 12.7 02/15/2021 1014   HCT 39.8 01/21/2022 0935   HCT 36.5 02/15/2021 1014   PLT 258.0 01/21/2022 0935   PLT 212 02/15/2021 1014   MCV 90.4 01/21/2022 0935   MCV 85 02/15/2021 1014   MCH 29.5 02/15/2021 1014   MCH 29.5 03/31/2020 0755   MCHC 33.4 01/21/2022 0935   RDW 15.1 01/21/2022 0935   RDW 13.5 02/15/2021 1014   LYMPHSABS 3.0 01/21/2022 0935   LYMPHSABS 0.7 02/15/2021 1014   MONOABS 0.5 01/21/2022 0935   EOSABS 0.2 01/21/2022 0935   EOSABS 0.1 02/15/2021 1014   BASOSABS 0.0 01/21/2022 0935   BASOSABS 0.0 02/15/2021 1014    BMET    Component Value Date/Time   NA 141 01/21/2022 0935   NA 138 02/15/2021 1014   NA 141 03/11/2014 0945   K 4.4 01/21/2022 0935   K 4.0 03/11/2014 0945   CL 106 01/21/2022 0935   CL 107 03/11/2014 0945   CO2 28 01/21/2022 0935   CO2 25 03/11/2014 0945   GLUCOSE 84 01/21/2022 0935   GLUCOSE 83 03/11/2014 0945   BUN 16 01/21/2022 0935   BUN 16 02/15/2021 1014   BUN 18 03/11/2014 0945   CREATININE 0.80 01/21/2022 0935   CREATININE 0.76 03/11/2014 0945   CALCIUM 9.3 01/21/2022 0935   CALCIUM 8.8 03/11/2014 0945   GFRNONAA >60 03/31/2020 0755   GFRNONAA >60 03/11/2014 0945   GFRAA >60 02/25/2019 0751   GFRAA >60 03/11/2014 0945    BNP No results found for: "BNP"  ProBNP No results found for: "PROBNP"  Imaging: No results found.   Assessment & Plan:   OSA on CPAP - Hx moderate OSA. Patient reports compliance with CPAP, using second hand machine. Received message motor life had been exceeded. Current settings 5-15cm h20. No download available in [facility]. Original sleep study was done in April 2021. We will place an order for patient to be provided CPAP at current settings with mask of choice. DME is [facility].  Advised patient continue to wear CPAP every night 4-6 hours. Encourage weight loss efforts. FU in 3 months or sooner if needed.    Martyn Ehrich, NP 07/29/2022

## 2022-07-29 NOTE — Patient Instructions (Addendum)
  We may need to to bring your CPAP in to [facility] for them to pull a download from your machine before we are able to get you a new CPAP  Recommendations: Continue to wear CPAP every night for minimum 4 to 6 hours or longer Continue work on weight loss efforts as able Avoid back sleeping position  Orders: New CPAP machine auto setting 5-15cm h20 Mask of choice   Follow-up: 3 months with [place] or [name] in [place]

## 2022-07-29 NOTE — Assessment & Plan Note (Addendum)
-   Hx moderate OSA. Patient reports compliance with CPAP, using second hand machine. Received message motor life had been exceeded. Current settings 5-15cm h20. No download available in [place]. Original sleep study was done in April 2021. We will place an order for patient to be provided CPAP at current settings with mask of choice. DME is [employer]. Advised patient continue to wear CPAP every night 4-6 hours. Encourage weight loss efforts. FU in 3 months or sooner if needed.

## 2022-07-30 ENCOUNTER — Encounter: Payer: Self-pay | Admitting: Family Medicine

## 2022-07-30 DIAGNOSIS — Z1231 Encounter for screening mammogram for malignant neoplasm of breast: Secondary | ICD-10-CM | POA: Insufficient documentation

## 2022-07-30 DIAGNOSIS — Z1321 Encounter for screening for nutritional disorder: Secondary | ICD-10-CM | POA: Insufficient documentation

## 2022-07-30 NOTE — Assessment & Plan Note (Signed)
Sarah [name] remains on backorder.  Patient not started medication. Discontinue Wegovy Continue healthy lifestyle changes.

## 2022-07-30 NOTE — Progress Notes (Signed)
Reviewed and agree with assessment/plan.   Chesley Mires, MD [facility]/Critical Care 07/30/2022, 4:16 PM Pager:  [phone]

## 2022-08-12 DIAGNOSIS — G4733 Obstructive sleep apnea (adult) (pediatric): Secondary | ICD-10-CM | POA: Diagnosis not present

## 2022-08-17 ENCOUNTER — Ambulatory Visit
Admission: RE | Admit: 2022-08-17 | Discharge: 2022-08-17 | Disposition: A | Discharge status: Home / Self Care | Payer: 59 | Source: Ambulatory Visit | Attending: Family Medicine | Admitting: Family Medicine

## 2022-08-17 DIAGNOSIS — Z1231 Encounter for screening mammogram for malignant neoplasm of breast: Secondary | ICD-10-CM | POA: Insufficient documentation

## 2022-08-25 DIAGNOSIS — G4733 Obstructive sleep apnea (adult) (pediatric): Secondary | ICD-10-CM | POA: Diagnosis not present

## 2022-09-03 ENCOUNTER — Ambulatory Visit
Admission: RE | Admit: 2022-09-03 | Discharge: 2022-09-03 | Disposition: A | Discharge status: Home / Self Care | Payer: 59 | Source: Ambulatory Visit | Attending: Emergency Medicine | Admitting: Emergency Medicine

## 2022-09-03 VITALS — BP 131/103 | HR 84 | Temp 98.4°F

## 2022-09-03 DIAGNOSIS — H60391 Other infective otitis externa, right ear: Secondary | ICD-10-CM

## 2022-09-03 MED ORDER — NEOMYCIN-POLYMYXIN-HC 3.5-10000-1 OT SUSP: 4.0000 [drp] | Freq: Three times a day (TID) | OTIC | 0 refills | Status: DC

## 2022-09-03 NOTE — ED Triage Notes (Signed)
Pt c/o ear fullness in right ear x 5 days. She states she is having trouble closing her mouth and has pain along her jaw, in her sinuses. Pt states she had been taking naproxen and using OTC ear drops to soften the wax. She states on Monday she used a bulb syringe to flush her ears.

## 2022-09-03 NOTE — ED Provider Notes (Signed)
[facility]    CSN: [id number] Arrival date & time: 09/03/22  1140      History   Chief Complaint Chief Complaint  Patient presents with   Ear Fullness    Ear pain,  pressure - Entered by patient    HPI Sarah [name] is a 52 y.o. female.   HPI  52 year old female here for evaluation of right ear pain.  The patient has a past medical history that significant for osteoarthritis, OSA on CPAP, diverticulosis, and GERD presenting for evaluation of 5 days worth of fullness and pain in her right ear with muffled hearing.  She reports that when she chews that the pain in her right ear increases.  She denies fever or drainage.  She states that the symptoms began after she used over-the-counter earwax removal drops that her PCP recommended.  Past Medical History:  Diagnosis Date   Acute right lumbar radiculopathy 12/01/2021   (12/01/2021) patient was moving some furniture this past weekend and has developed a right lower extremity pain with radicular components.  Steroid taper pack called in.   Acute right-sided low back pain with right-sided sciatica 12/01/2021   (12/01/2021) pain started after the patient was moving some furniture this past weekend.  Primary pain seems to be that of the lower extremity.  Steroid taper called in.   Acute sinusitis, unspecified 08/09/2021   Acute upper back pain 11/18/2019   Allergy    Annual physical exam 12/09/2016   BMI 40.0-44.9, adult ([place]) 11/17/2021   Cervical facet hypertrophy 12/07/2016   Cervical facet syndrome (HCC) (Left) 12/07/2016   Cervical radiculitis (Left) 12/07/2016   Chicken pox    Chronic hip pain (Left) 03/15/2016   Chronic knee pain (Left) 11/17/2015   Chronic pain    Chronic pain syndrome 10/08/2018   GERD (gastroesophageal reflux disease)    History of kidney stones    Hyperlipidemia    Insomnia 06/14/2019   Kidney stone on left side 01/16/2020   Left lumbar radiculopathy 06/14/2019   Morbid obesity ([place])  09/20/2015   Musculoskeletal pain 12/07/2016   Myofascial pain syndrome, cervical 12/07/2016   Neurogenic pain 12/07/2016   Personal history of urinary (tract) infections 08/09/2021   PONV (postoperative nausea and vomiting)    Rhomboid muscle strain, initial encounter 11/18/2019   Shingles    age 19   Sleep apnea    uses cpap machine    Surgical site infection 2022   Symptomatic cholelithiasis 01/16/2020   Trigger point with back pain (Rhomboid area) [id number]   Umbilical hernia [id number]   Ventral hernia 01/16/2020    Patient Active Problem List   Diagnosis Date Noted   Breast cancer screening by mammogram 07/30/2022   Allergy, unspecified, initial encounter 08/09/2021   Diverticulosis 01/16/2020   OSA on CPAP 12/15/2019   DDD (degenerative disc disease), lumbar 06/14/2019   Spondylosis 06/14/2019   Class 3 severe obesity due to excess calories without serious comorbidity with body mass index (BMI) of 40.0 to 44.9 in adult [facility]) 12/15/2017   Osteoarthritis of cervical spine 12/07/2016   History of nephrolithiasis 01/26/2016   Gastro-esophageal reflux disease without esophagitis 09/20/2015    Past Surgical History:  Procedure Laterality Date   ABDOMINAL HYSTERECTOMY  01/2001   DUB h/o abnormal pap ovaries intact age early 1s abnormal pap   CESAREAN SECTION  12/1987   CESAREAN SECTION  05/1993   CHOLECYSTECTOMY     COLONOSCOPY N/A 07/19/2021   Procedure: COLONOSCOPY;  Surgeon: Volney American  Legrand Como, DO;  Location: [facility] ENDOSCOPY;  Service: Gastroenterology;  Laterality: N/A;   COLONOSCOPY WITH PROPOFOL N/A 01/29/2016   Procedure: COLONOSCOPY WITH PROPOFOL;  Surgeon: Manya Silvas, MD;  Location[facility]COPY;  Service: Endoscopy;  Laterality: N/A;   HERNIA REPAIR  2022   LITHOTRIPSY  [id number]   UMBILICAL HERNIA REPAIR N/A 11/12/2020   Procedure: HERNIA REPAIR UMBILICAL ADULT, open;  Surgeon: Olean Ree, MD;  Location: [facility] ORS;  Service: General;  Laterality: N/A;     OB History   No obstetric history on file.      Home Medications    Prior to Admission medications   Medication Sig Start Date End Date Taking? Authorizing Provider  neomycin-polymyxin-hydrocortisone (CORTISPORIN) 3.5-10000-1 OTIC suspension Place 4 drops into the right ear 3 (three) times daily. 09/03/22  Yes Margarette Canada, NP  b complex vitamins tablet Take 1 tablet by mouth daily.    [provider]  cholecalciferol (VITAMIN D3) 25 MCG (1000 UNIT) tablet Take 1,000 Units by mouth daily. With Vitamin K3    [provider]  loratadine (CLARITIN) 10 MG tablet Take 10 mg by mouth daily as needed for allergies.    [provider]  magnesium oxide (MAG-OX) 400 MG tablet Take 400 mg by mouth daily.    [provider]    Family History Family History  Problem Relation Age of Onset   Breast cancer Mother 109   Cancer Mother        breast   Colon cancer Father 49   Lung cancer Father    Diabetes Father    Hypertension Paternal Grandfather    Breast cancer Maternal Grandmother        81's   Cancer Maternal Grandmother        breast    Cancer Cousin        breast     Social History Social History   Tobacco Use   Smoking status: Former    Packs/day: 0.50    Years: 10.00    Total pack years: 5.00    Types: Cigarettes    Quit date: 06/27/2010    Years since quitting: 12.1   Smokeless tobacco: Never   Tobacco comments:    quit 2015  Vaping Use   Vaping Use: Former   Quit date: 02/27/2017   Devices: quit 2018  Substance Use Topics   Alcohol use: Yes    Alcohol/week: 0.0 standard drinks of alcohol    Comment: occasional and very rarely    Drug use: No     Allergies   Aspirin and Sulfa antibiotics   Review of Systems Review of Systems  Constitutional:  Negative for fever.  HENT:  Positive for ear pain and hearing loss. Negative for ear discharge and tinnitus.      Physical Exam Triage Vital Signs ED Triage Vitals [09/03/22  1208]  Enc Vitals Group     BP      Pulse      Resp      Temp      Temp src      SpO2      Weight      Height      Head Circumference      Peak Flow      Pain Score 4     Pain Loc      Pain Edu?      Excl. in [place]?    No data found.  Updated Vital Signs BP (!) 131/103 (  BP Location: Right Arm)   Pulse 84   Temp 98.4 F (36.9 C) (Oral)   SpO2 100%   Visual Acuity Right Eye Distance:   Left Eye Distance:   Bilateral Distance:    Right Eye Near:   Left Eye Near:    Bilateral Near:     Physical Exam Vitals and nursing note reviewed.  Constitutional:      Appearance: Normal appearance. She is not ill-appearing.  HENT:     Right Ear: External ear normal. There is no impacted cerumen.     Left Ear: Tympanic membrane, ear canal and external ear normal. There is no impacted cerumen.     Ears:     Comments: The right EAC is markedly edematous but free of erythema or discharge.  I am able to visualize the tympanic membrane which is pearly gray in appearance.  The patient does have tenderness with movement of the auricle of the ear as well as with palpation of the tragus and the right TMJ.  Left EAC is patent and the left TM is pearly gray in appearance. Skin:    General: Skin is warm and dry.     Capillary Refill: Capillary refill takes less than 2 seconds.  Neurological:     General: No focal deficit present.     Mental Status: She is alert and oriented to person, place, and time.  Psychiatric:        Mood and Affect: Mood normal.        Behavior: Behavior normal.        Thought Content: Thought content normal.        Judgment: Judgment normal.      UC Treatments / Results  Labs (all labs ordered are listed, but only abnormal results are displayed) Labs Reviewed - No data to display  EKG   Radiology No results found.  Procedures Procedures (including critical care time)  Medications Ordered in UC Medications - No data to display  Initial Impression /  Assessment and Plan / UC Course  I have reviewed the triage vital signs and the nursing notes.  Pertinent labs & imaging results that were available during my care of the patient were reviewed by me and considered in my medical decision making (see chart for details).   Patient is a pleasant, nontoxic-appearing 52 year old female here for evaluation of right ear pain and muffled hearing as outlined in HPI above.  Her right EAC is markedly edematous but free of erythema or discharge.  The TM is pearly gray in appearance.  The patient reports that her symptoms began after she used over-the-counter earwax removal drops.  I am suspicious that she may have had an allergic reaction leading to an otitis externa.  I will treat her with Cortisporin otic, 4 drops in the right ear 3 times a day for 5 days, for treatment of otitis externa.  Over-the-counter Tylenol and/or ibuprofen according to package instructions as needed for pain.  I also discussed using a heating pad or hot water bottle to help soothe the ear.  Return precautions reviewed.   Final Clinical Impressions(s) / UC Diagnoses   Final diagnoses:  Infective otitis externa of right ear     Discharge Instructions      Take the Cortisporin otic 3 times a day for 5 days with food for treatment of your ear infection.  Use over-the-counter Tylenol and ibuprofen as needed for pain or fever.  Place a hot water bottle, or heating pad,  underneath your pillowcase at night to help dilate up your ear and aid in pain relief as well as resolution of the infection.  Return for reevaluation for any new or worsening symptoms.      ED Prescriptions     Medication Sig Dispense Auth. Provider   neomycin-polymyxin-hydrocortisone (CORTISPORIN) 3.5-10000-1 OTIC suspension Place 4 drops into the right ear 3 (three) times daily. 10 mL Margarette Canada, NP      PDMP not reviewed this encounter.   Margarette Canada, NP 09/03/22 1248

## 2022-09-03 NOTE — Discharge Instructions (Signed)
Take the Cortisporin otic 3 times a day for 5 days with food for treatment of your ear infection.  Use over-the-counter Tylenol and ibuprofen as needed for pain or fever.  Place a hot water bottle, or heating pad, underneath your pillowcase at night to help dilate up your ear and aid in pain relief as well as resolution of the infection.  Return for reevaluation for any new or worsening symptoms.

## 2022-09-06 ENCOUNTER — Other Ambulatory Visit: Payer: Self-pay

## 2022-09-06 DIAGNOSIS — H60331 Swimmer's ear, right ear: Secondary | ICD-10-CM | POA: Diagnosis not present

## 2022-09-06 MED ORDER — CIPROFLOXACIN HCL 500 MG PO TABS
500.0000 mg | ORAL_TABLET | Freq: Two times a day (BID) | ORAL | 0 refills | Status: DC
  Filled 2022-09-06: qty 28, 14d supply, fill #0

## 2022-09-06 MED ORDER — PREDNISONE 10 MG PO TABS
ORAL_TABLET | ORAL | 0 refills | Status: AC
  Filled 2022-09-06: qty 21, 6d supply, fill #0

## 2022-09-06 MED ORDER — NEOMYCIN-POLYMYXIN-HC 3.5-10000-1 OT SUSP
4.0000 [drp] | Freq: Two times a day (BID) | OTIC | 6 refills | Status: DC
  Filled 2022-09-06: qty 10, 10d supply, fill #0

## 2022-09-07 ENCOUNTER — Other Ambulatory Visit: Payer: Self-pay

## 2022-09-08 DIAGNOSIS — H60331 Swimmer's ear, right ear: Secondary | ICD-10-CM | POA: Diagnosis not present

## 2022-09-10 DIAGNOSIS — G4733 Obstructive sleep apnea (adult) (pediatric): Secondary | ICD-10-CM | POA: Diagnosis not present

## 2022-09-12 ENCOUNTER — Other Ambulatory Visit: Payer: Self-pay

## 2022-09-23 DIAGNOSIS — G4733 Obstructive sleep apnea (adult) (pediatric): Secondary | ICD-10-CM | POA: Diagnosis not present

## 2022-10-04 ENCOUNTER — Other Ambulatory Visit: Payer: Self-pay

## 2022-10-11 DIAGNOSIS — G4733 Obstructive sleep apnea (adult) (pediatric): Secondary | ICD-10-CM | POA: Diagnosis not present

## 2022-10-24 DIAGNOSIS — G4733 Obstructive sleep apnea (adult) (pediatric): Secondary | ICD-10-CM | POA: Diagnosis not present

## 2022-11-10 DIAGNOSIS — G4733 Obstructive sleep apnea (adult) (pediatric): Secondary | ICD-10-CM | POA: Diagnosis not present

## 2022-12-11 DIAGNOSIS — G4733 Obstructive sleep apnea (adult) (pediatric): Secondary | ICD-10-CM | POA: Diagnosis not present

## 2022-12-22 ENCOUNTER — Other Ambulatory Visit: Payer: Self-pay

## 2022-12-22 MED ORDER — DEXAMETHASONE 0.5 MG/5ML PO ELIX
0.5000 mg | ORAL_SOLUTION | ORAL | 0 refills | Status: DC
  Filled 2022-12-22: qty 237, 24d supply, fill #0

## 2022-12-23 ENCOUNTER — Ambulatory Visit: Payer: 59 | Admitting: Nurse Practitioner

## 2022-12-23 ENCOUNTER — Other Ambulatory Visit: Payer: Self-pay

## 2023-01-03 ENCOUNTER — Other Ambulatory Visit: Payer: Self-pay

## 2023-01-03 ENCOUNTER — Encounter: Payer: Self-pay | Admitting: Nurse Practitioner

## 2023-01-03 ENCOUNTER — Ambulatory Visit (INDEPENDENT_AMBULATORY_CARE_PROVIDER_SITE_OTHER): Payer: 59 | Admitting: Nurse Practitioner

## 2023-01-03 VITALS — BP 120/68 | HR 78 | Temp 98.0°F | Ht 62.0 in | Wt 258.0 lb

## 2023-01-03 DIAGNOSIS — J0110 Acute frontal sinusitis, unspecified: Secondary | ICD-10-CM

## 2023-01-03 MED ORDER — AMOXICILLIN-POT CLAVULANATE 875-125 MG PO TABS
1.0000 | ORAL_TABLET | Freq: Two times a day (BID) | ORAL | 0 refills | Status: DC
  Filled 2023-01-03: qty 20, 10d supply, fill #0

## 2023-01-03 MED ORDER — FLUCONAZOLE 150 MG PO TABS
150.0000 mg | ORAL_TABLET | Freq: Every day | ORAL | 1 refills | Status: DC
  Filled 2023-01-03: qty 2, 2d supply, fill #0

## 2023-01-03 MED ORDER — PREDNISONE 10 MG PO TABS
ORAL_TABLET | ORAL | 0 refills | Status: AC
  Filled 2023-01-03: qty 17, 6d supply, fill #0

## 2023-01-03 NOTE — Progress Notes (Signed)
Established Patient Office Visit  Subjective:  Patient ID: Sarah [name], female    DOB: Apr 09, 1971  Age: 52 y.o. MRN: [id number]  CC:  Chief Complaint  Patient presents with   Headache    Headache x 1 month Left ear obsessively itchy Sinuses never feel clear  Pain 4/10    HPI  Sarah [name] presents for frontal headache and left ear itching from almost 1 month. She states that she feels like her sinus cavities are clogged and never clear.  She rates the headache as 4/10.  She denies any fever, sore throat, chest pain  or SOB.   HPI   Past Medical History:  Diagnosis Date   Acute right lumbar radiculopathy 12/01/2021   (12/01/2021) patient was moving some furniture this past weekend and has developed a right lower extremity pain with radicular components.  Steroid taper pack called in.   Acute right-sided low back pain with right-sided sciatica 12/01/2021   (12/01/2021) pain started after the patient was moving some furniture this past weekend.  Primary pain seems to be that of the lower extremity.  Steroid taper called in.   Acute sinusitis, unspecified 08/09/2021   Acute upper back pain 11/18/2019   Allergy    Annual physical exam 12/09/2016   BMI 40.0-44.9, adult (HCC) 11/17/2021   Cervical facet hypertrophy 12/07/2016   Cervical facet syndrome (HCC) (Left) 12/07/2016   Cervical radiculitis (Left) 12/07/2016   Chicken pox    Chronic hip pain (Left) 03/15/2016   Chronic knee pain (Left) 11/17/2015   Chronic pain    Chronic pain syndrome 10/08/2018   GERD (gastroesophageal reflux disease)    History of kidney stones    Hyperlipidemia    Insomnia 06/14/2019   Kidney stone on left side 01/16/2020   Left lumbar radiculopathy 06/14/2019   Morbid obesity (HCC) 09/20/2015   Musculoskeletal pain 12/07/2016   Myofascial pain syndrome, cervical 12/07/2016   Neurogenic pain 12/07/2016   Personal history of urinary (tract) infections 08/09/2021   PONV (postoperative nausea  and vomiting)    Rhomboid muscle strain, initial encounter 11/18/2019   Shingles    age 84   Sleep apnea    uses cpap machine    Surgical site infection 2022   Symptomatic cholelithiasis 01/16/2020   Trigger point with back pain (Rhomboid area) 11/18/2019   Umbilical hernia 01/16/2020   Ventral hernia 01/16/2020    Past Surgical History:  Procedure Laterality Date   ABDOMINAL HYSTERECTOMY  01/2001   DUB h/o abnormal pap ovaries intact age early 67s abnormal pap   CESAREAN SECTION  12/1987   CESAREAN SECTION  05/1993   CHOLECYSTECTOMY     COLONOSCOPY N/A 07/19/2021   Procedure: COLONOSCOPY;  Surgeon: Jaynie Collins, DO;  Location: [facility] ENDOSCOPY;  Service: Gastroenterology;  Laterality: N/A;   COLONOSCOPY WITH PROPOFOL N/A 01/29/2016   Procedure: COLONOSCOPY WITH PROPOFOL;  Surgeon: Scot Jun, MD;  Location: [facility] ENDOSCOPY;  Service: Endoscopy;  Laterality: N/A;   HERNIA REPAIR  2022   LITHOTRIPSY  2016   UMBILICAL HERNIA REPAIR N/A 11/12/2020   Procedure: HERNIA REPAIR UMBILICAL ADULT, open;  Surgeon: Henrene Dodge, MD;  Location: [facility] ORS;  Service: General;  Laterality: N/A;    Family History  Problem Relation Age of Onset   Breast cancer Mother 50   Cancer Mother        breast   Colon cancer Father 66   Lung cancer Father    Diabetes Father  Hypertension Paternal Grandfather    Breast cancer Maternal Grandmother        30's   Cancer Maternal Grandmother        breast    Cancer Cousin        breast     Social History   Socioeconomic History   Marital status: Widowed    Spouse name: Not on file   Number of children: Not on file   Years of education: Not on file   Highest education level: Associate degree: occupational, Scientist, product/process development, or vocational program  Occupational History   Not on file  Tobacco Use   Smoking status: Former    Current packs/day: 0.00    Average packs/day: 0.5 packs/day for 10.0 years (5.0 ttl pk-yrs)    Types: Cigarettes     Start date: 06/27/2000    Quit date: 06/27/2010    Years since quitting: 12.5   Smokeless tobacco: Never   Tobacco comments:    quit 2015  Vaping Use   Vaping status: Former   Quit date: 02/27/2017   Devices: quit 2018  Substance and Sexual Activity   Alcohol use: Yes    Alcohol/week: 0.0 standard drinks of alcohol    Comment: occasional and very rarely    Drug use: No   Sexual activity: Yes    Partners: Male    Birth control/protection: Surgical    Comment: Husband  Other Topics Concern   Not on file  Social History Narrative   Married but husband of 13 years died 09-03-2019 of throat cancer which spread   Employed as a Psychologist, 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   HS education    2 children (daughter and son)    As of 12/13/19 son expecting a baby   Caffeine- Coffee and tea 4-5 cups daily    Social Determinants of Health   Financial Resource Strain: Low Risk  (01/02/2023)   Overall Financial Resource Strain (CARDIA)    Difficulty of Paying Living Expenses: Not very hard  Food Insecurity: No Food Insecurity (01/02/2023)   Hunger Vital Sign    Worried About Running Out of Food in the Last Year: Never true    Ran Out of Food in the Last Year: Never true  Transportation Needs: No Transportation Needs (01/02/2023)   PRAPARE - Administrator, Civil Service (Medical): No    Lack of Transportation (Non-Medical): No  Physical Activity: Insufficiently Active (01/02/2023)   Exercise Vital Sign    Days of Exercise per Week: 3 days    Minutes of Exercise per Session: 10 min  Stress: No Stress Concern Present (01/02/2023)   [employer] of Occupational Health - Occupational Stress Questionnaire    Feeling of Stress : Only a little  Social Connections: Unknown (01/02/2023)   Social Connection and Isolation Panel [NHANES]    Frequency of Communication with Friends and Family: Three times a week    Frequency of Social Gatherings with Friends and Family: Not on file    Attends Religious Services:  More than 4 times per year    Active Member of Clubs or Organizations: Patient declined    Attends Banker Meetings: Not on file    Marital Status: Widowed  Intimate Partner Violence: Not on file     Outpatient Medications Prior to Visit  Medication Sig Dispense Refill   b complex vitamins tablet Take 1 tablet by mouth daily.     cholecalciferol (VITAMIN D3) 25 MCG (1000 UNIT) tablet Take  1,000 Units by mouth daily. With Vitamin K3     ciprofloxacin (CIPRO) 500 MG tablet Take 1 tablet (500 mg total) by mouth 2 (two) times daily for 14 days. 28 tablet 0   dexamethasone 0.5 MG/5ML elixir Swish 5 mL by mouth twice daily for 3 minutes and spit. 237 mL 0   loratadine (CLARITIN) 10 MG tablet Take 10 mg by mouth daily as needed for allergies.     magnesium oxide (MAG-OX) 400 MG tablet Take 400 mg by mouth daily.     neomycin-polymyxin-hydrocortisone (CORTISPORIN) 3.5-10000-1 OTIC suspension Place 4 drops into affected ears 2 (two) times daily for 10 days 10 mL 6   neomycin-polymyxin-hydrocortisone (CORTISPORIN) 3.5-10000-1 OTIC suspension Place 4 drops into the right ear 3 (three) times daily. 10 mL 0   No facility-administered medications prior to visit.    Allergies  Allergen Reactions   Aspirin     bleeding   Sulfa Antibiotics Hives    ROS Review of Systems Negative unless indicated in HPI.    Objective:    Physical Exam HENT:     Right Ear: Tympanic membrane normal. Tympanic membrane is not erythematous.     Left Ear: Tympanic membrane normal. Tympanic membrane is not erythematous.     Nose:     Right Sinus: Frontal sinus tenderness present. No maxillary sinus tenderness.     Left Sinus: Frontal sinus tenderness present. No maxillary sinus tenderness.     Mouth/Throat:     Mouth: Mucous membranes are moist.     Pharynx: No pharyngeal swelling, oropharyngeal exudate or posterior oropharyngeal erythema.     Tonsils: No tonsillar exudate.  Cardiovascular:      Rate and Rhythm: Normal rate and regular rhythm.  Pulmonary:     Effort: Pulmonary effort is normal.     Breath sounds: Normal breath sounds. No stridor. No wheezing.  Neurological:     General: No focal deficit present.     Mental Status: She is oriented to person, place, and time. Mental status is at baseline.  Psychiatric:        Mood and Affect: Mood normal.        Behavior: Behavior normal.        Thought Content: Thought content normal.        Judgment: Judgment normal.     BP 120/68   Pulse 78   Temp 98 F (36.7 C)   Ht 5\' 2"  (1.575 m)   Wt 258 lb (117 kg)   SpO2 98%   BMI 47.19 kg/m  Wt Readings from Last 3 Encounters:  01/03/23 258 lb (117 kg)  07/29/22 250 lb 9.6 oz (113.7 kg)  07/28/22 248 lb (112.5 kg)     Health Maintenance  Topic Date Due   Zoster Vaccines- Shingrix (1 of 2) Never done   COVID-19 Vaccine (1 - 2023-24 season) Never done   INFLUENZA VACCINE  01/26/2023   MAMMOGRAM  08/17/2024   PAP SMEAR-Modifier  01/15/2026   Colonoscopy  07/19/2026   DTaP/Tdap/Td (3 - Td or Tdap) 01/22/2032   Hepatitis C Screening  Completed   HPV VACCINES  Aged Out    There are no preventive care reminders to display for this patient.  Lab Results  Component Value Date   TSH 2.23 01/21/2022   Lab Results  Component Value Date   WBC 7.0 01/21/2022   HGB 13.3 01/21/2022   HCT 39.8 01/21/2022   MCV 90.4 01/21/2022   PLT 258.0 01/21/2022  Lab Results  Component Value Date   NA 141 01/21/2022   K 4.4 01/21/2022   CO2 28 01/21/2022   GLUCOSE 84 01/21/2022   BUN 16 01/21/2022   CREATININE 0.80 01/21/2022   BILITOT 0.4 01/21/2022   ALKPHOS 74 01/21/2022   AST 16 01/21/2022   ALT 18 01/21/2022   PROT 6.6 01/21/2022   ALBUMIN 4.5 01/21/2022   CALCIUM 9.3 01/21/2022   ANIONGAP 8 03/31/2020   EGFR 102 02/15/2021   GFR 84.35 01/21/2022   Lab Results  Component Value Date   CHOL 218 (H) 01/21/2022   Lab Results  Component Value Date   HDL 66.20  01/21/2022   Lab Results  Component Value Date   LDLCALC 133 (H) 01/21/2022   Lab Results  Component Value Date   TRIG 93.0 01/21/2022   Lab Results  Component Value Date   CHOLHDL 3 01/21/2022   Lab Results  Component Value Date   HGBA1C 4.9 12/19/2017      Assessment & Plan:  Acute frontal sinusitis, recurrence not specified Assessment & Plan: Frontal sinuses tender.  Will treat with Augmentin twice a day for 10 days, prednisone tapering and flonase nasal spray. Advised to increase fluid intake.  If symptoms not improving with refer to ENT.   Other orders -     Amoxicillin-Pot Clavulanate; Take 1 tablet by mouth 2 (two) times daily.  Dispense: 20 tablet; Refill: 0 -     predniSONE; Take 4 tablets (40 mg total) by mouth daily for 2 days, THEN 3 tablets (30 mg total) daily for 2 days, THEN 2 tablets (20 mg total) daily for 1 day, THEN 1 tablet (10 mg total) daily for 1 day.  Dispense: 17 tablet; Refill: 0 -     Fluconazole; Take 1 tablet (150 mg total) by mouth daily.  Dispense: 2 tablet; Refill: 1    Follow-up: No follow-ups on file.   Kara Dies, NP

## 2023-01-08 DIAGNOSIS — J0110 Acute frontal sinusitis, unspecified: Secondary | ICD-10-CM

## 2023-01-08 HISTORY — DX: Acute frontal sinusitis, unspecified: J01.10

## 2023-01-08 NOTE — Assessment & Plan Note (Signed)
Frontal sinuses tender.  Will treat with Augmentin twice a day for 10 days, prednisone tapering and flonase nasal spray. Advised to increase fluid intake.  If symptoms not improving with refer to ENT.

## 2023-01-10 DIAGNOSIS — G4733 Obstructive sleep apnea (adult) (pediatric): Secondary | ICD-10-CM | POA: Diagnosis not present

## 2023-01-16 ENCOUNTER — Encounter: Payer: Self-pay | Admitting: Family Medicine

## 2023-01-16 ENCOUNTER — Telehealth: Payer: Self-pay | Admitting: Family Medicine

## 2023-01-16 NOTE — Telephone Encounter (Signed)
Patient walled into office for a lab appointment, She was told by her provider she did not need an appointment. No available labs, please re-do lab and send to [facility] lab. Patient wants these done on Thursday at hospital.

## 2023-01-16 NOTE — Telephone Encounter (Signed)
This has been fixed and sent pt Mychart message.

## 2023-01-16 NOTE — Addendum Note (Signed)
Addended by: Warden Fillers on: 01/16/2023 01:47 PM   Modules accepted: Orders

## 2023-01-17 ENCOUNTER — Other Ambulatory Visit
Admission: RE | Admit: 2023-01-17 | Discharge: 2023-01-17 | Disposition: A | Discharge status: Home / Self Care | Payer: 59 | Source: Ambulatory Visit | Attending: Family Medicine | Admitting: Family Medicine

## 2023-01-17 ENCOUNTER — Encounter: Payer: Self-pay | Admitting: Family Medicine

## 2023-01-17 DIAGNOSIS — Z6841 Body Mass Index (BMI) 40.0 and over, adult: Secondary | ICD-10-CM | POA: Insufficient documentation

## 2023-01-17 LAB — COMPREHENSIVE METABOLIC PANEL
ALT: 20 U/L (ref 0–44)
AST: 17 U/L (ref 15–41)
Albumin: 3.7 g/dL (ref 3.5–5.0)
Alkaline Phosphatase: 60 U/L (ref 38–126)
Anion gap: 6 (ref 5–15)
BUN: 21 mg/dL — ABNORMAL HIGH (ref 6–20)
CO2: 26 mmol/L (ref 22–32)
Calcium: 8.7 mg/dL — ABNORMAL LOW (ref 8.9–10.3)
Chloride: 106 mmol/L (ref 98–111)
Creatinine, Ser: 0.66 mg/dL (ref 0.44–1.00)
GFR, Estimated: >60 mL/min (ref >60)
Glucose, Bld: 112 mg/dL — ABNORMAL HIGH (ref 70–99)
Potassium: 4.6 mmol/L (ref 3.5–5.1)
Sodium: 138 mmol/L (ref 135–145)
Total Bilirubin: 0.6 mg/dL (ref 0.3–1.2)
Total Protein: 6.5 g/dL (ref 6.5–8.1)

## 2023-01-17 LAB — LIPID PANEL
Cholesterol: 205 mg/dL — ABNORMAL HIGH (ref 0–200)
HDL: 64 mg/dL
LDL Cholesterol: 130 mg/dL — ABNORMAL HIGH (ref 0–99)
Total CHOL/HDL Ratio: 3.2 ratio
Triglycerides: 56 mg/dL
VLDL: 11 mg/dL (ref 0–40)

## 2023-01-17 LAB — CBC WITH DIFFERENTIAL/PLATELET
Abs Immature Granulocytes: 0.04 10*3/uL (ref 0.00–0.07)
Basophils Absolute: 0 10*3/uL (ref 0.0–0.1)
Basophils Relative: 1 %
Eosinophils Absolute: 0.2 10*3/uL (ref 0.0–0.5)
Eosinophils Relative: 4 %
HCT: 39.2 % (ref 36.0–46.0)
Hemoglobin: 13 g/dL (ref 12.0–15.0)
Immature Granulocytes: 1 %
Lymphocytes Relative: 35 %
Lymphs Abs: 2.4 10*3/uL (ref 0.7–4.0)
MCH: 29.4 pg (ref 26.0–34.0)
MCHC: 33.2 g/dL (ref 30.0–36.0)
MCV: 88.7 fL (ref 80.0–100.0)
Monocytes Absolute: 0.4 10*3/uL (ref 0.1–1.0)
Monocytes Relative: 6 %
Neutro Abs: 3.6 10*3/uL (ref 1.7–7.7)
Neutrophils Relative %: 53 %
Platelets: 220 10*3/uL (ref 150–400)
RBC: 4.42 MIL/uL (ref 3.87–5.11)
RDW: 14.2 % (ref 11.5–15.5)
WBC: 6.6 10*3/uL (ref 4.0–10.5)
nRBC: 0 % (ref 0.0–0.2)

## 2023-01-17 LAB — VITAMIN D 25 HYDROXY (VIT D DEFICIENCY, FRACTURES): Vit D, 25-Hydroxy: 76.29 ng/mL (ref 30–100)

## 2023-01-17 LAB — HEMOGLOBIN A1C
Hgb A1c MFr Bld: 5.3 % (ref 4.8–5.6)
Mean Plasma Glucose: 105.41 mg/dL

## 2023-01-17 LAB — VITAMIN B12: Vitamin B-12: 341 pg/mL (ref 180–914)

## 2023-01-23 ENCOUNTER — Encounter: Payer: Self-pay | Admitting: Family Medicine

## 2023-01-23 ENCOUNTER — Ambulatory Visit (INDEPENDENT_AMBULATORY_CARE_PROVIDER_SITE_OTHER): Payer: 59 | Admitting: Family Medicine

## 2023-01-23 ENCOUNTER — Encounter: Payer: 59 | Admitting: Family Medicine

## 2023-01-23 VITALS — BP 102/60 | HR 74 | Temp 97.9°F | Resp 16 | Ht 62.0 in | Wt 258.5 lb

## 2023-01-23 DIAGNOSIS — E785 Hyperlipidemia, unspecified: Secondary | ICD-10-CM

## 2023-01-23 DIAGNOSIS — Z6841 Body Mass Index (BMI) 40.0 and over, adult: Secondary | ICD-10-CM

## 2023-01-23 DIAGNOSIS — Z0000 Encounter for general adult medical examination without abnormal findings: Secondary | ICD-10-CM

## 2023-01-23 NOTE — Patient Instructions (Signed)
It was a pleasure meeting you today. Thank you for allowing me to take part in your health care.  Our goals for today as we discussed include:  Look at [facility] website to see if this may be an option for you with your weight loss. [facility] [address] [place] [phone]   PAP due 2027  Recommend Shingles vaccine.  This is a 2 dose series and can be given at your local pharmacy.  Please talk to your pharmacist about this.   If you have any questions or concerns, please do not hesitate to call the office at [phone].  I look forward to our next visit and until then take care and stay safe.  Regards,   Dana Allan, MD   [facility]

## 2023-01-23 NOTE — Progress Notes (Signed)
   SUBJECTIVE:   Chief Complaint  Patient presents with   Annual Exam   HPI Patient presents to clinic to annual physical No acute concerns.  Not currently on any medications.  Doing well.  Denies SI/HI Continues to have issues with weight management.  Reviewed recent blood work  Review of Systems - General ROS: negative    PERTINENT PMH / PSH: OSA on CPAP GERD Obesity class III  OBJECTIVE:  BP 102/60   Pulse 74   Temp 97.9 F (36.6 C)   Resp 16   Ht 5\' 2"  (1.575 m)   Wt 258 lb 8 oz (117.3 kg)   SpO2 99%   BMI 47.28 kg/m    Physical Exam Vitals reviewed.  Constitutional:      General: She is not in acute distress.    Appearance: She is not ill-appearing.  HENT:     Head: Normocephalic.     Nose: Nose normal.     Mouth/Throat:     Mouth: Mucous membranes are moist.  Eyes:     Conjunctiva/sclera: Conjunctivae normal.  Cardiovascular:     Rate and Rhythm: Normal rate and regular rhythm.     Heart sounds: Normal heart sounds.  Pulmonary:     Effort: Pulmonary effort is normal.     Breath sounds: Normal breath sounds.  Abdominal:     General: Abdomen is flat. Bowel sounds are normal.     Palpations: Abdomen is soft.  Musculoskeletal:        General: Normal range of motion.     Cervical back: Normal range of motion.     Right lower leg: No edema.     Left lower leg: No edema.  Skin:    General: Skin is warm.     Capillary Refill: Capillary refill takes less than 2 seconds.  Neurological:     Mental Status: She is alert and oriented to person, place, and time. Mental status is at baseline.  Psychiatric:        Mood and Affect: Mood normal.        Behavior: Behavior normal.        Thought Content: Thought content normal.        Judgment: Judgment normal.     ASSESSMENT/PLAN:  Annual physical exam Assessment & Plan: Mammogram up-to-date Recommend regular self breast exams PAP due 2027  Colonoscopy up-to-date Tetanus up-to-date Hepatitis C  screening Recommend shingles vaccine PHQ-9/GAD screening not documented. Denies SI/HI BP well controlled.     Class 3 severe obesity due to excess calories without serious comorbidity with body mass index (BMI) of 40.0 to 44.9 in adult [facility]) Assessment & Plan: BMI remains unchanged. Recommend healthy weight and wellness clinic in [facility] Portion control and increased activity   Hyperlipidemia, unspecified hyperlipidemia type Assessment & Plan: LDL 130 The 10-year ASCVD risk score (Arnett DK, et al., 2019) is: 1%  Low risk No indication for statin therapy     PDMP reviewed  Return if symptoms worsen or fail to improve.  Dana Allan, MD

## 2023-02-05 ENCOUNTER — Encounter: Payer: Self-pay | Admitting: Family Medicine

## 2023-02-05 NOTE — Assessment & Plan Note (Addendum)
Mammogram up-to-date Recommend regular self breast exams PAP due 2027  Colonoscopy up-to-date Tetanus up-to-date Hepatitis C screening Recommend shingles vaccine PHQ-9/GAD screening not documented. Denies SI/HI BP well controlled.

## 2023-02-05 NOTE — Assessment & Plan Note (Signed)
LDL 130 The 10-year ASCVD risk score (Arnett DK, et al., 2019) is: 1%  Low risk No indication for statin therapy

## 2023-02-05 NOTE — Assessment & Plan Note (Signed)
BMI remains unchanged. Recommend healthy weight and wellness clinic in [facility] Portion control and increased activity

## 2023-02-10 DIAGNOSIS — G4733 Obstructive sleep apnea (adult) (pediatric): Secondary | ICD-10-CM | POA: Diagnosis not present

## 2023-03-10 DIAGNOSIS — G4733 Obstructive sleep apnea (adult) (pediatric): Secondary | ICD-10-CM | POA: Diagnosis not present

## 2023-03-13 DIAGNOSIS — G4733 Obstructive sleep apnea (adult) (pediatric): Secondary | ICD-10-CM | POA: Diagnosis not present

## 2023-04-03 NOTE — Progress Notes (Unsigned)
Celso Amy, PA-C [address]  [address]  [place], [place] [address]  Main: [phone]  Fax: [phone]   Gastroenterology Consultation  Referring Provider:     Dana Allan, MD Primary Care Physician:  Dana Allan, MD Primary Gastroenterologist:  Celso Amy, PA-C / Dr. Lannette Donath   Reason for Consultation:     Stomach pain        HPI:   Sarah [name] is a 52 y.o. y/o female referred for consultation & management  by Dana Allan, MD.    Here to intermittent lower abdominal pain.  Has had intermittent episodes of mid lower abdominal pain which comes and goes for many years.  She had a flareup last month of lower abdominal cramping.  In the past few weeks she has not had any lower abdominal pain.  She has history of diverticulosis but has never been diagnosed with diverticulitis.  She has avoids seeds, nuts, and popcorn for many years due to fear of having diverticulitis.  She recently started OTC probiotic with some benefit.  She reports occasional episode of diarrhea alternating with constipation.  She has increased frequent loose bowel movements when he she is having a flareup of lower abdominal cramping.  She denies rectal bleeding, unintentional weight loss, or alarm symptoms.  No previous EGD.  H. pylori serology negative in 2020.   Cholecystectomy in 2023 for gallstones.  Most recent labs 12/2022 showed normal CBC, CMP, and B12.  Normal hemoglobin 13.0.  Normal LFTs.  Last abdominal pelvic CT with contrast 09/2020, to evaluate abdominal pain with nausea/vomiting, showed cholelithiasis, diverticulosis, left kidney stone, otherwise unremarkable.  No diverticulitis or appendicitis.  Previous hysterectomy.  Previous patient of [facility].  Previous history of adenomatous colon polyps.  Also has family history of her father who had colon cancer age 39.  Last colonoscopy 06/2021 by Dr. Timothy Lasso: Excellent prep, 2 small 1 mm to 2 mm hyperplastic polyps removed  from sigmoid colon, otherwise normal.  Pandiverticulosis.  Small internal hemorrhoids.  5-year repeat will be due 06/2026.  Colonoscopy 01/2016 showed 2 small (1 tubular adenoma and 1 hyperplastic) polyps removed from transverse colon.  Excellent prep.  Diverticulosis and small internal hemorrhoids.  Past Medical History:  Diagnosis Date   Acute right lumbar radiculopathy 12/01/2021   (12/01/2021) patient was moving some furniture this past weekend and has developed a right lower extremity pain with radicular components.  Steroid taper pack called in.   Acute right-sided low back pain with right-sided sciatica 12/01/2021   (12/01/2021) pain started after the patient was moving some furniture this past weekend.  Primary pain seems to be that of the lower extremity.  Steroid taper called in.   Acute sinusitis, unspecified 08/09/2021   Acute upper back pain 11/18/2019   Allergy    Annual physical exam 12/09/2016   BMI 40.0-44.9, adult (HCC) 11/17/2021   Cervical facet hypertrophy 12/07/2016   Cervical facet syndrome (HCC) (Left) 12/07/2016   Cervical radiculitis (Left) 12/07/2016   Chicken pox    Chronic hip pain (Left) 03/15/2016   Chronic knee pain (Left) 11/17/2015   Chronic pain    Chronic pain syndrome 10/08/2018   GERD (gastroesophageal reflux disease)    History of kidney stones    Hyperlipidemia    Insomnia 06/14/2019   Kidney stone on left side 01/16/2020   Left lumbar radiculopathy 06/14/2019   Morbid obesity (HCC) 09/20/2015   Musculoskeletal pain 12/07/2016   Myofascial pain syndrome, cervical 12/07/2016  Neurogenic pain 12/07/2016   Personal history of urinary (tract) infections 08/09/2021   PONV (postoperative nausea and vomiting)    Rhomboid muscle strain, initial encounter 11/18/2019   Shingles    age 75   Sleep apnea    uses cpap machine    Surgical site infection 2022   Symptomatic cholelithiasis 01/16/2020   Trigger point with back pain (Rhomboid area) 11/18/2019    Umbilical hernia 01/16/2020   Ventral hernia 01/16/2020    Past Surgical History:  Procedure Laterality Date   ABDOMINAL HYSTERECTOMY  01/2001   DUB h/o abnormal pap ovaries intact age early 51s abnormal pap   CESAREAN SECTION  12/1987   CESAREAN SECTION  05/1993   CHOLECYSTECTOMY     COLONOSCOPY N/A 07/19/2021   Procedure: COLONOSCOPY;  Surgeon: Jaynie Collins, DO;  Location: [facility] ENDOSCOPY;  Service: Gastroenterology;  Laterality: N/A;   COLONOSCOPY WITH PROPOFOL N/A 01/29/2016   Procedure: COLONOSCOPY WITH PROPOFOL;  Surgeon: Scot Jun, MD;  Location: [facility] ENDOSCOPY;  Service: Endoscopy;  Laterality: N/A;   HERNIA REPAIR  2022   LITHOTRIPSY  2016   UMBILICAL HERNIA REPAIR N/A 11/12/2020   Procedure: HERNIA REPAIR UMBILICAL ADULT, open;  Surgeon: Henrene Dodge, MD;  Location: [facility] ORS;  Service: General;  Laterality: N/A;    Prior to Admission medications   Medication Sig Start Date End Date Taking? Authorizing Provider  b complex vitamins tablet Take 1 tablet by mouth daily.    [provider]  cholecalciferol (VITAMIN D3) 25 MCG (1000 UNIT) tablet Take 1,000 Units by mouth daily. With Vitamin K3    [provider]  magnesium oxide (MAG-OX) 400 MG tablet Take 400 mg by mouth daily.    [provider]    Family History  Problem Relation Age of Onset   Breast cancer Mother 52   Cancer Mother        breast   Colon cancer Father 54   Lung cancer Father    Diabetes Father    Hypertension Paternal Grandfather    Breast cancer Maternal Grandmother        30's   Cancer Maternal Grandmother        breast    Cancer Cousin        breast      Social History   Tobacco Use   Smoking status: Former    Current packs/day: 0.00    Average packs/day: 0.5 packs/day for 10.0 years (5.0 ttl pk-yrs)    Types: Cigarettes    Start date: 06/27/2000    Quit date: 06/27/2010    Years since quitting: 12.7   Smokeless tobacco: Never   Tobacco  comments:    quit 2015  Vaping Use   Vaping status: Former   Quit date: 02/27/2017   Devices: quit 2018  Substance Use Topics   Alcohol use: Yes    Alcohol/week: 0.0 standard drinks of alcohol    Comment: occasional and very rarely    Drug use: No    Allergies as of 04/04/2023 - Review Complete 04/04/2023  Allergen Reaction Noted   Aspirin  11/22/2012   Sulfa antibiotics Hives 11/22/2012    Review of Systems:    All systems reviewed and negative except where noted in HPI.   Physical Exam:  BP 111/77   Pulse 85   Temp 97.7 F (36.5 C)   Ht 5\' 2"  (1.575 m)   Wt 254 lb 9.6 oz (115.5 kg)   BMI 46.57 kg/m  No  LMP recorded. Patient has had a hysterectomy.  General:   Alert,  Well-developed, well-nourished, pleasant and cooperative in NAD Lungs:  Respirations even and unlabored.  Clear throughout to auscultation.   No wheezes, crackles, or rhonchi. No acute distress. Heart:  Regular rate and rhythm; no murmurs, clicks, rubs, or gallops. Abdomen:  Normal bowel sounds.  No bruits.  Soft, and non-distended without masses, hepatosplenomegaly or hernias noted.  No Tenderness.  No current upper or lower abdominal tenderness at all today.  No guarding or rebound tenderness.    Neurologic:  Alert and oriented x3;  grossly normal neurologically. Psych:  Alert and cooperative. Normal mood and affect.  Imaging Studies: No results found.  Assessment and Plan:   Sarah [name] is a 52 y.o. y/o female has been referred for:   1.  Chronic and intermittent lower abdominal cramping; most suspicious for irritable bowel syndrome mixed with diarrhea alternating with constipation.  Symptoms currently improved.  We discussed treatment for IBS at length.  Rx dicyclomine 10 Mg 1 tablet 3 times daily as needed for abdominal cramping.  Continue OTC probiotic.  Low FODMAP diet.  Start Benefiber 1 or 2 tablespoons in agreement once daily.  2.  Diverticulosis  Patient education given discussing the  difference between diverticulosis and diverticulitis.  Advised patient if she has severe constant localized abdominal pain, then let us know and we can schedule repeat CT.  Reassurance that her previous CT did not show diverticulitis.  Start Benefiber daily to help with bowel irregularities.  3.  Family history of colon cancer; personal history of 1 small adenomatous colon polyp.  5-year repeat colonoscopy will be due 06/2026.  Colon cancer screening guidelines discussed.  Follow up in 3 months with TG or as needed based on GI symptoms.  Celso Amy, PA-C

## 2023-04-04 ENCOUNTER — Other Ambulatory Visit: Payer: Self-pay

## 2023-04-04 ENCOUNTER — Ambulatory Visit: Payer: 59 | Admitting: Physician Assistant

## 2023-04-04 ENCOUNTER — Encounter: Payer: Self-pay | Admitting: Physician Assistant

## 2023-04-04 VITALS — BP 111/77 | HR 85 | Temp 97.7°F | Ht 62.0 in | Wt 254.6 lb

## 2023-04-04 DIAGNOSIS — K5790 Diverticulosis of intestine, part unspecified, without perforation or abscess without bleeding: Secondary | ICD-10-CM | POA: Diagnosis not present

## 2023-04-04 DIAGNOSIS — K582 Mixed irritable bowel syndrome: Secondary | ICD-10-CM

## 2023-04-04 MED ORDER — DICYCLOMINE HCL 10 MG PO CAPS
10.0000 mg | ORAL_CAPSULE | Freq: Three times a day (TID) | ORAL | 2 refills | Status: DC
Start: 2023-04-04 — End: 2024-05-10
  Filled 2023-04-04: qty 90, 30d supply, fill #0

## 2023-04-09 DIAGNOSIS — G4733 Obstructive sleep apnea (adult) (pediatric): Secondary | ICD-10-CM | POA: Diagnosis not present

## 2023-04-12 DIAGNOSIS — G4733 Obstructive sleep apnea (adult) (pediatric): Secondary | ICD-10-CM | POA: Diagnosis not present

## 2023-04-20 ENCOUNTER — Ambulatory Visit: Payer: 59 | Admitting: Adult Health

## 2023-04-20 ENCOUNTER — Encounter: Payer: Self-pay | Admitting: Adult Health

## 2023-04-20 VITALS — BP 110/78 | HR 78 | Temp 97.5°F | Ht 62.0 in | Wt 254.0 lb

## 2023-04-20 DIAGNOSIS — G4733 Obstructive sleep apnea (adult) (pediatric): Secondary | ICD-10-CM | POA: Diagnosis not present

## 2023-04-20 NOTE — Patient Instructions (Signed)
Continue on CPAP at bedtime goal is to wear all night long for at least 6 or more hours Work on healthy weight loss Do not drive if sleepy Follow-up in 1 year and as needed

## 2023-04-20 NOTE — Progress Notes (Signed)
@Patient  ID: Sarah [name], female    DOB: 02/21/1971, 52 y.o.   MRN: [id number]  Chief Complaint  Patient presents with   Follow-up    Referring provider: Dana Allan, MD  HPI: 52 year old female followed for moderate obstructive sleep apnea on CPAP  TEST/EVENTS :  HST 10/10/19 showed moderate OSA, AHI 19.3/hour with SpO2 low 71%.   04/20/2023 Follow up : OSA Patient presents for a 77-month follow-up.  Patient has underlying sleep apnea is on nocturnal CPAP.  Overall patient says she is doing well on CPAP.  She wears her CPAP every single night.  Feels that she benefits from CPAP.  CPAP download shows excellent compliance with 100% usage.  Daily average usage at 8 hours.  Patient is on auto CPAP 5 to 15 cm H2O.  AHI 2.5/hour.  Daily average pressure at 14.3 cm H2O.  She uses a DreamWear nasal mask.  Works as a pain clinic at [employer]  Allergies  Allergen Reactions   Aspirin     bleeding   Sulfa Antibiotics Hives    Immunization History  Administered Date(s) Administered   Influenza,inj,Quad PF,6+ Mos 04/11/2022   Influenza-Unspecified 04/08/2015, 04/03/2018, 04/09/2019, 03/27/2020, 04/08/2021, 04/04/2022   Tdap 12/07/2011, 01/21/2022    Past Medical History:  Diagnosis Date   Acute right lumbar radiculopathy 12/01/2021   (12/01/2021) patient was moving some furniture this past weekend and has developed a right lower extremity pain with radicular components.  Steroid taper pack called in.   Acute right-sided low back pain with right-sided sciatica 12/01/2021   (12/01/2021) pain started after the patient was moving some furniture this past weekend.  Primary pain seems to be that of the lower extremity.  Steroid taper called in.   Acute sinusitis, unspecified 08/09/2021   Acute upper back pain 11/18/2019   Allergy    Annual physical exam 12/09/2016   BMI 40.0-44.9, adult (HCC) 11/17/2021   Cervical facet hypertrophy 12/07/2016   Cervical facet syndrome (HCC) (Left)  12/07/2016   Cervical radiculitis (Left) 12/07/2016   Chicken pox    Chronic hip pain (Left) 03/15/2016   Chronic knee pain (Left) 11/17/2015   Chronic pain    Chronic pain syndrome 10/08/2018   GERD (gastroesophageal reflux disease)    History of kidney stones    Hyperlipidemia    Insomnia 06/14/2019   Kidney stone on left side 01/16/2020   Left lumbar radiculopathy 06/14/2019   Morbid obesity (HCC) 09/20/2015   Musculoskeletal pain 12/07/2016   Myofascial pain syndrome, cervical 12/07/2016   Neurogenic pain 12/07/2016   Personal history of urinary (tract) infections 08/09/2021   PONV (postoperative nausea and vomiting)    Rhomboid muscle strain, initial encounter 11/18/2019   Shingles    age 59   Sleep apnea    uses cpap machine    Surgical site infection 2022   Symptomatic cholelithiasis 01/16/2020   Trigger point with back pain (Rhomboid area) 11/18/2019   Umbilical hernia 01/16/2020   Ventral hernia 01/16/2020    Tobacco History: Social History   Tobacco Use  Smoking Status Former   Current packs/day: 0.00   Average packs/day: 0.5 packs/day for 10.0 years (5.0 ttl pk-yrs)   Types: Cigarettes   Start date: 06/27/2000   Quit date: 06/27/2010   Years since quitting: 12.8  Smokeless Tobacco Never  Tobacco Comments   quit 2015   Counseling given: Not Answered Tobacco comments: quit 2015   Outpatient Medications Prior to Visit  Medication Sig Dispense Refill  b complex vitamins tablet Take 1 tablet by mouth daily.     cholecalciferol (VITAMIN D3) 25 MCG (1000 UNIT) tablet Take 1,000 Units by mouth daily. With Vitamin K3     dicyclomine (BENTYL) 10 MG capsule Take 1 capsule (10 mg total) by mouth 3 (three) times daily before meals. 90 capsule 2   magnesium oxide (MAG-OX) 400 MG tablet Take 400 mg by mouth daily.     Probiotic Product (SOLUBLE FIBER/PROBIOTICS PO) Take by mouth.     No facility-administered medications prior to visit.     Review of Systems:    Constitutional:   No  weight loss, night sweats,  Fevers, chills, fatigue, or  lassitude.  HEENT:   No headaches,  Difficulty swallowing,  Tooth/dental problems, or  Sore throat,                No sneezing, itching, ear ache, nasal congestion, post nasal drip,   CV:  No chest pain,  Orthopnea, PND, swelling in lower extremities, anasarca, dizziness, palpitations, syncope.   GI  No heartburn, indigestion, abdominal pain, nausea, vomiting, diarrhea, change in bowel habits, loss of appetite, bloody stools.   Resp: No shortness of breath with exertion or at rest.  No excess mucus, no productive cough,  No non-productive cough,  No coughing up of blood.  No change in color of mucus.  No wheezing.  No chest wall deformity  Skin: no rash or lesions.  GU: no dysuria, change in color of urine, no urgency or frequency.  No flank pain, no hematuria   MS:  No joint pain or swelling.  No decreased range of motion.  No back pain.    Physical Exam  BP 110/78 (BP Location: Right Arm, Cuff Size: Large)   Pulse 78   Temp (!) 97.5 F (36.4 C)   Ht 5\' 2"  (1.575 m)   Wt 254 lb (115.2 kg)   SpO2 100%   BMI 46.46 kg/m   GEN: A/Ox3; pleasant , NAD, well nourished    HEENT:  [place]/AT,  EACs-clear, TMs-wnl, NOSE-clear, THROAT-clear, no lesions, no postnasal drip or exudate noted.   NECK:  Supple w/ fair ROM; no JVD; normal carotid impulses w/o bruits; no thyromegaly or nodules palpated; no lymphadenopathy.    RESP  Clear  P & A; w/o, wheezes/ rales/ or rhonchi. no accessory muscle use, no dullness to percussion  CARD:  RRR, no m/r/g, no peripheral edema, pulses intact, no cyanosis or clubbing.  GI:   Soft & nt; nml bowel sounds; no organomegaly or masses detected.   Musco: Warm bil, no deformities or joint swelling noted.   Neuro: alert, no focal deficits noted.    Skin: Warm, no lesions or rashes    Lab Results:  CBC    ProBNP No results found for: "PROBNP"  Imaging: No results  found.  Administration History     None           No data to display          No results found for: "NITRICOXIDE"      Assessment & Plan:   OSA on CPAP Excellent control compliance on nocturnal CPAP  Plan  Patient Instructions  Continue on CPAP at bedtime goal is to wear all night long for at least 6 or more hours Work on healthy weight loss Do not drive if sleepy Follow-up in 1 year and as needed      Rubye Oaks, NP 04/20/2023

## 2023-04-20 NOTE — Assessment & Plan Note (Signed)
Excellent control compliance on nocturnal CPAP  Plan  Patient Instructions  Continue on CPAP at bedtime goal is to wear all night long for at least 6 or more hours Work on healthy weight loss Do not drive if sleepy Follow-up in 1 year and as needed

## 2023-05-03 ENCOUNTER — Other Ambulatory Visit: Payer: Self-pay

## 2023-05-03 MED ORDER — SHINGRIX 50 MCG/0.5ML IM SUSR
INTRAMUSCULAR | 0 refills | Status: DC
  Filled 2023-05-03: qty 0.5, 1d supply, fill #0

## 2023-05-04 ENCOUNTER — Other Ambulatory Visit: Payer: Self-pay

## 2023-05-05 ENCOUNTER — Other Ambulatory Visit: Payer: Self-pay

## 2023-05-08 DIAGNOSIS — D2261 Melanocytic nevi of right upper limb, including shoulder: Secondary | ICD-10-CM | POA: Diagnosis not present

## 2023-05-08 DIAGNOSIS — D2272 Melanocytic nevi of left lower limb, including hip: Secondary | ICD-10-CM | POA: Diagnosis not present

## 2023-05-08 DIAGNOSIS — D225 Melanocytic nevi of trunk: Secondary | ICD-10-CM | POA: Diagnosis not present

## 2023-05-08 DIAGNOSIS — L814 Other melanin hyperpigmentation: Secondary | ICD-10-CM | POA: Diagnosis not present

## 2023-05-08 DIAGNOSIS — L821 Other seborrheic keratosis: Secondary | ICD-10-CM | POA: Diagnosis not present

## 2023-05-08 DIAGNOSIS — D2262 Melanocytic nevi of left upper limb, including shoulder: Secondary | ICD-10-CM | POA: Diagnosis not present

## 2023-05-08 DIAGNOSIS — D2271 Melanocytic nevi of right lower limb, including hip: Secondary | ICD-10-CM | POA: Diagnosis not present

## 2023-05-10 DIAGNOSIS — G4733 Obstructive sleep apnea (adult) (pediatric): Secondary | ICD-10-CM | POA: Diagnosis not present

## 2023-05-13 DIAGNOSIS — G4733 Obstructive sleep apnea (adult) (pediatric): Secondary | ICD-10-CM | POA: Diagnosis not present

## 2023-08-07 ENCOUNTER — Other Ambulatory Visit: Payer: Self-pay

## 2023-08-10 ENCOUNTER — Other Ambulatory Visit: Payer: Self-pay

## 2023-08-10 MED ORDER — ZOSTER VAC RECOMB ADJUVANTED 50 MCG/0.5ML IM SUSR
0.5000 mL | Freq: Once | INTRAMUSCULAR | 0 refills | Status: AC
  Filled 2023-08-10: qty 0.5, 1d supply, fill #0

## 2023-09-18 ENCOUNTER — Other Ambulatory Visit: Payer: Self-pay | Admitting: Family Medicine

## 2023-09-18 DIAGNOSIS — Z1231 Encounter for screening mammogram for malignant neoplasm of breast: Secondary | ICD-10-CM

## 2023-09-19 ENCOUNTER — Ambulatory Visit
Admission: RE | Admit: 2023-09-19 | Discharge: 2023-09-19 | Disposition: A | Discharge status: Home / Self Care | Source: Ambulatory Visit | Attending: Family Medicine | Admitting: Family Medicine

## 2023-09-19 DIAGNOSIS — Z1231 Encounter for screening mammogram for malignant neoplasm of breast: Secondary | ICD-10-CM | POA: Diagnosis not present

## 2023-10-08 NOTE — Progress Notes (Unsigned)
 Cardiology Office Note  Date:  10/09/2023   ID:  Sarah [name], DOB 10-23-1970, MRN [id number]  PCP:  Valli Gaw, MD   Chief Complaint  Patient presents with   New Patient (Initial Visit)    Patient c/o PVC's & SVT with chest soreness/discomfort; symptoms of SVT lasted 10 minutes.     HPI:  Sarah [name] is a 53 year old woman with past medical history of Sleep apnea SVT referred by Hollis Lurie for consultation of her SVT  She reports prior history of paroxysmal tachycardia, SVT Previously seen by cardiology, Dr. Cara Chancellor in 2004/2006 Echocardiogram at that time, results not available  Has been doing well for many years Recurrent paroxysmal tachycardia episode 3/25 Lasting 10 min, very symptomatic Tried ice on face, valsalva, eventually he resolved  Was home at the time Lives alone Was not dizzy/no orthostasis, no diaphoresis  Having PVCs at times, more notable at rest  Very rare short episodes of SVT, nothing like episode above Currently not on medications for arrhythmia Denies any stressors or triggers for arrhythmia event above  Otherwise active at baseline Weight trending up over the past several years  EKG personally reviewed by myself on todays visit EKG Interpretation Date/Time:  Monday October 09 2023 08:52:12 EDT Ventricular Rate:  84 PR Interval:  148 QRS Duration:  78 QT Interval:  344 QTC Calculation: 406 R Axis:   6  Text Interpretation: Normal sinus rhythm Normal ECG When compared with ECG of 14-Jun-2016 06:47, Nonspecific T wave abnormality, improved in Anterior leads Confirmed by Belva Boyden [phone]) on 10/09/2023 8:58:31 AM    PMH:   has a past medical history of Acute right lumbar radiculopathy (12/01/2021), Acute right-sided low back pain with right-sided sciatica (12/01/2021), Acute sinusitis, unspecified (08/09/2021), Acute upper back pain (11/18/2019), Allergy, Annual physical exam (12/09/2016), BMI 40.0-44.9, adult (HCC) (11/17/2021),  Cervical facet hypertrophy (12/07/2016), Cervical facet syndrome (HCC) (Left) (12/07/2016), Cervical radiculitis (Left) (12/07/2016), Chicken pox, Chronic hip pain (Left) (03/15/2016), Chronic knee pain (Left) (11/17/2015), Chronic pain, Chronic pain syndrome (10/08/2018), GERD (gastroesophageal reflux disease), History of kidney stones, Hyperlipidemia, Insomnia (06/14/2019), Kidney stone on left side (01/16/2020), Left lumbar radiculopathy (06/14/2019), Morbid obesity (HCC) (09/20/2015), Musculoskeletal pain (12/07/2016), Myofascial pain syndrome, cervical (12/07/2016), Neurogenic pain (12/07/2016), Personal history of urinary (tract) infections (08/09/2021), PONV (postoperative nausea and vomiting), Rhomboid muscle strain, initial encounter (11/18/2019), Shingles, Sleep apnea, Surgical site infection (2022), Symptomatic cholelithiasis (01/16/2020), Trigger point with back pain (Rhomboid area) (11/18/2019), Umbilical hernia (01/16/2020), and Ventral hernia (01/16/2020).  PSH:    Past Surgical History:  Procedure Laterality Date   ABDOMINAL HYSTERECTOMY  01/2001   DUB h/o abnormal pap ovaries intact age early 69s abnormal pap   CESAREAN SECTION  12/1987   CESAREAN SECTION  05/1993   CHOLECYSTECTOMY     COLONOSCOPY N/A 07/19/2021   Procedure: COLONOSCOPY;  Surgeon: Quintin Buckle, DO;  Location[facility] END[facility]COPY;  Service: Gastroenterology;  Laterality: N/A;   COLONOSCOPY WITH PROPOFOL N/A 01/29/2016   Procedure: COLONOSCOPY WITH PROPOFOL;  Surgeon: Cassie Click, MD;  Location: [facility] ENDOSCOPY;  Service: Endoscopy;  Laterality: N/A;   HERNIA REPAIR  2022   LITHOTRIPSY  2016   UMBILICAL HERNIA REPAIR N/A 11/12/2020   Procedure: HERNIA REPAIR UMBILICAL ADULT, open;  Surgeon: Emmalene Hare, MD;  Location: [facility] ORS;  Service: General;  Laterality: N/A;    Current Outpatient Medications  Medication Sig Dispense Refill   b complex vitamins tablet Take 1 tablet by mouth daily.  cholecalciferol (VITAMIN D3) 25 MCG (1000 UNIT) tablet Take 1,000 Units by mouth daily. With Vitamin K3     MAGNESIUM GLYCINATE PO Take 600 mg by mouth daily.     Omega-3 Fatty Acids (FISH OIL) 1000 MG CAPS Take 2,000 mg by mouth daily.     Probiotic Product (SOLUBLE FIBER/PROBIOTICS PO) Take by mouth.     dicyclomine (BENTYL) 10 MG capsule Take 1 capsule (10 mg total) by mouth 3 (three) times daily before meals. (Patient not taking: Reported on 10/09/2023) 90 capsule 2   Zoster Vaccine Adjuvanted [facility]) injection Inject into the muscle. (Patient not taking: Reported on 10/09/2023) 0.5 mL 0   No current facility-administered medications for this visit.    Allergies:   Aspirin and Sulfa antibiotics   Social History:  The patient  reports that she quit smoking about 13 years ago. Her smoking use included cigarettes. She started smoking about 23 years ago. She has a 5 pack-year smoking history. She has never used smokeless tobacco. She reports current alcohol use. She reports that she does not use drugs.   Family History:   family history includes Breast cancer in her maternal grandmother; Breast cancer (age of onset: 29) in her mother; Cancer in her cousin, maternal grandmother, and mother; Colon cancer (age of onset: 33) in her father; Diabetes in her father; Hypertension in her paternal grandfather; Lung cancer in her father.    Review of Systems: Review of Systems  Constitutional: Negative.   HENT: Negative.    Respiratory: Negative.    Cardiovascular:  Positive for palpitations.       Paroxysmal tachycardia  Gastrointestinal: Negative.   Musculoskeletal: Negative.   Neurological: Negative.   Psychiatric/Behavioral: Negative.    All other systems reviewed and are negative.  PHYSICAL EXAM: VS:  BP 116/78 (BP Location: Right Arm, Patient Position: Sitting, Cuff Size: Large)   Pulse 84   Ht 5\' 2"  (1.575 m)   Wt 263 lb 8 oz (119.5 kg)   SpO2 98%   BMI 48.19 kg/m  , BMI Body mass  index is 48.19 kg/m. GEN: Well nourished, well developed, in no acute distress HEENT: normal Neck: no JVD, carotid bruits, or masses Cardiac: RRR; no murmurs, rubs, or gallops,no edema  Respiratory:  clear to auscultation bilaterally, normal work of breathing GI: soft, nontender, nondistended, + BS MS: no deformity or atrophy Skin: warm and dry, no rash Neuro:  Strength and sensation are intact Psych: euthymic mood, full affect  Recent Labs: 01/17/2023: ALT 20; BUN 21; Creatinine, Ser 0.66; Hemoglobin 13.0; Platelets 220; Potassium 4.6; Sodium 138    Lipid Panel Lab Results  Component Value Date   CHOL 205 (H) 01/17/2023   HDL 64 01/17/2023   LDLCALC 130 (H) 01/17/2023   TRIG 56 01/17/2023    Wt Readings from Last 3 Encounters:  10/09/23 263 lb 8 oz (119.5 kg)  04/20/23 254 lb (115.2 kg)  04/04/23 254 lb 9.6 oz (115.5 kg)    ASSESSMENT AND PLAN:  Problem List Items Addressed This Visit   None Visit Diagnoses       SVT (supraventricular tachycardia) (HCC)    -  Primary   Relevant Orders   EKG 12-Lead (Completed)     Palpitations         PVC (premature ventricular contraction)         Chest discomfort          Paroxysmal tachycardia Reports prior history SVT Episode 1 month ago lasting 10 minutes,  very symptomatic Last episode more than 10 years ago Would prefer not to be on medications for rate/rhythm control.  Does not feel that a pill in the pocket approach would work given symptoms did not last Recommended she consider EKG monitoring device such as Kardia mobile or Apple Watch or something similar to help track rhythm - Zio monitor could be ordered if desired - For increase in frequency of events, could consider Zio monitor, medications, referral to EP  Palpitations/PVC's Prefers not to be on medications at this time For increasing frequency could consider low-dose metoprolol succinate 12.5 daily, could also consider metoprolol tartrate 12.5 twice daily as  needed for breakthrough palpitations  Chest soreness/discomfort Atypical in nature, less likely ischemia For worsening symptoms echocardiogram could be ordered, she will call us  if she would like this ordered  Patient seen in consultation for Dr. Valli Gaw and will be referred back to her office for ongoing care of the issues detailed above  Signed, Juanda Noon, M.D., Ph.D. [facility] [place], [place] [phone]

## 2023-10-09 ENCOUNTER — Ambulatory Visit: Attending: Cardiovascular Disease | Admitting: Cardiovascular Disease

## 2023-10-09 ENCOUNTER — Encounter: Payer: Self-pay | Admitting: Cardiovascular Disease

## 2023-10-09 VITALS — BP 116/78 | HR 84 | Ht 62.0 in | Wt 263.5 lb

## 2023-10-09 DIAGNOSIS — R002 Palpitations: Secondary | ICD-10-CM | POA: Diagnosis not present

## 2023-10-09 DIAGNOSIS — R0789 Other chest pain: Secondary | ICD-10-CM | POA: Diagnosis not present

## 2023-10-09 DIAGNOSIS — I493 Ventricular premature depolarization: Secondary | ICD-10-CM | POA: Diagnosis not present

## 2023-10-09 DIAGNOSIS — I471 Supraventricular tachycardia, unspecified: Secondary | ICD-10-CM | POA: Diagnosis not present

## 2023-10-09 NOTE — Patient Instructions (Addendum)
 Kardia mobile  Medication Instructions:  No changes  If you need a refill on your cardiac medications before your next appointment, please call your pharmacy.   Lab work: No new labs needed  Testing/Procedures: No new testing needed  Follow-Up: At [facility], you and your health needs are our priority.  As part of our continuing mission to provide you with exceptional heart care, we have created designated Provider Care Teams.  These Care Teams include your primary Cardiologist (physician) and Advanced Practice Providers (APPs -  Physician Assistants and Nurse Practitioners) who all work together to provide you with the care you need, when you need it.  You will need a follow up appointment as needed  Providers on your designated Care Team:   Laneta Pintos, NP Varney Gentleman, PA-C Cadence Gennaro Khat, [place]  COVID-19 Vaccine Information can be found at: [email or web address] For questions related to vaccine distribution or appointments, please email [email or web address] or call [phone].

## 2023-11-02 ENCOUNTER — Ambulatory Visit: Admitting: Cardiology

## 2023-11-17 ENCOUNTER — Other Ambulatory Visit (HOSPITAL_COMMUNITY): Payer: Self-pay

## 2023-11-17 ENCOUNTER — Other Ambulatory Visit: Payer: Self-pay

## 2023-11-17 MED ORDER — TOBRAMYCIN 0.3 % OP SOLN
1.0000 [drp] | Freq: Four times a day (QID) | OPHTHALMIC | 0 refills | Status: DC
  Filled 2023-11-17: qty 5, 5d supply, fill #0

## 2023-11-21 DIAGNOSIS — G4733 Obstructive sleep apnea (adult) (pediatric): Secondary | ICD-10-CM | POA: Diagnosis not present

## 2024-02-05 ENCOUNTER — Encounter: Payer: 59 | Admitting: Family Medicine

## 2024-02-09 ENCOUNTER — Encounter: Admitting: Nurse Practitioner

## 2024-02-12 ENCOUNTER — Telehealth: Payer: Self-pay

## 2024-02-12 NOTE — Telephone Encounter (Signed)
 Called and scheduled with Sarah [name] on 02/22/2024.

## 2024-02-12 NOTE — Telephone Encounter (Unsigned)
 Copied from CRM [id number]. Topic: General - Other >> Feb 12, 2024  9:45 AM Gennette ORN wrote: Reason for CRM: Patient is calling in because she got rescheduled but she needs this done by Sept 1st. Please follow up with patient.

## 2024-02-22 ENCOUNTER — Ambulatory Visit: Admitting: Nurse Practitioner

## 2024-02-22 ENCOUNTER — Encounter: Payer: Self-pay | Admitting: Nurse Practitioner

## 2024-02-22 VITALS — BP 116/78 | HR 71 | Temp 97.7°F | Ht 62.0 in | Wt 260.6 lb

## 2024-02-22 DIAGNOSIS — Z0000 Encounter for general adult medical examination without abnormal findings: Secondary | ICD-10-CM | POA: Diagnosis not present

## 2024-02-22 NOTE — Progress Notes (Signed)
 Established Patient Office Visit  Subjective:  Patient ID: Sarah [name], female    DOB: Sep 03, 1970  Age: 53 y.o. MRN: [id number]  CC:  Chief Complaint  Patient presents with   Annual Exam   Discussed the use of a AI scribe software for clinical note transcription with the patient, who gave verbal consent to proceed.  HPI  Sarah [name] presents to the clinic for annual physical exam.  Diet: Patient does eat meat. Trying to consume more fruits and veggies. Patient eat some  fried food. Patient drinks water, diet soda and coffee Exercise: trying to Vaccine  Qol:[id number] Tetanus:2023 COVID: no Shingles: completed 2025 Colonoscopy: 2023, repeat 2028 Pap smear: 2022 Mammogram: 09/19/2023  Family history:  Colon cancer: Father  Breast cancer:Mother  Dentist: every 6 months Ophthalmology: No Hep C screening: completed Tobacco use: No Alcohol use:No  Illicit drugs:No   HPI   Past Medical History:  Diagnosis Date   Acute right lumbar radiculopathy 12/01/2021   (12/01/2021) patient was moving some furniture this past weekend and has developed a right lower extremity pain with radicular components.  Steroid taper pack called in.   Acute right-sided low back pain with right-sided sciatica 12/01/2021   (12/01/2021) pain started after the patient was moving some furniture this past weekend.  Primary pain seems to be that of the lower extremity.  Steroid taper called in.   Acute sinusitis, unspecified 08/09/2021   Acute upper back pain 11/18/2019   Allergy    Annual physical exam 12/09/2016   BMI 40.0-44.9, adult (HCC) 11/17/2021   Cervical facet hypertrophy 12/07/2016   Cervical facet syndrome (HCC) (Left) 12/07/2016   Cervical radiculitis (Left) 12/07/2016   Chicken pox    Chronic hip pain (Left) 03/15/2016   Chronic knee pain (Left) 11/17/2015   Chronic pain    Chronic pain syndrome 10/08/2018   GERD (gastroesophageal reflux disease)    History of kidney stones     Hyperlipidemia    Insomnia 06/14/2019   Kidney stone on left side 01/16/2020   Left lumbar radiculopathy 06/14/2019   Morbid obesity (HCC) 09/20/2015   Musculoskeletal pain 12/07/2016   Myofascial pain syndrome, cervical 12/07/2016   Neurogenic pain 12/07/2016   Personal history of urinary (tract) infections 08/09/2021   PONV (postoperative nausea and vomiting)    Rhomboid muscle strain, initial encounter 11/18/2019   Shingles    age 7   Sleep apnea    uses cpap machine    Surgical site infection 2022   Symptomatic cholelithiasis 01/16/2020   Trigger point with back pain (Rhomboid area) 11/18/2019   Umbilical hernia 01/16/2020   Ventral hernia 01/16/2020    Past Surgical History:  Procedure Laterality Date   ABDOMINAL HYSTERECTOMY  01/2001   DUB h/o abnormal pap ovaries intact age early 4s abnormal pap   CESAREAN SECTION  12/1987   CESAREAN SECTION  05/1993   CHOLECYSTECTOMY     COLONOSCOPY N/A 07/19/2021   Procedure: COLONOSCOPY;  Surgeon: Onita Elspeth Sharper, DO;  Location: [facility] ENDOSCOPY;  Service: Gastroenterology;  Laterality: N/A;   COLONOSCOPY WITH PROPOFOL  N/A 01/29/2016   Procedure: COLONOSCOPY WITH PROPOFOL ;  Surgeon: Lamar ONEIDA Holmes, MD;  Location: [facility] ENDOSCOPY;  Service: Endoscopy;  Laterality: N/A;   HERNIA REPAIR  2022   LITHOTRIPSY  2016   UMBILICAL HERNIA REPAIR N/A 11/12/2020   Procedure: HERNIA REPAIR UMBILICAL ADULT, open;  Surgeon: Desiderio Schanz, MD;  Location: [facility] ORS;  Service: General;  Laterality: N/A;    Family History  Problem Relation Age of Onset   Breast cancer Mother 53   Cancer Mother        breast   Colon cancer Father 72   Lung cancer Father    Diabetes Father    Hypertension Paternal Grandfather    Breast cancer Maternal Grandmother        30's   Cancer Maternal Grandmother        breast    Cancer Cousin        breast     Social History   Socioeconomic History   Marital status: Widowed    Spouse name: Not on file    Number of children: Not on file   Years of education: Not on file   Highest education level: 12th grade  Occupational History   Not on file  Tobacco Use   Smoking status: Former    Current packs/day: 0.00    Average packs/day: 0.5 packs/day for 10.0 years (5.0 ttl pk-yrs)    Types: Cigarettes    Start date: 06/27/2000    Quit date: 06/27/2010    Years since quitting: 13.7   Smokeless tobacco: Never   Tobacco comments:    quit 2015  Vaping Use   Vaping status: Former   Quit date: 02/27/2017   Devices: quit 2018  Substance and Sexual Activity   Alcohol use: Yes    Alcohol/week: 0.0 standard drinks of alcohol    Comment: occasional and very rarely    Drug use: No   Sexual activity: Yes    Partners: Male    Birth control/protection: Surgical    Comment: Husband  Other Topics Concern   Not on file  Social History Narrative   Married but husband of 13 years died 22-Sep-2019 of throat cancer which spread   Employed as a Psychologist, 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   HS education    2 children (daughter and son)    As of 12/13/19 son expecting a baby   Caffeine- Coffee and tea 4-5 cups daily    Social Drivers of Health   Financial Resource Strain: Medium Risk (02/02/2024)   Overall Financial Resource Strain (CARDIA)    Difficulty of Paying Living Expenses: Somewhat hard  Food Insecurity: No Food Insecurity (02/02/2024)   Hunger Vital Sign    Worried About Running Out of Food in the Last Year: Never true    Ran Out of Food in the Last Year: Never true  Transportation Needs: No Transportation Needs (02/02/2024)   PRAPARE - Administrator, Civil Service (Medical): No    Lack of Transportation (Non-Medical): No  Physical Activity: Insufficiently Active (02/02/2024)   Exercise Vital Sign    Days of Exercise per Week: 2 days    Minutes of Exercise per Session: 10 min  Stress: No Stress Concern Present (02/02/2024)   [employer] of Occupational Health - Occupational Stress Questionnaire     Feeling of Stress: Not at all  Social Connections: Moderately Integrated (02/02/2024)   Social Connection and Isolation Panel    Frequency of Communication with Friends and Family: Three times a week    Frequency of Social Gatherings with Friends and Family: Once a week    Attends Religious Services: More than 4 times per year    Active Member of [employer] or Organizations: Yes    Attends Banker Meetings: More than 4 times per year    Marital Status: Widowed  Intimate Partner Violence: Not on file  Outpatient Medications Prior to Visit  Medication Sig Dispense Refill   b complex vitamins tablet Take 1 tablet by mouth daily.     cholecalciferol (VITAMIN D3) 25 MCG (1000 UNIT) tablet Take 1,000 Units by mouth daily. With Vitamin K3     MAGNESIUM  GLYCINATE PO Take 600 mg by mouth daily.     Omega-3 Fatty Acids (FISH OIL) 1000 MG CAPS Take 2,000 mg by mouth daily.     Probiotic Product (SOLUBLE FIBER/PROBIOTICS PO) Take by mouth.     Zoster Vaccine Adjuvanted (SHINGRIX ) injection Inject into the muscle. 0.5 mL 0   dicyclomine  (BENTYL ) 10 MG capsule Take 1 capsule (10 mg total) by mouth 3 (three) times daily before meals. (Patient not taking: Reported on 10/09/2023) 90 capsule 2   tobramycin  (TOBREX ) 0.3 % ophthalmic solution Place 1 drop into the right eye 4 (four) times daily for 5 days. 5 mL 0   No facility-administered medications prior to visit.    Allergies  Allergen Reactions   Aspirin     bleeding   Sulfa Antibiotics Hives    ROS Review of Systems Negative unless indicated in HPI.    Objective:    Physical Exam Constitutional:      Appearance: Normal appearance. She is normal weight.  HENT:     Head: Normocephalic.     Right Ear: Tympanic membrane normal.     Left Ear: Tympanic membrane normal.     Mouth/Throat:     Mouth: Mucous membranes are moist.  Eyes:     Extraocular Movements: Extraocular movements intact.     Conjunctiva/sclera: Conjunctivae  normal.     Pupils: Pupils are equal, round, and reactive to light.  Neck:     Thyroid : No thyroid  mass or thyroid  tenderness.  Cardiovascular:     Rate and Rhythm: Normal rate and regular rhythm.     Pulses: Normal pulses.     Heart sounds: Normal heart sounds. No murmur heard. Pulmonary:     Effort: Pulmonary effort is normal.     Breath sounds: Normal breath sounds.  Abdominal:     General: Bowel sounds are normal.     Palpations: Abdomen is soft. There is no mass.     Tenderness: There is no abdominal tenderness. There is no rebound.  Musculoskeletal:        General: No swelling.     Cervical back: Neck supple. No tenderness.     Right lower leg: No edema.     Left lower leg: No edema.  Skin:    Findings: No bruising, erythema or rash.  Neurological:     General: No focal deficit present.     Mental Status: She is alert and oriented to person, place, and time. Mental status is at baseline.  Psychiatric:        Mood and Affect: Mood normal.        Behavior: Behavior normal.        Thought Content: Thought content normal.        Judgment: Judgment normal.     BP 116/78   Pulse 71   Temp 97.7 F (36.5 C)   Ht 5' 2 (1.575 m)   Wt 260 lb 9.6 oz (118.2 kg)   SpO2 98%   BMI 47.66 kg/m  Wt Readings from Last 3 Encounters:  02/22/24 260 lb 9.6 oz (118.2 kg)  10/09/23 263 lb 8 oz (119.5 kg)  04/20/23 254 lb (115.2 kg)     Health Maintenance  Topic Date Due   Hepatitis B Vaccines 19-59 Average Risk (1 of 3 - 19+ 3-dose series) Never done   Pneumococcal Vaccine: 50+ Years (1 of 1 - PCV) Never done   COVID-19 Vaccine (1 - 2024-25 season) Never done   Influenza Vaccine  09/24/2024 (Originally 01/26/2024)   Mammogram  09/18/2025   Cervical Cancer Screening (HPV/Pap Cotest)  01/15/2026   Colonoscopy  07/19/2026   DTaP/Tdap/Td (3 - Td or Tdap) 01/22/2032   Hepatitis C Screening  Completed   Zoster Vaccines- Shingrix   Completed   HPV VACCINES  Aged Out   Meningococcal B  Vaccine  Aged Out       Topic Date Due   Hepatitis B Vaccines 19-59 Average Risk (1 of 3 - 19+ 3-dose series) Never done    Lab Results  Component Value Date   TSH 2.23 01/21/2022   Lab Results  Component Value Date   WBC 6.6 01/17/2023   HGB 13.0 01/17/2023   HCT 39.2 01/17/2023   MCV 88.7 01/17/2023   PLT 220 01/17/2023   Lab Results  Component Value Date   NA 138 01/17/2023   K 4.6 01/17/2023   CO2 26 01/17/2023   GLUCOSE 112 (H) 01/17/2023   BUN 21 (H) 01/17/2023   CREATININE 0.66 01/17/2023   BILITOT 0.6 01/17/2023   ALKPHOS 60 01/17/2023   AST 17 01/17/2023   ALT 20 01/17/2023   PROT 6.5 01/17/2023   ALBUMIN 3.7 01/17/2023   CALCIUM 8.7 (L) 01/17/2023   ANIONGAP 6 01/17/2023   EGFR 102 02/15/2021   GFR 84.35 01/21/2022   Lab Results  Component Value Date   CHOL 205 (H) 01/17/2023   Lab Results  Component Value Date   HDL 64 01/17/2023   Lab Results  Component Value Date   LDLCALC 130 (H) 01/17/2023   Lab Results  Component Value Date   TRIG 56 01/17/2023   Lab Results  Component Value Date   CHOLHDL 3.2 01/17/2023   Lab Results  Component Value Date   HGBA1C 5.3 01/17/2023      Assessment & Plan:  Preventative health care Assessment & Plan: Encouraged patient to consume a balanced diet and regular exercise regimen. Advised to see an eye doctor and dentist annually.  Will obtain labs as outlined.    Orders: -     CBC with Differential/Platelet; Future -     Comprehensive metabolic panel with GFR; Future -     Lipid panel; Future -     TSH; Future    Follow-up: Return for fasting labs for physical.   Chelsea Aurora, NP

## 2024-02-29 ENCOUNTER — Encounter: Admitting: Nurse Practitioner

## 2024-03-08 ENCOUNTER — Encounter: Admitting: Nurse Practitioner

## 2024-03-14 ENCOUNTER — Encounter: Payer: Self-pay | Admitting: Nurse Practitioner

## 2024-03-14 DIAGNOSIS — Z0000 Encounter for general adult medical examination without abnormal findings: Secondary | ICD-10-CM | POA: Insufficient documentation

## 2024-03-14 NOTE — Assessment & Plan Note (Signed)
 Encouraged patient to consume a balanced diet and regular exercise regimen. Advised to see an eye doctor and dentist annually.  Will obtain labs as outlined.

## 2024-04-10 ENCOUNTER — Other Ambulatory Visit (INDEPENDENT_AMBULATORY_CARE_PROVIDER_SITE_OTHER)

## 2024-04-10 DIAGNOSIS — Z0000 Encounter for general adult medical examination without abnormal findings: Secondary | ICD-10-CM

## 2024-04-10 LAB — CBC WITH DIFFERENTIAL/PLATELET
Basophils Absolute: 0 K/uL (ref 0.0–0.1)
Basophils Relative: 0.5 % (ref 0.0–3.0)
Eosinophils Absolute: 0.3 K/uL (ref 0.0–0.7)
Eosinophils Relative: 3.9 % (ref 0.0–5.0)
HCT: 38.4 % (ref 36.0–46.0)
Hemoglobin: 12.9 g/dL (ref 12.0–15.0)
Lymphocytes Relative: 34.7 % (ref 12.0–46.0)
Lymphs Abs: 2.6 K/uL (ref 0.7–4.0)
MCHC: 33.6 g/dL (ref 30.0–36.0)
MCV: 87 fl (ref 78.0–100.0)
Monocytes Absolute: 0.6 K/uL (ref 0.1–1.0)
Monocytes Relative: 7.3 % (ref 3.0–12.0)
Neutro Abs: 4.1 K/uL (ref 1.4–7.7)
Neutrophils Relative %: 53.6 % (ref 43.0–77.0)
Platelets: 244 K/uL (ref 150.0–400.0)
RBC: 4.41 Mil/uL (ref 3.87–5.11)
RDW: 15.4 % (ref 11.5–15.5)
WBC: 7.6 K/uL (ref 4.0–10.5)

## 2024-04-10 LAB — LIPID PANEL
Cholesterol: 186 mg/dL (ref 0–200)
HDL: 58.2 mg/dL (ref >39.00)
LDL Cholesterol: 112 mg/dL — ABNORMAL HIGH (ref 0–99)
NonHDL: 127.99
Total CHOL/HDL Ratio: 3
Triglycerides: 81 mg/dL (ref 0.0–149.0)
VLDL: 16.2 mg/dL (ref 0.0–40.0)

## 2024-04-10 LAB — COMPREHENSIVE METABOLIC PANEL WITH GFR
ALT: 24 U/L (ref 0–35)
AST: 17 U/L (ref 0–37)
Albumin: 4.1 g/dL (ref 3.5–5.2)
Alkaline Phosphatase: 65 U/L (ref 39–117)
BUN: 18 mg/dL (ref 6–23)
CO2: 28 meq/L (ref 19–32)
Calcium: 8.8 mg/dL (ref 8.4–10.5)
Chloride: 105 meq/L (ref 96–112)
Creatinine, Ser: 0.77 mg/dL (ref 0.40–1.20)
GFR: 86.94 mL/min (ref >60.00)
Glucose, Bld: 114 mg/dL — ABNORMAL HIGH (ref 70–99)
Potassium: 5 meq/L (ref 3.5–5.1)
Sodium: 140 meq/L (ref 135–145)
Total Bilirubin: 0.5 mg/dL (ref 0.2–1.2)
Total Protein: 6.7 g/dL (ref 6.0–8.3)

## 2024-04-10 LAB — TSH: TSH: 2.63 u[IU]/mL (ref 0.35–5.50)

## 2024-04-19 ENCOUNTER — Encounter

## 2024-04-23 ENCOUNTER — Encounter: Payer: Self-pay | Admitting: Cardiovascular Disease

## 2024-05-10 ENCOUNTER — Ambulatory Visit

## 2024-05-10 ENCOUNTER — Ambulatory Visit: Payer: Self-pay | Admitting: Nurse Practitioner

## 2024-05-10 VITALS — BP 110/78 | HR 79 | Temp 98.2°F | Ht 62.0 in | Wt 264.2 lb

## 2024-05-10 DIAGNOSIS — K581 Irritable bowel syndrome with constipation: Secondary | ICD-10-CM | POA: Diagnosis not present

## 2024-05-10 DIAGNOSIS — Z1321 Encounter for screening for nutritional disorder: Secondary | ICD-10-CM | POA: Diagnosis not present

## 2024-05-10 DIAGNOSIS — Z1231 Encounter for screening mammogram for malignant neoplasm of breast: Secondary | ICD-10-CM | POA: Diagnosis not present

## 2024-05-10 DIAGNOSIS — G4733 Obstructive sleep apnea (adult) (pediatric): Secondary | ICD-10-CM

## 2024-05-10 DIAGNOSIS — Z6841 Body Mass Index (BMI) 40.0 and over, adult: Secondary | ICD-10-CM

## 2024-05-10 DIAGNOSIS — E66813 Obesity, class 3: Secondary | ICD-10-CM

## 2024-05-10 NOTE — Assessment & Plan Note (Addendum)
 Struggled with weight loss; cost of injectables prohibitive. Interested in nutritional guidance. Referred to nutritional specialist for dietary guidance. Check A1c, fasting lipid, vitamin D  before her next visit.  Orders:   Hemoglobin A1c; Future   Lipid panel; Future   Comprehensive metabolic panel; Future   Amb ref to Medical Nutrition Therapy-MNT   Vitamin D  (25 hydroxy); Future

## 2024-05-10 NOTE — Assessment & Plan Note (Addendum)
 Last mammogram normal on 09/19/2023, annual screening recommended. Future imaging ordered. Patient recommended to call to schedule an appointment for after 09/18/24.  Orders:   MM 3D SCREENING MAMMOGRAM BILATERAL BREAST; Future

## 2024-05-10 NOTE — Assessment & Plan Note (Addendum)
 Chronic, continue management and f/u with pulmonology.

## 2024-05-10 NOTE — Assessment & Plan Note (Signed)
 Previously seen GI. Symptoms has improved with intake of Pre/probiotics. Encouraged increased dietary fiber intake.

## 2024-05-10 NOTE — Patient Instructions (Signed)
 YOUR MAMMOGRAM IS DUE, PLEASE CALL AND GET THIS SCHEDULED! [facility] - call [phone]

## 2024-05-10 NOTE — Progress Notes (Signed)
 Established Patient Office Visit TOC from Dr. Hope    Subjective  Patient ID: Sarah [name], female    DOB: July 07, 1970  Age: 53 y.o. MRN: [id number]  Chief Complaint  Patient presents with   Transitions Of Care    [facility] from Dr Hope    Discussed the use of AI scribe software for clinical note transcription with the patient, who gave verbal consent to proceed.  History of Present Illness Sarah [name] is a 53 year old female who presents for a transfer of care visit from previous PCP.   Obesity: She struggles with weight management and has previously lost weight using [employer] but found it too expensive to maintain. She does not consume alcohol or smoke. She is interested in pharmacological intervention for weight loss but insurance does not cover for it.   She experiences chronic pain in various joints, including the hip and knee, which she attributes to age, deterioration, and weight. There have been no recent changes in her pain levels.   She has a history of arrhythmia and experienced a significant number of premature ventricular contractions (PVCs) a few weeks ago, which she associates with steroid use. These episodes have not significantly impacted her daily activities. She has seen cardiologist Dr. Gollan in the past, was recommended for a PRN follow up.   She has a history of acid reflux, which has improved recently, with only occasional symptoms triggered by certain foods. She uses over-the-counter antacids like Tums infrequently.   She has been diagnosed with sleep apnea and uses a CPAP machine nightly, managed by [facility].  She has a history of irritable bowel syndrome (IBS), which she manages with pre and probiotics, noting improvement in her symptoms. She struggles with constipation and acknowledges needing more fiber in her diet.  She is currently taking vitamin D  1000 units daily, magnesium  glycinate 600 mg, omega-3 fatty acids, a probiotic, turmeric, ginger,  black pepper combination, and a B complex vitamin.  Reports occasional swelling at the end of the day, managed by walking and wearing compression socks.    ROS As per HPI    Objective:     BP 110/78   Pulse 79   Temp 98.2 F (36.8 C) (Oral)   Ht 5' 2 (1.575 m)   Wt 264 lb 4 oz (119.9 kg)   SpO2 99%   BMI 48.33 kg/m      05/10/2024   10:59 AM 02/22/2024   11:47 AM 07/28/2022    3:03 PM  Depression screen PHQ 2/9  Decreased Interest 0 0 0  Down, Depressed, Hopeless 0 0 0  PHQ - 2 Score 0 0 0  Altered sleeping 0 0 0  Tired, decreased energy 0 0 0  Change in appetite 0 0 0  Feeling bad or failure about yourself  0 0 0  Trouble concentrating 0 0 0  Moving slowly or fidgety/restless 0 0 0  Suicidal thoughts 0 0 0  PHQ-9 Score 0 0  0   Difficult doing work/chores Not difficult at all Not difficult at all Not difficult at all     Data saved with a previous flowsheet row definition      05/10/2024   10:59 AM 02/22/2024   11:47 AM 07/28/2022    3:04 PM  GAD 7 : Generalized Anxiety Score  Nervous, Anxious, on Edge 0 0 0  Control/stop worrying 0 0 0  Worry too much - different things 0 0 0  Trouble relaxing 0  0 0  Restless 0 0 0  Easily annoyed or irritable 0 0 0  Afraid - awful might happen 0 0 0  Total GAD 7 Score 0 0 0  Anxiety Difficulty Not difficult at all Not difficult at all Not difficult at all      05/10/2024   10:59 AM 02/22/2024   11:47 AM 07/28/2022    3:03 PM  Depression screen PHQ 2/9  Decreased Interest 0 0 0  Down, Depressed, Hopeless 0 0 0  PHQ - 2 Score 0 0 0  Altered sleeping 0 0 0  Tired, decreased energy 0 0 0  Change in appetite 0 0 0  Feeling bad or failure about yourself  0 0 0  Trouble concentrating 0 0 0  Moving slowly or fidgety/restless 0 0 0  Suicidal thoughts 0 0 0  PHQ-9 Score 0 0  0   Difficult doing work/chores Not difficult at all Not difficult at all Not difficult at all     Data saved with a previous flowsheet row  definition      05/10/2024   10:59 AM 02/22/2024   11:47 AM 07/28/2022    3:04 PM  GAD 7 : Generalized Anxiety Score  Nervous, Anxious, on Edge 0 0 0  Control/stop worrying 0 0 0  Worry too much - different things 0 0 0  Trouble relaxing 0 0 0  Restless 0 0 0  Easily annoyed or irritable 0 0 0  Afraid - awful might happen 0 0 0  Total GAD 7 Score 0 0 0  Anxiety Difficulty Not difficult at all Not difficult at all Not difficult at all   SDOH Screenings   Food Insecurity: No Food Insecurity (02/02/2024)  Housing: Low Risk  (02/02/2024)  Transportation Needs: No Transportation Needs (02/02/2024)  Depression (PHQ2-9): Low Risk  (05/10/2024)  Financial Resource Strain: Medium Risk (02/02/2024)  Physical Activity: Insufficiently Active (02/02/2024)  Social Connections: Moderately Integrated (02/02/2024)  Stress: No Stress Concern Present (02/02/2024)  Tobacco Use: Medium Risk (05/10/2024)     Physical Exam Constitutional:      Appearance: Normal appearance. She is obese.  HENT:     Head: Normocephalic and atraumatic.     Right Ear: Tympanic membrane normal.     Left Ear: Tympanic membrane normal.     Mouth/Throat:     Mouth: Mucous membranes are moist.  Neck:     Thyroid : No thyroid  mass or thyroid  tenderness.  Cardiovascular:     Rate and Rhythm: Normal rate and regular rhythm.     Heart sounds: No murmur heard. Pulmonary:     Effort: Pulmonary effort is normal.     Breath sounds: Normal breath sounds. No wheezing.  Abdominal:     General: Abdomen is protuberant. Bowel sounds are normal.     Palpations: Abdomen is soft.     Tenderness: There is no abdominal tenderness.  Musculoskeletal:     Cervical back: Neck supple. No rigidity or tenderness.     Right lower leg: No edema.     Left lower leg: No edema.  Skin:    General: Skin is warm.  Neurological:     Mental Status: She is alert and oriented to person, place, and time.  Psychiatric:        Mood and Affect: Mood normal.         Behavior: Behavior normal. Behavior is cooperative.        No results found for any visits on 05/10/24.  The 10-year ASCVD risk score (Arnett DK, et al., 2019) is: 1.3%     Assessment & Plan:  Patient is a pleasant 53 year old female presenting to establish care. Assessment & Plan Class 3 severe obesity due to excess calories without serious comorbidity with body mass index (BMI) of 40.0 to 44.9 in adult [facility]) Struggled with weight loss; cost of injectables prohibitive. Interested in nutritional guidance. Referred to nutritional specialist for dietary guidance. Check A1c, fasting lipid, vitamin D  before her next visit.  Orders:   Hemoglobin A1c; Future   Lipid panel; Future   Comprehensive metabolic panel; Future   Amb ref to Medical Nutrition Therapy-MNT   Vitamin D  (25 hydroxy); Future  Breast cancer screening by mammogram Last mammogram normal on 09/19/2023, annual screening recommended. Future imaging ordered. Patient recommended to call to schedule an appointment for after 09/18/24.  Orders:   MM 3D SCREENING MAMMOGRAM BILATERAL BREAST; Future  Encounter for screening for nutritional disorder Reviewed previous B12 level from 01/17/23, ideal goal B12 around 400 pg/ml. She is already taking vitamin B complex, continue. Repeat B12 with her next lab.  Orders:   B12; Future  OSA on CPAP Chronic, continue management and f/u with pulmonology.     Irritable bowel syndrome with constipation Previously seen GI. Symptoms has improved with intake of Pre/probiotics. Encouraged increased dietary fiber intake.     Return for Annual physical after 02/21/25, before 02/25/25, fasting labs couple of days before appointment .   Luke Shade, MD

## 2024-05-10 NOTE — Assessment & Plan Note (Addendum)
 Reviewed previous B12 level from 01/17/23, ideal goal B12 around 400 pg/ml. She is already taking vitamin B complex, continue. Repeat B12 with her next lab.  Orders:   B12; Future

## 2024-06-06 ENCOUNTER — Other Ambulatory Visit: Payer: Self-pay

## 2024-06-06 ENCOUNTER — Ambulatory Visit: Payer: Self-pay

## 2024-06-06 ENCOUNTER — Ambulatory Visit
Admission: RE | Admit: 2024-06-06 | Discharge: 2024-06-06 | Disposition: A | Discharge status: Home / Self Care | Source: Ambulatory Visit | Attending: Emergency Medicine | Admitting: Emergency Medicine

## 2024-06-06 VITALS — BP 132/73 | HR 90 | Temp 98.4°F | Resp 18

## 2024-06-06 DIAGNOSIS — J0140 Acute pansinusitis, unspecified: Secondary | ICD-10-CM | POA: Diagnosis not present

## 2024-06-06 MED ORDER — AMOXICILLIN-POT CLAVULANATE 875-125 MG PO TABS
1.0000 | ORAL_TABLET | Freq: Two times a day (BID) | ORAL | 0 refills | Status: AC
  Filled 2024-06-06: qty 14, 7d supply, fill #0

## 2024-06-06 MED ORDER — FLUCONAZOLE 150 MG PO TABS
150.0000 mg | ORAL_TABLET | ORAL | 0 refills | Status: AC | PRN
  Filled 2024-06-06: qty 2, 6d supply, fill #0

## 2024-06-06 MED ORDER — IPRATROPIUM BROMIDE 0.03 % NA SOLN
2.0000 | Freq: Two times a day (BID) | NASAL | 0 refills | Status: AC
  Filled 2024-06-06: qty 30, 75d supply, fill #0

## 2024-06-06 NOTE — ED Provider Notes (Signed)
 Sarah [name]    CSN: [id number] Arrival date & time: 06/06/24  0807      History   Chief Complaint Chief Complaint  Patient presents with   Ear Fullness    Sinus infection - Entered by patient   Facial Pain   Headache    HPI Sarah [name] is a 53 y.o. female.   Patient presents for evaluation of nasal congestion, sinus pain and pressure to the center of the face, a nonproductive cough only occurring at nighttime, nausea without vomiting, intermittent headaches and dizziness present for 7 days.  Last occurrence of dizziness 4 days ago.  Headache has been constant.  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  Has attempted use of Tylenol  and home remedies.  Denies shortness of breath wheezing or fever.  Denies respiratory history, non-smoker.    Past Medical History:  Diagnosis Date   Acute frontal sinusitis 01/08/2023   Acute right lumbar radiculopathy 12/01/2021   (12/01/2021) patient was moving some furniture this past weekend and has developed a right lower extremity pain with radicular components.  Steroid taper pack called in.   Acute right-sided low back pain with right-sided sciatica 12/01/2021   (12/01/2021) pain started after the patient was moving some furniture this past weekend.  Primary pain seems to be that of the lower extremity.  Steroid taper called in.   Acute sinusitis, unspecified 08/09/2021   Acute upper back pain 11/18/2019   Allergy    Annual physical exam 12/09/2016   BMI 40.0-44.9, adult (HCC) 11/17/2021   Cervical facet hypertrophy 12/07/2016   Cervical facet syndrome (HCC) (Left) 12/07/2016   Cervical radiculitis (Left) 12/07/2016   Chicken pox    Chronic hip pain (Left) 03/15/2016   Chronic knee pain (Left) 11/17/2015   Chronic pain    Chronic pain syndrome 10/08/2018   GERD (gastroesophageal reflux disease)    History of kidney stones    Hyperlipidemia    Insomnia 06/14/2019   Kidney stone on left side 01/16/2020   Left  lumbar radiculopathy 06/14/2019   Morbid obesity (HCC) 09/20/2015   Musculoskeletal pain 12/07/2016   Myofascial pain syndrome, cervical 12/07/2016   Neurogenic pain 12/07/2016   Personal history of urinary (tract) infections 08/09/2021   PONV (postoperative nausea and vomiting)    Rhomboid muscle strain, initial encounter 11/18/2019   Shingles    age 53   Sleep apnea    uses cpap machine    Surgical site infection 2022   Symptomatic cholelithiasis 01/16/2020   Trigger point with back pain (Rhomboid area) 11/18/2019   Umbilical hernia 01/16/2020   Ventral hernia 01/16/2020    Patient Active Problem List   Diagnosis Date Noted   Irritable bowel syndrome with constipation 05/10/2024   Encounter for screening for nutritional disorder 07/30/2022   Allergy, unspecified, initial encounter 08/09/2021   Diverticulosis 01/16/2020   OSA on CPAP 12/15/2019   DDD (degenerative disc disease), lumbar 06/14/2019   Spondylosis 06/14/2019   Class 3 severe obesity due to excess calories without serious comorbidity with body mass index (BMI) of 40.0 to 44.9 in adult [facility]) 12/15/2017   Hyperlipidemia 12/15/2017   Annual physical exam 12/09/2016   Osteoarthritis of cervical spine 12/07/2016   History of nephrolithiasis 01/26/2016   Gastro-esophageal reflux disease without esophagitis 09/20/2015    Past Surgical History:  Procedure Laterality Date   ABDOMINAL HYSTERECTOMY  01/2001   DUB h/o abnormal pap ovaries intact age early 71s abnormal pap   CESAREAN SECTION  12/1987   CESAREAN SECTION  05/1993   CHOLECYSTECTOMY     COLONOSCOPY N/A 07/19/2021   Procedure: COLONOSCOPY;  Surgeon: Onita Elspeth Sharper, DO;  Location: [facility] ENDOSCOPY;  Service: Gastroenterology;  Laterality: N/A;   COLONOSCOPY WITH PROPOFOL  N/A 01/29/2016   Procedure: COLONOSCOPY WITH PROPOFOL ;  Surgeon: Lamar ONEIDA Holmes, MD;  Location: [facility] ENDOSCOPY;  Service: Endoscopy;  Laterality: N/A;   HERNIA REPAIR  2022    LITHOTRIPSY  2016   UMBILICAL HERNIA REPAIR N/A 11/12/2020   Procedure: HERNIA REPAIR UMBILICAL ADULT, open;  Surgeon: Desiderio Schanz, MD;  Location: [facility] ORS;  Service: General;  Laterality: N/A;    OB History   No obstetric history on file.      Home Medications    Prior to Admission medications  Medication Sig Start Date End Date Taking? Authorizing Provider  b complex vitamins tablet Take 1 tablet by mouth daily.    [provider]  cholecalciferol (VITAMIN D3) 25 MCG (1000 UNIT) tablet Take 1,000 Units by mouth daily. With Vitamin K3    [provider]  MAGNESIUM  GLYCINATE PO Take 600 mg by mouth daily.    [provider]  Omega-3 Fatty Acids (FISH OIL) 1000 MG CAPS Take 2,000 mg by mouth daily.    [provider]  Probiotic Product (SOLUBLE FIBER/PROBIOTICS PO) Take by mouth.    [provider]  TURMERIC-GINGER-BLACK PEPPER  03/15/24   [provider]    Family History Family History  Problem Relation Age of Onset   Breast cancer Mother 14   Cancer Mother        breast   Colon cancer Father 30   Lung cancer Father    Diabetes Father    Hypertension Paternal Grandfather    Breast cancer Maternal Grandmother        30's   Cancer Maternal Grandmother        breast    Cancer Cousin        breast     Social History Social History[1]   Allergies   Aspirin and Sulfa antibiotics   Review of Systems Review of Systems  Constitutional: Negative.   HENT:  Positive for congestion, sinus pressure and sinus pain. Negative for dental problem, drooling, ear discharge, ear pain, facial swelling, hearing loss, mouth sores, nosebleeds, postnasal drip, rhinorrhea, sneezing, sore throat, tinnitus, trouble swallowing and voice change.   Respiratory:  Positive for cough. Negative for apnea, choking, chest tightness, shortness of breath, wheezing and stridor.   Gastrointestinal:  Positive for nausea. Negative for abdominal  distention, abdominal pain, anal bleeding, blood in stool, constipation, diarrhea, rectal pain and vomiting.  Neurological:  Positive for headaches. Negative for dizziness, tremors, seizures, syncope, facial asymmetry, speech difficulty, weakness, light-headedness and numbness.     Physical Exam Triage Vital Signs ED Triage Vitals  Encounter Vitals Group     BP 06/06/24 0825 132/73     Girls Systolic BP Percentile --      Girls Diastolic BP Percentile --      Boys Systolic BP Percentile --      Boys Diastolic BP Percentile --      Pulse Rate 06/06/24 0825 90     Resp 06/06/24 0825 18     Temp 06/06/24 0825 98.4 F (36.9 C)     Temp Source 06/06/24 0825 Oral     SpO2 06/06/24 0825 98 %     Weight --      Height --  Head Circumference --      Peak Flow --      Pain Score 06/06/24 0828 4     Pain Loc --      Pain Education --      Exclude from Growth Chart --    No data found.  Updated Vital Signs BP 132/73 (BP Location: Left Arm)   Pulse 90   Temp 98.4 F (36.9 C) (Oral)   Resp 18   SpO2 98%   Visual Acuity Right Eye Distance:   Left Eye Distance:   Bilateral Distance:    Right Eye Near:   Left Eye Near:    Bilateral Near:     Physical Exam Constitutional:      Appearance: Normal appearance.  HENT:     Right Ear: Tympanic membrane, ear canal and external ear normal.     Left Ear: Tympanic membrane, ear canal and external ear normal.     Nose: Congestion present.     Right Sinus: Maxillary sinus tenderness and frontal sinus tenderness present.     Left Sinus: Maxillary sinus tenderness and frontal sinus tenderness present.     Mouth/Throat:     Pharynx: No oropharyngeal exudate or posterior oropharyngeal erythema.  Eyes:     Extraocular Movements: Extraocular movements intact.  Cardiovascular:     Rate and Rhythm: Normal rate and regular rhythm.     Pulses: Normal pulses.     Heart sounds: Normal heart sounds.  Pulmonary:     Effort: Pulmonary effort  is normal.     Breath sounds: Normal breath sounds.  Musculoskeletal:     Cervical back: Normal range of motion and neck supple.  Lymphadenopathy:     Cervical: No cervical adenopathy.  Neurological:     Mental Status: She is alert and oriented to person, place, and time. Mental status is at baseline.      UC Treatments / Results  Labs (all labs ordered are listed, but only abnormal results are displayed) Labs Reviewed - No data to display  EKG   Radiology No results found.  Procedures Procedures (including critical care time)  Medications Ordered in UC Medications - No data to display  Initial Impression / Assessment and Plan / UC Course  I have reviewed the triage vital signs and the nursing notes.  Pertinent labs & imaging results that were available during my care of the patient were reviewed by me and considered in my medical decision making (see chart for details).  Acute nonrecurrent pansinusitis   Patient is in no signs of distress nor toxic appearing.  Vital signs are stable.  Low suspicion for pneumonia, pneumothorax or bronchitis and therefore will defer imaging.  Presentation consistent with a sinusitis, present for 7 days empirically placed on Augmentin  as well as prescribed Atrovent nasal spray, declined use of steroids.May use additional over-the-counter medications as needed for supportive care.  May follow-up with urgent care as needed if symptoms persist or worsen.   Final Clinical Impressions(s) / UC Diagnoses   Final diagnoses:  None   Discharge Instructions   None    ED Prescriptions   None    PDMP not reviewed this encounter.     [1]  Social History Tobacco Use   Smoking status: Former    Current packs/day: 0.00    Average packs/day: 0.5 packs/day for 10.0 years (5.0 ttl pk-yrs)    Types: Cigarettes    Start date: 06/27/2000    Quit date: 06/27/2010  Years since quitting: 13.9   Smokeless tobacco: Never   Tobacco comments:     quit 2015  Vaping Use   Vaping status: Former   Quit date: 02/27/2017   Devices: quit 2018  Substance Use Topics   Alcohol use: Not Currently   Drug use: No     Teresa Shelba SAUNDERS, NP 06/06/24 [phone]

## 2024-06-06 NOTE — Discharge Instructions (Signed)
 Today you are being treated for a sinus infection  Begin Augmentin  twice daily for 7 days for treatment of bacteria causing symptoms to linger  May use nasal spray twice daily as needed for additional comfort    You can take Tylenol  and/or Ibuprofen  as needed for fever reduction and pain relief.   For cough: honey 1/2 to 1 teaspoon (you can dilute the honey in water or another fluid).  You can also use guaifenesin and dextromethorphan for cough. You can use a humidifier for chest congestion and cough.  If you don't have a humidifier, you can sit in the bathroom with the hot shower running.      For sore throat: try warm salt water gargles, cepacol lozenges, throat spray, warm tea or water with lemon/honey, popsicles or ice, or OTC cold relief medicine for throat discomfort.   For congestion: take a daily anti-histamine like Zyrtec, Claritin, and a oral decongestant, such as pseudoephedrine.  You can also use Flonase 1-2 sprays in each nostril daily.   It is important to stay hydrated: drink plenty of fluids (water, gatorade/powerade/pedialyte, juices, or teas) to keep your throat moisturized and help further relieve irritation/discomfort.

## 2024-06-06 NOTE — ED Triage Notes (Signed)
 Patient reports left ear fullness, sinus pressure and headache x 1 week. Patient has taken Tylenol  with mild relief. Rates pain 4/10.

## 2024-06-19 DIAGNOSIS — G4733 Obstructive sleep apnea (adult) (pediatric): Secondary | ICD-10-CM | POA: Diagnosis not present

## 2024-07-19 ENCOUNTER — Encounter: Admitting: Dietician

## 2024-08-09 ENCOUNTER — Encounter: Admitting: Dietician

## 2025-02-20 ENCOUNTER — Other Ambulatory Visit

## 2025-02-24 ENCOUNTER — Encounter
